# Patient Record
Sex: Male | Born: 1955 | Race: White | Hispanic: No | Smoking: Former smoker
Health system: Southern US, Community
[De-identification: ages and names within clinical notes are randomized; demographics above are authoritative.]

## PROBLEM LIST (undated history)

## (undated) DIAGNOSIS — I2510 Atherosclerotic heart disease of native coronary artery without angina pectoris: Secondary | ICD-10-CM

## (undated) DIAGNOSIS — I10 Essential (primary) hypertension: Secondary | ICD-10-CM

## (undated) DIAGNOSIS — J449 Chronic obstructive pulmonary disease, unspecified: Secondary | ICD-10-CM

## (undated) DIAGNOSIS — J45909 Unspecified asthma, uncomplicated: Secondary | ICD-10-CM

## (undated) DIAGNOSIS — E785 Hyperlipidemia, unspecified: Secondary | ICD-10-CM

## (undated) DIAGNOSIS — E669 Obesity, unspecified: Secondary | ICD-10-CM

## (undated) DIAGNOSIS — M1990 Unspecified osteoarthritis, unspecified site: Secondary | ICD-10-CM

## (undated) DIAGNOSIS — G8929 Other chronic pain: Secondary | ICD-10-CM

## (undated) DIAGNOSIS — M79606 Pain in leg, unspecified: Secondary | ICD-10-CM

## (undated) DIAGNOSIS — E039 Hypothyroidism, unspecified: Secondary | ICD-10-CM

## (undated) DIAGNOSIS — G2581 Restless legs syndrome: Secondary | ICD-10-CM

## (undated) DIAGNOSIS — F17201 Nicotine dependence, unspecified, in remission: Secondary | ICD-10-CM

## (undated) DIAGNOSIS — I679 Cerebrovascular disease, unspecified: Secondary | ICD-10-CM

## (undated) DIAGNOSIS — M549 Dorsalgia, unspecified: Secondary | ICD-10-CM

## (undated) HISTORY — DX: Hyperlipidemia, unspecified: E78.5

## (undated) HISTORY — DX: Nicotine dependence, unspecified, in remission: F17.201

## (undated) HISTORY — PX: CATARACT EXTRACTION: SUR2

## (undated) HISTORY — DX: Atherosclerotic heart disease of native coronary artery without angina pectoris: I25.10

## (undated) HISTORY — PX: LUMBAR SPINE SURGERY: SHX701

## (undated) HISTORY — DX: Restless legs syndrome: G25.81

## (undated) HISTORY — DX: Unspecified osteoarthritis, unspecified site: M19.90

## (undated) HISTORY — DX: Essential (primary) hypertension: I10

## (undated) HISTORY — DX: Hypothyroidism, unspecified: E03.9

## (undated) HISTORY — DX: Chronic obstructive pulmonary disease, unspecified: J44.9

## (undated) HISTORY — DX: Cerebrovascular disease, unspecified: I67.9

## (undated) HISTORY — DX: Obesity, unspecified: E66.9

---

## 1997-12-15 ENCOUNTER — Inpatient Hospital Stay (HOSPITAL_COMMUNITY): Admission: RE | Admit: 1997-12-15 | Discharge: 1997-12-21 | Payer: Self-pay | Admitting: Neurosurgery

## 2000-08-13 HISTORY — PX: CORONARY ARTERY BYPASS GRAFT: SHX141

## 2000-09-23 ENCOUNTER — Encounter: Payer: Self-pay | Admitting: Neurosurgery

## 2000-09-23 ENCOUNTER — Ambulatory Visit (HOSPITAL_COMMUNITY): Admission: RE | Admit: 2000-09-23 | Discharge: 2000-09-23 | Payer: Self-pay | Admitting: Neurosurgery

## 2001-02-01 ENCOUNTER — Encounter: Payer: Self-pay | Admitting: Emergency Medicine

## 2001-02-01 ENCOUNTER — Inpatient Hospital Stay (HOSPITAL_COMMUNITY): Admission: EM | Admit: 2001-02-01 | Discharge: 2001-02-11 | Payer: Self-pay | Admitting: Cardiovascular Disease

## 2001-02-02 ENCOUNTER — Encounter: Payer: Self-pay | Admitting: Thoracic Surgery (Cardiothoracic Vascular Surgery)

## 2001-02-03 ENCOUNTER — Encounter: Payer: Self-pay | Admitting: Thoracic Surgery (Cardiothoracic Vascular Surgery)

## 2001-02-04 ENCOUNTER — Encounter: Payer: Self-pay | Admitting: Thoracic Surgery (Cardiothoracic Vascular Surgery)

## 2001-02-05 ENCOUNTER — Encounter: Payer: Self-pay | Admitting: Thoracic Surgery (Cardiothoracic Vascular Surgery)

## 2001-02-06 ENCOUNTER — Encounter: Payer: Self-pay | Admitting: Thoracic Surgery (Cardiothoracic Vascular Surgery)

## 2001-02-19 ENCOUNTER — Ambulatory Visit (HOSPITAL_COMMUNITY): Admission: RE | Admit: 2001-02-19 | Discharge: 2001-02-19 | Payer: Self-pay | Admitting: Cardiology

## 2001-05-20 ENCOUNTER — Ambulatory Visit (HOSPITAL_COMMUNITY): Admission: RE | Admit: 2001-05-20 | Discharge: 2001-05-20 | Payer: Self-pay | Admitting: Cardiology

## 2002-01-23 ENCOUNTER — Encounter: Payer: Self-pay | Admitting: Emergency Medicine

## 2002-01-23 ENCOUNTER — Emergency Department (HOSPITAL_COMMUNITY): Admission: EM | Admit: 2002-01-23 | Discharge: 2002-01-23 | Payer: Self-pay | Admitting: Emergency Medicine

## 2003-06-21 ENCOUNTER — Emergency Department (HOSPITAL_COMMUNITY): Admission: EM | Admit: 2003-06-21 | Discharge: 2003-06-21 | Payer: Self-pay | Admitting: Emergency Medicine

## 2003-06-22 ENCOUNTER — Encounter: Payer: Self-pay | Admitting: Cardiology

## 2003-08-14 HISTORY — PX: CAROTID ENDARTERECTOMY: SUR193

## 2004-02-22 ENCOUNTER — Ambulatory Visit (HOSPITAL_COMMUNITY): Admission: RE | Admit: 2004-02-22 | Discharge: 2004-02-22 | Payer: Self-pay | Admitting: Ophthalmology

## 2004-04-25 ENCOUNTER — Ambulatory Visit (HOSPITAL_COMMUNITY): Admission: RE | Admit: 2004-04-25 | Discharge: 2004-04-25 | Payer: Self-pay | Admitting: Cardiology

## 2004-05-12 ENCOUNTER — Ambulatory Visit (HOSPITAL_COMMUNITY): Admission: RE | Admit: 2004-05-12 | Discharge: 2004-05-12 | Payer: Self-pay | Admitting: *Deleted

## 2004-06-14 ENCOUNTER — Encounter: Admission: RE | Admit: 2004-06-14 | Discharge: 2004-06-14 | Payer: Self-pay | Admitting: *Deleted

## 2004-06-16 ENCOUNTER — Ambulatory Visit (HOSPITAL_COMMUNITY): Admission: RE | Admit: 2004-06-16 | Discharge: 2004-06-16 | Payer: Self-pay | Admitting: *Deleted

## 2004-06-22 ENCOUNTER — Encounter (INDEPENDENT_AMBULATORY_CARE_PROVIDER_SITE_OTHER): Payer: Self-pay | Admitting: *Deleted

## 2004-06-22 ENCOUNTER — Inpatient Hospital Stay (HOSPITAL_COMMUNITY): Admission: RE | Admit: 2004-06-22 | Discharge: 2004-06-23 | Payer: Self-pay | Admitting: *Deleted

## 2005-10-23 ENCOUNTER — Ambulatory Visit: Payer: Self-pay | Admitting: Internal Medicine

## 2005-10-30 ENCOUNTER — Ambulatory Visit: Payer: Self-pay | Admitting: Internal Medicine

## 2005-10-30 ENCOUNTER — Ambulatory Visit (HOSPITAL_COMMUNITY): Admission: RE | Admit: 2005-10-30 | Discharge: 2005-10-30 | Payer: Self-pay | Admitting: Internal Medicine

## 2007-08-12 ENCOUNTER — Emergency Department (HOSPITAL_COMMUNITY): Admission: EM | Admit: 2007-08-12 | Discharge: 2007-08-12 | Payer: Self-pay | Admitting: Emergency Medicine

## 2007-09-02 ENCOUNTER — Ambulatory Visit: Payer: Self-pay | Admitting: Cardiology

## 2007-09-02 LAB — CONVERTED CEMR LAB
ALT: 29 units/L
AST: 15 units/L
Alkaline Phosphatase: 37 units/L
BUN: 22 mg/dL
CO2: 22 meq/L
Calcium: 9.4 mg/dL
Chloride: 100 meq/L
Creatinine, Ser: 1.14 mg/dL
Glucose, Bld: 127 mg/dL
HCT: 42.5 %
Hemoglobin: 14.1 g/dL
MCV: 92 fL
Platelets: 248 10*3/uL
Potassium: 5.2 meq/L
Sodium: 138 meq/L
Total Bilirubin: 0.6 mg/dL
WBC: 10.5 10*3/uL

## 2007-10-02 ENCOUNTER — Encounter: Payer: Self-pay | Admitting: Cardiology

## 2007-10-02 ENCOUNTER — Ambulatory Visit: Payer: Self-pay | Admitting: Cardiovascular Disease

## 2007-10-02 LAB — CONVERTED CEMR LAB
Cholesterol: 138 mg/dL
HDL: 38 mg/dL
LDL (calc): 66 mg/dL
Triglyceride fasting, serum: 329 mg/dL

## 2008-11-05 ENCOUNTER — Ambulatory Visit: Payer: Self-pay | Admitting: Cardiology

## 2008-11-08 ENCOUNTER — Ambulatory Visit (HOSPITAL_COMMUNITY): Admission: RE | Admit: 2008-11-08 | Discharge: 2008-11-08 | Payer: Self-pay | Admitting: Cardiology

## 2008-11-30 ENCOUNTER — Ambulatory Visit (HOSPITAL_COMMUNITY): Admission: RE | Admit: 2008-11-30 | Discharge: 2008-11-30 | Payer: Self-pay | Admitting: Family Medicine

## 2009-02-11 DIAGNOSIS — N183 Chronic kidney disease, stage 3 (moderate): Secondary | ICD-10-CM

## 2009-02-11 DIAGNOSIS — E1122 Type 2 diabetes mellitus with diabetic chronic kidney disease: Secondary | ICD-10-CM

## 2009-12-18 ENCOUNTER — Encounter: Payer: Self-pay | Admitting: Cardiology

## 2009-12-18 DIAGNOSIS — J449 Chronic obstructive pulmonary disease, unspecified: Secondary | ICD-10-CM | POA: Insufficient documentation

## 2009-12-19 ENCOUNTER — Encounter (INDEPENDENT_AMBULATORY_CARE_PROVIDER_SITE_OTHER): Payer: Self-pay | Admitting: *Deleted

## 2009-12-19 ENCOUNTER — Ambulatory Visit: Payer: Self-pay | Admitting: Cardiology

## 2009-12-22 ENCOUNTER — Encounter (INDEPENDENT_AMBULATORY_CARE_PROVIDER_SITE_OTHER): Payer: Self-pay | Admitting: *Deleted

## 2009-12-22 LAB — CONVERTED CEMR LAB
OCCULT 1: NEGATIVE
OCCULT 2: NEGATIVE
OCCULT 3: POSITIVE

## 2009-12-26 ENCOUNTER — Encounter: Payer: Self-pay | Admitting: Cardiology

## 2009-12-26 LAB — CONVERTED CEMR LAB
ALT: 20 units/L (ref 0–53)
AST: 16 units/L (ref 0–37)
Albumin: 4.6 g/dL (ref 3.5–5.2)
Alkaline Phosphatase: 25 units/L — ABNORMAL LOW (ref 39–117)
BUN: 24 mg/dL — ABNORMAL HIGH (ref 6–23)
Basophils Absolute: 0 10*3/uL (ref 0.0–0.1)
Basophils Relative: 0 % (ref 0–1)
CO2: 28 meq/L (ref 19–32)
Calcium: 9.7 mg/dL (ref 8.4–10.5)
Chloride: 103 meq/L (ref 96–112)
Cholesterol: 128 mg/dL (ref 0–200)
Creatinine, Ser: 1.44 mg/dL (ref 0.40–1.50)
Eosinophils Absolute: 0.2 10*3/uL (ref 0.0–0.7)
Eosinophils Relative: 3 % (ref 0–5)
Glucose, Bld: 89 mg/dL (ref 70–99)
HCT: 41 % (ref 39.0–52.0)
HDL: 38 mg/dL — ABNORMAL LOW (ref >39)
Hemoglobin: 12.9 g/dL — ABNORMAL LOW (ref 13.0–17.0)
LDL Cholesterol: 51 mg/dL (ref 0–99)
Lymphocytes Relative: 50 % — ABNORMAL HIGH (ref 12–46)
Lymphs Abs: 4 10*3/uL (ref 0.7–4.0)
MCHC: 31.5 g/dL (ref 30.0–36.0)
MCV: 93.8 fL (ref 78.0–100.0)
Monocytes Absolute: 0.7 10*3/uL (ref 0.1–1.0)
Monocytes Relative: 8 % (ref 3–12)
Neutro Abs: 3.1 10*3/uL (ref 1.7–7.7)
Neutrophils Relative %: 39 % — ABNORMAL LOW (ref 43–77)
Platelets: 222 10*3/uL (ref 150–400)
Potassium: 4.4 meq/L (ref 3.5–5.3)
RBC: 4.37 M/uL (ref 4.22–5.81)
RDW: 13.3 % (ref 11.5–15.5)
Sodium: 141 meq/L (ref 135–145)
Total Bilirubin: 0.3 mg/dL (ref 0.3–1.2)
Total CHOL/HDL Ratio: 3.4
Total Protein: 7.2 g/dL (ref 6.0–8.3)
Triglycerides: 193 mg/dL — ABNORMAL HIGH (ref <150)
VLDL: 39 mg/dL (ref 0–40)
WBC: 8 10*3/uL (ref 4.0–10.5)

## 2009-12-27 ENCOUNTER — Encounter (INDEPENDENT_AMBULATORY_CARE_PROVIDER_SITE_OTHER): Payer: Self-pay | Admitting: *Deleted

## 2009-12-29 ENCOUNTER — Encounter (INDEPENDENT_AMBULATORY_CARE_PROVIDER_SITE_OTHER): Payer: Self-pay | Admitting: *Deleted

## 2009-12-29 ENCOUNTER — Ambulatory Visit: Payer: Self-pay | Admitting: Cardiology

## 2010-01-06 ENCOUNTER — Ambulatory Visit: Payer: Self-pay | Admitting: Cardiology

## 2010-01-10 ENCOUNTER — Encounter (INDEPENDENT_AMBULATORY_CARE_PROVIDER_SITE_OTHER): Payer: Self-pay | Admitting: *Deleted

## 2010-01-10 LAB — CONVERTED CEMR LAB
OCCULT 1: NEGATIVE
OCCULT 2: NEGATIVE
OCCULT 3: POSITIVE

## 2010-09-14 NOTE — Letter (Signed)
Summary: [place] Results Engineer, agricultural at [facility]  [address], [place] [address]   Phone: [phone]  Fax: [phone]      Dec 29, 2009 MRN: [id number]   [name] [address] [place], [place]  [address]   Dear [name],  Your test ordered by Selena Batten has been reviewed by your physician (or physician assistant) and was found to be normal or stable. Your physician (or physician assistant) felt no changes were needed at this time.  ____ Echocardiogram  ____ Cardiac Stress Test  ____ Lab Work  ____ Peripheral vascular study of arms, legs or neck  ____ CT scan or X-ray  ____ Lung or Breathing test  __x__ Other: (stoll cards)  Your stool cards had one that was positive for blood.  This could be a false positive, due to foods that you may have eaten.  Please follow the instructions on the attached sheet and return the completed stool cards. Thank you, [name] Allyne Gee RN    [place] Bing, MD, Lenise Arena.C.Gaylord Shih, MD, F.A.C.C Lewayne Bunting, MD, F.A.C.C Nona Dell, MD, F.A.C.C Charlton Haws, MD, Lenise Arena.C.C

## 2010-09-14 NOTE — Miscellaneous (Signed)
Summary: stool cards  Clinical Lists Changes  Observations: Added new observation of HEMOCCULT 3: pos (12/22/2009 12:00) Added new observation of HEMOCCULT 2: neg (12/22/2009 12:00) Added new observation of HEMOCCULT 1: neg (12/22/2009 12:00)  spoke with pt sent new cards with special diet instruction in the mail to  patient  Appended Document: stool cards Repeat with no meat diet. Send results of this and next set of test to PMD.  [place] Bing, M.D.  Appended Document: stool cards faxed both sets to Dr. Nobie Putnam 01/12/10

## 2010-09-14 NOTE — Assessment & Plan Note (Signed)
Summary: 1 YR F/U PER CHECKOUT ON 11/05/08/TG   Visit Type:  Follow-up Primary Provider:  Dr. Patrica Duel   History of Present Illness: [name] returns to the office for continued assessment and treatment of coronary disease and cardiovascular risk factors.  Since he was last seen a year ago, he has done well from a cardiovascular standpoint.  He has not been seen in the emergency department nor has he required hospitalization.  He reports no chest discomfort nor dyspnea.  He is relatively sedentary, but does do some fishing without cardiopulmonary symptoms.  He has not had any recent laboratory studies performed.  His major complaint is of daytime somnolence and fatigue.  He sleeps poorly at night.  He was once referred for a sleep study, but was unable to fall asleep.  He reports movement of his legs at night, but does not carry a diagnosis of restless leg syndrome.  His sleep-wake cycle is essentially reversed.  He is awake most of the night and sleeps in the morning and early afternoon.  Current Medications (verified): 1)  Lisinopril 40 Mg Tabs (Lisinopril) .... Take 1 Tablet Once A Day 2)  Simvastatin 80 Mg Tabs (Simvastatin) .... Take 1 Tab Daily 3)  Aspir-Low 81 Mg Tbec (Aspirin) .... Take 1 Tab Daily 4)  Hydrocodone-Acetaminophen 10-325 Mg Tabs (Hydrocodone-Acetaminophen) .... Take As Directed 5)  Glyburide 5 Mg Tabs (Glyburide) .... Take As Needed 6)  Diazepam 10 Mg Tabs (Diazepam) .... Take 1 Tab As Needed 7)  Lexapro 20 Mg Tabs (Escitalopram Oxalate) .... Take 1 Tab Daily 8)  Levothroid 175 Mcg Tabs (Levothyroxine Sodium) .... Take 1 Tab Daily 9)  Bystolic 5 Mg Tabs (Nebivolol Hcl) .... Take 1 Tab Daily 10)  Fenofibrate 160 Mg Tabs (Fenofibrate) .... Take 1 Tab Daily 11)  Stool Softener 100 Mg Caps (Docusate Sodium) .... Take As Needed 12)  Sonata 10 Mg Caps (Zaleplon) .... Take 1 Tablet By Mouth At Bedtime  As Needed Sleep  Allergies (verified): 1)  ! Motrin 2)  !  Xanax  Past History:  PMH, FH, and Social History reviewed and updated.  Review of Systems       See history of present illness.  Vital Signs:  Patient profile:   55 year old male Height:      69 inches Weight:      285 pounds BMI:     42.24 Pulse rate:   63 / minute BP sitting:   114 / 64  (right arm)  Vitals Entered By: Dreama Saa, CNA (Dec 19, 2009 1:49 PM)  Physical Exam  General:    B.; well developed; no acute distress:   Neck-No JVD; no carotid bruits; left carotid endarterectomy scar Lungs-No tachypnea, no rales; no rhonchi; no wheezes: Cardiovascular-normal PMI; normal S1 and S2; fourth heart sound present Abdomen-BS normal; soft and non-tender without masses or organomegaly:  Musculoskeletal-No deformities, no cyanosis or clubbing: Neurologic-Normal cranial nerves; symmetric strength and tone:  Skin-Warm, no significant lesions; tattoo over the right upper arm Extremities-Nl distal pulses; no edema:     Impression & Recommendations:  Problem # 1:  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  Management will focus on optimal control of cardiovascular risk factors.  Problem # 2:  CEREBROVASCULAR DISEASE (ICD-437.9) Carotid ultrasound in 2010 showed no significant focal obstructive disease with only mild to moderate atherosclerotic changes in the vessel walls.  Continuing surveillance every 2 years or so is  appropriate.  Problem # 3:  HYPERTENSION (ICD-401.9) Blood pressure control is excellent; current medications will be continued.  I will plan to reassess this nice gentleman in one year.  Other Orders: Hemoccult Cards (Take Home) (Hemoccult Cards) Future Orders: T-Comprehensive Metabolic Panel ([id number]) ... 12/26/2009 T-Lipid Profile [phone]) ... 12/26/2009 T-CBC w/Diff ([id number]) ... 12/26/2009  Patient Instructions: 1)  Your physician recommends that you  schedule a follow-up appointment in: 1 year 2)  Your physician recommends that you return for lab work in: next week 3)  Your physician has recommended you make the following change in your medication:  sonata 10mg  at bedtime as needed for sleep 4)  Your physician has asked that you test your stool for blood. It is necessary to test 3 different stool specimens for accuracy. You will be given 3 hemoccult cards for specimen collection. For each stool specimen, place a small portion of stool sample (from 2 different areas of the stool) into the 2 squares on the card. Close card. Repeat with 2 more stool specimens. Bring the cards back to the office for testing. Prescriptions: SONATA 10 MG CAPS (ZALEPLON) Take 1 tablet by mouth at bedtime  as needed sleep  #30 x 0   Entered by:   Teressa Lower RN   Authorized by:   Kathlen Brunswick, MD, [facility]   Signed by:   Teressa Lower RN on 12/19/2009   Method used:   Print then Give to Patient   RxID:   [id number]

## 2010-09-14 NOTE — Miscellaneous (Signed)
Summary: hemmocult cards 01/06/2010  Clinical Lists Changes  Observations: Added new observation of HEMOCCULT 3: pos (01/10/2010 15:30) Added new observation of HEMOCCULT 2: neg (01/10/2010 15:30) Added new observation of HEMOCCULT 1: neg (01/10/2010 15:30) I faxed results to Dr. Tressie Stalker RN  Jan 10, 2010 4:14 PM

## 2010-09-14 NOTE — Letter (Signed)
Summary: [place] Results Engineer, agricultural at [facility]  [address] [place] [address]   Phone: [phone]  Fax: [phone]      Dec 27, 2009 MRN: [id number]   [name] [address] [place], [place]  [address]   Dear Mr. Sustaita,  Your test ordered by Selena Batten has been reviewed by your physician (or physician assistant) and was found to be normal or stable. Your physician (or physician assistant) felt no changes were needed at this time.  ____ Echocardiogram  ____ Cardiac Stress Test  __x__ Lab Work  ____ Peripheral vascular study of arms, legs or neck  ____ CT scan or X-ray  ____ Lung or Breathing test  ____ Other: No change in medical treatment at this time, per Dr. Dietrich Pates.  Enclosed is a copy of your labwork for your records.   Thank you, Roselee Tayloe Allyne Gee RN    [place] Bing, MD, Lenise Arena.C.Gaylord Shih, MD, F.A.C.C Lewayne Bunting, MD, F.A.C.C Nona Dell, MD, F.A.C.C Charlton Haws, MD, Lenise Arena.C.C

## 2010-09-14 NOTE — Letter (Signed)
Summary: [place] [facility] Work Engineer, agricultural at [facility]  [address] [place] [address]   Phone: [phone]  Fax: [phone]     Dec 19, 2009 MRN: [id number]   [name] [address] [place], [place]  [address]      YOUR LAB WORK IS DUE  MONDAY  Dec 26, 2009 _________________________________________  Please go to [facility], located across the street from [facility] on the second floor.  Hours are Monday - Friday 7am until 7:30pm         Saturday 8am until 12noon    _X_  DO NOT EAT OR DRINK AFTER MIDNIGHT EVENING PRIOR TO LABWORK  __ YOUR LABWORK IS NOT FASTING --YOU MAY EAT PRIOR TO LABWORK

## 2010-09-27 ENCOUNTER — Encounter (INDEPENDENT_AMBULATORY_CARE_PROVIDER_SITE_OTHER): Payer: Self-pay

## 2010-09-27 LAB — CONVERTED CEMR LAB
ALT: 23 units/L
AST: 15 units/L
Albumin: 4.7 g/dL
Alkaline Phosphatase: 37 units/L
BUN: 36 mg/dL
CO2: 30 meq/L
Calcium: 9.1 mg/dL
Chloride: 103 meq/L
Cholesterol: 140 mg/dL
Creatinine, Ser: 1.4 mg/dL
GFR calc non Af Amer: 53 mL/min
Glomerular Filtration Rate, Af Am: >60 mL/min/{1.73_m2}
Glucose, Bld: 101 mg/dL
HCT: 42.7 %
HDL: 34 mg/dL
Hemoglobin: 13.2 g/dL
LDL Cholesterol: 62 mg/dL
MCV: 95.1 fL
Platelets: 174 10*3/uL
Potassium: 4.8 meq/L
Sodium: 141 meq/L
Total Protein: 7.2 g/dL
Triglycerides: 221 mg/dL
WBC: 9.1 10*3/uL

## 2010-09-28 ENCOUNTER — Encounter (INDEPENDENT_AMBULATORY_CARE_PROVIDER_SITE_OTHER): Payer: Self-pay

## 2010-10-04 NOTE — Miscellaneous (Signed)
Summary: CMP w/est. GFR, CBC w/diff, and Lipids  Clinical Lists Changes  Observations: Added new observation of CALCIUM: 9.1 mg/dL (74/25/9563 87:56) Added new observation of ALBUMIN: 4.7 g/dL (43/32/9518 84:16) Added new observation of PROTEIN, TOT: 7.2 g/dL (60/63/0160 10:93) Added new observation of SGPT (ALT): 23 units/L (09/27/2010 11:11) Added new observation of SGOT (AST): 15 units/L (09/27/2010 11:11) Added new observation of ALK PHOS: 37 units/L (09/27/2010 11:11) Added new observation of LDL: 62 mg/dL (23/55/7322 02:54) Added new observation of HDL: 34 mg/dL (27/01/2375 28:31) Added new observation of TRIGLYC TOT: 221 mg/dL (51/76/1607 37:10) Added new observation of CHOLESTEROL: 140 mg/dL (62/69/4854 62:70) Added new observation of PLATELETK/UL: 174 K/uL (09/27/2010 11:11) Added new observation of MCV: 95.1 fL (09/27/2010 11:11) Added new observation of HCT: 42.7 % (09/27/2010 11:11) Added new observation of HGB: 13.2 g/dL (35/00/9381 82:99) Added new observation of WBC COUNT: 9.1 10*3/microliter (09/27/2010 11:11) Added new observation of BILI DIRECT: Bili Total: 0.4 mg/dL (37/16/9678 93:81) Added new observation of GFR AA: >60 mL/min/1.68m2 (09/27/2010 11:11) Added new observation of GFR: 53 mL/min (09/27/2010 11:11) Added new observation of CREATININE: 1.40 mg/dL (01/75/1025 85:27) Added new observation of BUN: 36 mg/dL (78/24/2353 61:44) Added new observation of BG RANDOM: 101 mg/dL (31/54/0086 76:19) Added new observation of CO2 PLSM/SER: 30 meq/L (09/27/2010 11:11) Added new observation of CL SERUM: 103 meq/L (09/27/2010 11:11) Added new observation of K SERUM: 4.8 meq/L (09/27/2010 11:11) Added new observation of NA: 141 meq/L (09/27/2010 11:11)

## 2010-12-26 NOTE — Assessment & Plan Note (Signed)
[facility]                       [place] CARDIOLOGY OFFICE NOTE   [name], [name]                     MRN:          [id number]  DATE:11/05/2008                            DOB:          July 14, 1956    CARDIOLOGIST:  Alan Friends. Dietrich Pates, MD, [facility]   PRIMARY CARE PHYSICIAN:  Alan Duel, MD   REASON FOR VISIT:  One-year followup.   HISTORY OF PRESENT ILLNESS:  [name] is a 55 year old male [place]  War veteran, who has a history of coronary artery disease status post  CABG in 2002.  He previously had cardiomyopathy with an EF of 30% that  has improved with evidence of normal ejection fraction by last  echocardiogram in September 2005.  He is also status post left carotid  endarterectomy.  He returns for annual followup today.  He denies any  significant chest pain or shortness of breath.  He describes NYHA class  II symptoms.  He denies orthopnea or PND.  He denies any significant  pedal edema.  He denies any syncope.  His cholesterol is followed by Dr.  Nobie Williamson.  He does have some problems with lower extremity pain and a  desire to move his lower extremities.  This sometimes keeps him awake at  night.   CURRENT MEDICATIONS:  Glyburide 5 mg half tablet 3 times a day,  Metformin 500 mg 3 times a day, Lisinopril 40 mg daily, Levothyroxine  0.175 mg daily, Lexapro 20 mg daily, Multivitamin daily,  Aspirin 81 mg daily, Stool softener 2 tablets daily, Simvastatin 80 mg  daily, Bystolic 5 mg daily, Avodart 0.5 mg daily, Fenofibrate 160 mg  daily, Combivent 2 puffs p.r.n.,  Vicodin p.r.n., Valium p.r.n.   ALLERGIES:  He is intolerant to LIPITOR and TRICOR.   PHYSICAL EXAMINATION:  GENERAL:  He is a well-nourished, well-developed  male in no acute distress.  VITAL SIGNS:  Blood pressure is 111/67, pulse 77, and weight 283 pounds.  HEENT:  Normal.  NECK:  Without JVD.  CARDIAC:  Normal S1 and S2.  Regular rate and rhythm.  LUNGS:  Clear to  auscultation bilaterally.  ABDOMEN:  Soft, nontender.  EXTREMITIES:  Without edema.  NEUROLOGIC:  He is alert and oriented x3.  Cranial nerves II-XII are  grossly intact.  VASCULAR:  I cannot appreciate carotid bruits bilaterally.   ASSESSMENT AND PLAN:  1. Coronary artery disease status post non-ST-elevation myocardial      infarction in June 2002 followed by subsequent coronary artery      bypass graft.  He is having no symptoms of angina at this time.  He      will continue on aspirin and his other above-stated medications.  2. Cerebrovascular disease status post left carotid endarterectomy in      2005.  He has not been assessed with carotid Dopplers since      September 2005.  We will arrange followup carotid Dopplers.  3. Dyslipidemia.  This is followed by [name].  His goal LDL was      less than or equal to 70.  4. Hypertension.  This is well controlled  on his current medications.   DISPOSITION:  The patient will be brought back in followup with Dr.  Molly Williamson in 1 year or sooner p.r.n.      Alan Newcomer, PA-C  Electronically Signed      Alan Friends. Dietrich Pates, MD, [facility]  Electronically Signed   SW/MedQ  DD: 11/05/2008  DT: 11/06/2008  Job #: [id number]   cc:   [name], M.D.

## 2010-12-26 NOTE — Letter (Signed)
September 02, 2007    Patrica Duel, M.D.  [address], [address]  [place],  [place] [address]   RE:  MANNIE, OHLIN  MRN:  [id number]  /  DOB:  Aug 29, 1955   Dear Loraine Leriche:   Mr. Centola returns to the office at your request.  I have not seen him  for approximately the past 3 years when he was lost to follow-up.  In  that interval, he has done generally well.  He was seen in the emergency  department  2 weeks ago for malaise and a sense of dread.  He attributes  this to medications he was taking that now have been changed.  He  reports an occasional twinge of chest discomfort that is unrelated to  exertion, that is momentary and that is mild.  He does not carry  nitroglycerin.  His activity is limited by chronic back problems.   He has monitored blood pressure at home but cannot exactly report the  values obtained.  He has not had any substantially high blood pressures.  He also monitor his serum glucose at home with values typically below  140.  He is unaware of any recent lipid profile.  He continues to  refrain from cigarette smoking.   Current medications include glyburide 2.5 mg t.i.d., metformin 500 mg  t.i.d., metoprolol 50 mg b.i.d., lisinopril 40 mg daily, simvastatin 40  mg daily,  Levothyroxine 0.15 mg daily, Lexapro 10 mg daily,  multivitamin, aspirin 81 mg daily, Requip 0.1 mg q.h.s..   On exam, pleasant overweight gentleman in no acute distress.  The weight  is 290, 15 pounds more than in September 2005, but 9 pounds less than in  May 2005.  Blood pressure 145/95, heart rate 72 and regular,  respirations 18.  NECK:  No jugular venous distention; left carotid endarterectomy scar;  no carotid bruits.  HEENT:  Anicteric sclerae; normal lids and conjunctiva.  ENDOCRINE:  No thyromegaly.  HEMATOPOIETIC:  No adenopathy.  LUNGS:  Clear.  CARDIAC:  Normal first and second heart sounds; fourth heart sound  present.  ABDOMEN:  Soft and nontender; normal bowel sounds; no  organomegaly.  EXTREMITIES:  Distal pulses intact; no edema.   EKG:  Technically limited tracing; normal sinus rhythm; left atrial  abnormality; delayed R-wave progression; low voltage in the limb leads.  Comparison to prior tracing of September 2004:  No significant interval  change.   IMPRESSION:  Mr. Woldt is doing well overall.  He will keep a list of  blood pressures and return to see the cardiology nurses in 1 month for  reassessment.  We will obtain basic laboratories including a lipid  profile.  Control of risk factors appears to be fairly good.  He may  need some additional antihypertensive medication.  We will make that  adjustment and plan a return office visit in 1 year.  Should his chest  discomfort worsened, become prolonged or more troublesome to him, we  could proceed with additional testing at that time.    Sincerely,      Gerrit Friends. Dietrich Pates, MD, [facility]  Electronically Signed    RMR/MedQ  DD: 09/02/2007  DT: 09/02/2007  Job #: [id number]   CC:    Payton Doughty, M.D.

## 2010-12-29 NOTE — Procedures (Signed)
NAME:  [name], [name] NO.:  [id number]   MEDICAL RECORD NO.:  [id number]          PATIENT TYPE:  OUT   LOCATION:  RAD                           FACILITY:  [facility]   PHYSICIAN:  Vida Roller, M.D.   DATE OF BIRTH:  January 27, 1956   DATE OF PROCEDURE:  05/12/2004  DATE OF DISCHARGE:                                  ECHOCARDIOGRAM   PRIMARY CARE PHYSICIAN:  Patrica Duel, M.D.   TAPE NUMBER:  [id number].   TAPE COUNT:  2502 through 3000.   INDICATIONS FOR PROCEDURE:  A 55 year old man with a cerebrovascular  accident and retinal embolism.  Previous echocardiogram in October 2002.  I  do not have the results.  The technical quality of this study is poor.   MO TRACINGS:  Aorta is 32 mm.   Left atrium is 40 mm.   Septum is 13 mm.   Posterior wall is 12 mm.   Left ventricular diastolic dimension is 43 mm.   Left ventricular systolic dimension is 31 mm.   2-D AND DOPPLER IMAGING:  The left ventricle is normal size with normal  systolic function.  There are no obvious wall motion abnormalities.  There  is mild left ventricular hypertrophy which is concentric.  Diastolic  function was not assessed.   The right ventricle is normal size with normal systolic function.   Both atria appear to be top-normal in size.  There is no evidence of an  atrial septal defect by color flow Doppler.   The aortic valve is mildly sclerotic.  There is no evidence of aortic  stenosis or regurgitation.   The mitral valve is morphologically unremarkable with no stenosis or  regurgitation.   The tricuspid valve has no regurgitation or stenosis.   Pulmonic valve not well seen.   There is no pericardial effusion.   The aorta and inferior vena cava were not well seen.   ASSESSMENT:  No obvious cause for embolic phenomenon, but a poor study to  assess this if clinically indicated, and I suspect it probably is.  A  transesophageal echocardiogram might be a reasonable study.      Trey Paula   JH/MEDQ  D:  05/12/2004  T:  05/13/2004  Job:  [id number]

## 2010-12-29 NOTE — Consult Note (Signed)
NAME:  [name], [name] NO.:  [id number]   MEDICAL RECORD NO.:  [id number]            PATIENT TYPE:   LOCATION:                                 FACILITY:   PHYSICIAN:  R. Roetta Sessions, M.D. DATE OF BIRTH:  Jan 05, 1956   DATE OF CONSULTATION:  DATE OF DISCHARGE:                                   CONSULTATION   REQUESTING PHYSICIAN:  Dr. Nobie Putnam.   PRIMARY CARE PHYSICIAN:  Dr. Quentin Angst.   CHIEF COMPLAINT:  Hemoccult-positive stools.   HISTORY OF PRESENT ILLNESS:  [name] is a 55 year old morbidly obese  Caucasian male who is followed by Dr. Rushie Goltz at the [facility] in [place]. He  was found to have Hemoccult-positive stool during screening. [name]  states he has noticed scant small volume intermittent rectal bleeding on the  toilet paper which was bright red. He suspected it was due to his  hemorrhoids. He has used suppositories for these in the past with good  relief. He denies any proctalgia or abdominal pain. He denies any history of  melena. He denies any known history of anemia. He occasionally has to take  stool softeners for hard stools but generally has a soft, brown daily bowel  movement. Denies any nausea or vomiting. He does have very rare intermittent  heartburn only with certain foods. Denies any history of indigestion,  dysphagia or odynophagia. He has been on aspirin since 2001 when he had  CABG. He has never had a screening colonoscopy.   PAST MEDICAL HISTORY:  1.  Coronary artery disease status post CABG in 2001.  2.  He has had back surgery.  3.  Left carotid endarterectomy.  4.  Bilateral cataract surgery.  5.  Morbid obesity.  6.  Asthma.  7.  Hypertension.  8.  Type 2 diabetes mellitus.  9.  Hypothyroidism.  10. CVA.  11. Depression.  12. Restless leg syndrome.   CURRENT MEDICATIONS:  1.  Vicodin 750 mg b.i.d. p.r.n.  2.  Valium 10 mg b.i.d. p.r.n.  3.  Metoprolol 50 mg b.i.d.  4.  Over-the-counter stool softeners  daily.  5.  Multivitamin daily.  6.  Aspirin 325 mg daily.  7.  Lisinopril 40 mg daily.  8.  Levothyroxine 150 mcg daily.  9.  Metformin 500 mg t.i.d.  10. Fish oil 1000 mg daily.  11. Glyburide half a tablet daily.  12. Lexapro 10 mg daily.  13. Combivent inhaler t.i.d.   ALLERGIES:  MOTRIN which causes nausea and vomiting and XANAX.   FAMILY HISTORY:  There is no known family history of colorectal carcinoma,  liver or chronic GI problems. Mother is alive and healthy. Father is  deceased in his late 93s due to brain tumor. He has one brother who is  relatively healthy.   SOCIAL HISTORY:  [name] is divorced. He has one grown healthy daughter.  He is disabled. He is a [place] veteran. He reports a 35-pack-year history  of tobacco use, quitting about five years ago. He has an occasional beer.  Denies any drug use.   REVIEW OF  SYSTEMS:  CONSTITUTIONAL:  Weight is stable. Denies any fevers or  chills. Denies any anorexia or early satiety. He does complain of some  fatigue. CARDIOVASCULAR:  Denies any chest pain or palpitations. PULMONARY:  Denies any shortness of breath, dyspnea, cough or hemoptysis.  GASTROINTESTINAL:  See HPI. ENDOCRINE:  He recently had his thyroid checked,  and his levothyroxine was increased.   PHYSICAL EXAMINATION:  VITAL SIGNS:  Weight 308 pounds, height 69 inches.  Temperature 98.2, blood pressure 112/60, and pulse of 76.  GENERAL:  [name] is a 55 year old morbidly obese Caucasian male who is  alert, oriented, pleasant and cooperative in no acute distress.  HEENT:  Sclerae are clear and nonicteric. Conjunctivae are pink. Oropharynx  pink and moist without any lesion.  NECK:  Supple without mass or thyromegaly.  CHEST:  Heart regular rate and rhythm with normal S1 and S2 without murmurs,  clicks, rubs or gallops.  LUNGS:  Clear to auscultation bilaterally.  ABDOMEN:  Protuberant with positive bowel sounds x4. No bruits auscultated.  Abdomen is  soft, nontender, nondistended without palpable mass or  hepatosplenomegaly although exam is limited given patient's body habitus.  There is no rebound tenderness or guarding.  EXTREMITIES:  Without clubbing or edema bilaterally.  RECTAL:  Deferred.  SKIN:  Pink, warm and dry. He does have white scaling plaques to his face  and around his facial hair.   ASSESSMENT:  [name] is a 55 year old Caucasian male found to have  Hemoccult-positive stool on screening. He has never had a colonoscopy, and  therefore, we will proceed with colonoscopy to rule out colorectal  carcinoma. I suspect he may have bleeding from benign anorectal source such  as hemorrhoids. He is also complaining of significant amount of fatigue  today. I suspect it may be related to his thyroid disease as his  levothyroxine dose has just been changed. However, we will check a CBC to be  sure that he is not anemic.   PLAN:  1.  Will schedule colonoscopy with Dr. Jena Gauss plus or minus EGD. I have      discussed this procedure including risks and benefits which include but      are not limited to bleeding, infection, perforation and drug reaction.      He agrees with the plan and consent will be obtained. No aspirin for      three days prior to the procedure.  2.  Will check CBC today.  3.  Further recommendations pending procedure.   We would like to thank Dr. Nobie Putnam for allowing [place] to participate in the  care of [name].      Nicholas Lose, N.P.      Jonathon Bellows, M.D.  Electronically Signed    KC/MEDQ  D:  10/23/2005  T:  10/24/2005  Job:  [id number]   cc:   Quentin Angst, M.D.  [facility]  [address], [address]  [place], [place] [address]   Patrica Duel, M.D.  Fax: [phone]

## 2010-12-29 NOTE — Consult Note (Signed)
NAME:  [name], [name] NO.:  [id number]   MEDICAL RECORD NO.:  [id number]          PATIENT TYPE:  OIB   LOCATION:  2899                         FACILITY:  [facility]   PHYSICIAN:  Janeece Riggers. Karin Golden, M.D.   DATE OF BIRTH:  1956-02-22   DATE OF CONSULTATION:  06/16/2004  DATE OF DISCHARGE:  06/16/2004                                   CONSULTATION   INDICATIONS:  Left retinal embolus.   Right internal carotid arteriogram:  This vessel was opacified via a common  carotid injection.  The carotid siphon is widely patent.  Flow from this  injection supplies the right anterior, middle, and posterior cerebral artery  territories.  There is no evidence of stenosis, aneurysm, or vascular  malformation.  Parenchymal and venous phases are normal.   Left internal carotid arteriogram:  This vessel was opacified via a common  carotid injection.  The carotid siphon is widely patent.  The anterior and  middle cerebral vessels appear normal without stenosis, aneurysm, or  vascular malformation.  Parenchymal and venous phases are normal.   IMPRESSION:  Normal intracranial anterior circulation.       MES/MEDQ  D:  06/29/2004  T:  06/29/2004  Job:  [id number]

## 2010-12-29 NOTE — Op Note (Signed)
[place]  Patient:    [name], [name]                 MRN: [id number] Proc. Date: 02/02/01 Adm. Date:  [id number] Attending:  Colon Branch CC:         Rollene Rotunda, M.D. [facility]  Patrica Duel, MD   Operative Report  PREOPERATIVE DIAGNOSIS:  Unstable postinfarct angina with severe three-vessel disease.  POSTOPERATIVE DIAGNOSIS:  Unstable postinfarct angina with severe three-vessel disease.  OPERATION PERFORMED:  Emergency median sternotomy, extracorporeal circulation. Coronary artery bypass grafting x 4 (left internal mammary artery to left anterior descending, saphenous vein graft to diagonal, sequential saphenous vein graft to acute marginal and posterior descending).  SURGEON:  Salvatore Decent. Dorris Fetch, M.D.  ASSISTANT:  Loura Pardon, P.A.  ANESTHESIA:  General.  OPERATIVE FINDINGS:  Good quality targets, good quality conduits.  Severe lung hyperinflation.  Left ventricular hypertrophy.  No graftable obtuse marginal vessels.  Obtuse marginal 2 inspected, less than 1 mm with a nearly obliterated lumen.  Lateral scar.  INDICATIONS FOR PROCEDURE:  The patient is a 55 year old gentleman with a history of obesity, tobacco abuse and a family history of coronary disease. He presented on Friday June 21 after having an out of hospital myocardial infarction.  He was pain-free at the time of admission and was admitted for medical management.  He subsequently developed unstable postinfarct angina on the morning of February 02, 2001 and was taken urgently to the cardiac catheterization lab.  There, he was found to have severe three-vessel coronary disease with tight complex proximal LAD lesion compromising a large diagonal branch, a total occlusion in his mid-LAD, and severe right coronary disease. He also had what appeared to be a chronically totalled second obtuse marginal branch of the left circumflex.  Left ventricular ejection fraction  was estimated at 30% with anterior apical akinesis.  The patient was hemodynamically stable but had ongoing chest pain.  An intra-aortic balloon pump was placed.  Surgical consultation was obtained.  I discussed with him the indications, risks, benefits and alternative treatments.  He understood and accepted the risks of surgery and agreed to proceed.  He understood there was high risk secondary to his pulmonary status, recent MI and obesity as well as his heparin and Integrilin.  He accepted the risks and agreed to proceed.  DESCRIPTION OF PROCEDURE:  The patient was brought directly from the cardiac catheterization lab to the operating room.  An intra-aortic balloon pump was in place, one to one.  A Swan-Ganz catheter was placed as well as radial arterial blood pressure monitoring line.  The patient was anesthetized and intubated.  A Foley catheter was placed.  Intravenous antibiotics were administered.  The chest, abdomen and legs were prepped and draped in the usual fashion.  A median sternotomy was performed and the left internal mammary artery was harvested in the standard fashion.  This was a difficult harvest, secondary to bleeding as well as the patients body habitus but the mammary was a good-sized vessel.  Simultaneously an incision was made in the medial aspect of the left leg and the greater saphenous vein was harvested from the ankle to the lower thigh.  Both the saphenous vein and mammary artery were good quality conduits.  The patient was fully heparinized prior to dividing the distal end of the mammary artery.  There was good flow through the cut end of the graft. The mammary was placed in a papaverine soaked sponge  after ensuring that all side branches had been clipped and placed into the left pleural space.  The pericardium was opened.  The ascending aorta was inspected and palpated. It was of normal size.  There was no palpable atherosclerotic disease.  The aorta was  cannulated via concentric 2-0 Ethibond pledgeted pursestring sutures.  A dual stage venous cannula was placed via a pursestring suture in the right atrial appendage.  Cardiopulmonary bypass was instituted and the patient was cooled to 32 degrees Celsius.  The coronary arteries were inspected and anastomotic sites were chosen.  The conduits were inspected and cut to length.  A foam pad was placed in the pericardium to protect the left phrenic nerve.  A temperature probe was placed in the myocardial septum and the cardioplegia cannula was placed in the ascending aorta.  Of note, on the coronary artery inspection, OM1 and 3 were clearly too small to graft.  OM2 was small but it did appear that it might be graftable.  A retrograde cardioplegia cannula was placed via pursestring suture in the right atrium. Positioning was confirmed with palpation of the tip as well as coronary sinus wedge pressure increasing with balloon inflation.  The aorta was crossclamped.  The left ventricle was emptied via the aortic root vent.  Cardiac arrest then was achieved with a combination of cold antegrade and retrograde blood cardioplegia and topical iced saline.  One liter of cardioplegia was administered.  Myocardial septal temperature was 9 degrees Celsius.  The following distal anastomoses were performed.  First a reversed saphenous vein graft was placed sequentially to the acute marginal and posterior descending.  The graft to the acute marginal was a side-to-side anastomosis using a running 7-0 Prolene suture.  It was a 1.5 mm good quality target.  There was good flow through this portion of the graft. The distal end was cut to length and anastomosed end-to-side to the posterior descending coronary artery which was a 1.8 mm good quality target.  This also was performed with a running 7-0 Prolene suture.  At the completion of the second anastomosis, cardioplegia was administered.  There was good  hemostasis  at both anastomoses.  There was excellent flow through the graft.  Next a vein was prepared for sequential grafting to the large anterolateral diagonal branch as well as OM2.  A side branch of the vein graft was prepared and was anastomosed side-to-side to the diagonal using a running 7-0 Prolene suture.  The diagonal was a 2 mm good quality target.  Next, the OM2 was inspected.  An arteriotomy was performed but the coronary was essentially obliterated.  A 1 mm probe passed a short distance proximally.  The 1 mm probe would go into the artery distally but would not pass.  An incision was made more distally over the course of the vessel through the myocardium and essentially, the artery was near completely obliterated.  Therefore the decision was made to abandon attempts at grafting this.  The incision in the myocardium was closed with a running 6-0 Prolene suture.  The anastomosis to the diagonal was taken down.  The vein graft was cut to length.  At this point, additional cardioplegia was administered.  The vein was reprepared and was anastomosed end-to-side to the diagonal using a running 7-0 Prolene suture.  There was excellent flow through this graft.  Cardioplegia was administered via both vein grafts.  Next the left internal mammary artery was brought through a window in the pericardium and the distal  end was spatulated.  The mammary was a 2.5 mm good quality conduit with excellent flow.  It was anastomosed end-to-side to the distal LAD, which was a 1.8 mm vessel at the sight of the anastomosis and a 1.5 mm probe passed distally to the apex.  This anastomosis was performed with a running 8-0 Prolene suture.  Note, the LAD distal to the site of the anastomosis was intramyocardial.  The anastomosis was performed with a running 8-0 Prolene suture.  At the completion of the mammary to LAD anastomosis, the bulldog clamp was removed from the mammary artery.  Immediate and  rapid septal rewarming was noted.  Lidocaine was administered.  There was a small leak from the site of the anastomosis repaired with a single 8-0 Prolene suture.  The mammary pedicle was tacked to the epicardial surface of the heart with a 6-0 Prolene suture and the aortic crossclamp was removed.  The total crossclamp time was 88 minutes.  The patient spontaneously resumed rhythm and did not require defibrillation. A partial occlusion clamp was placed on the ascending aorta.  The cardioplegia cannulas were removed.  The vein grafts were cut to length and the proximal vein graft anastomoses were performed to 4.0 mm punch aortotomies with running 6-0 Prolene sutures.  At the completion of the final proximal anastomosis, the patient was placed in Trendelenburg position and air was allowed to vent as the partial clamp was removed prior to tying the suture.  Air was then aspirated from both the vein grafts.  The bulldog clamps were removed and flow was restored.  All proximal and distal anastomoses were inspected for hemostasis.  Epicardial pacing wires were placed on the right ventricle and right atrium.  The retrograde cardioplegia cannula was removed.  When the core temperature reached 37 degrees Celsius, the patient was weaned from cardiopulmonary bypass.  The intra-aortic balloon pump was placed on at one to one.  A dopamine drip was initiated at 3 mcg/kg.  Initial attempt to wean from bypass, the patient had low blood pressure and bypass was reinstituted.  The dopamine drip was increased to approximately 7 mcg/kg.  The patient was allowed to rest for several minutes and then the balloon pump was placed back on at one to one and the patient weaned from bypass without difficulty.  Total bypass time was 155 minutes.  Initial cardiac index was approximately 2L per minute per meter squared with volume resuscitation.  The index then increased to almost 3L per minute per meter squared.  A test  dose of protamine was administered and was well tolerated.  The atrial and aortic cannulae were removed.  There was good hemostasis at both cannulation sites.  The remainder of the protamine was administered without incident.  The chest was irrigated with 1L of warm normal saline containing 1 gm of vancomycin.  Hemostasis was achieved and was fair.  A left pleural and two mediastinal chest tubes were placed through separate subcostal incisions.  The pericardium could not be reapproximated.  The mediastinal fat was brought over the heart and grafts to protect them from adhesions to the sternum with interrupted 3-0 silk sutures. The sternum was closed with heavy gauge double stainless steel wires.  The pectoralis fascia was closed with running #1 Vicryl suture.  The subcutaneous tissues were closed with running 2-0 Vicryl suture and the skin was closed with a 3-0 Vicryl subcuticular suture.  A Blake drain was left in the leg incision and it was closed in a  similar fashion.  All sponge, needle and instrument counts were correct at the end of the procedure.  The patient remained hemodynamically stable and was taken from the operating room to the surgical intensive care unit intubated in critical condition. DD:  02/02/01 TD:  02/03/01 Job: [id number]

## 2010-12-29 NOTE — Op Note (Signed)
NAME:  [name], [name] NO.:  [id number]   MEDICAL RECORD NO.:  [id number]          PATIENT TYPE:  INP   LOCATION:  2899                         FACILITY:  [facility]   PHYSICIAN:  Balinda Quails, M.D.    DATE OF BIRTH:  [birth year]   DATE OF PROCEDURE:  06/22/2004  DATE OF DISCHARGE:                                 OPERATIVE REPORT   SURGEON:  Balinda Quails, M.D.   ASSISTANT:  Eber Jones A. Eustaquio Boyden.   ANESTHESIA:  General endotracheal anesthesia.   ANESTHESIOLOGIST:  Zenon Mayo, M.D.   PREOPERATIVE DIAGNOSES:  1.  Severe left internal carotid artery stenosis.  2.  Left retinal emboli.   POSTOPERATIVE DIAGNOSES:  1.  Severe left internal carotid artery stenosis.  2.  Left retinal emboli.   PROCEDURE:  Left carotid endarterectomy with Dacron patch angioplasty.   CLINICAL NOTE:  The patient is a 55 year old male with a history of coronary  artery disease status post coronary artery bypass grafting.  He was recently  noted to have decreased vision in his left eye. Ophthalmology evaluation  revealed evidence of left retinal emboli.  Workup for this included carotid  Doppler which revealed bilateral carotid plaque with moderate left internal  carotid artery stenosis.  Arteriography was carried out and this revealed a  deeply ulcerated plaque in the left internal carotid artery.  The patient is  brought to the operating room at this time for left carotid endarterectomy.  The risks of the operative procedure were explained to the patient and  family.  Major morbidity and mortality associated with this procedure is 1  to 2% to include, but limited to, MI, cerebrovascular accident, cranial  nerve injury and death.   DESCRIPTION OF PROCEDURE:  The patient was brought to the operating room in  stable condition. He was placed in a supine position.  General endotracheal  anesthesia was induced.  A Foley catheter and arterial line were in place.  The left neck  was prepped and draped in a sterile fashion.   A curvilinear skin incision was made along the anterior border of the left  sternomastoid muscle.  Dissection was carried down through the subcutaneous  tissue. The platysma was incised.  Deep dissection was carried along the  sternomastoid to the carotid sheath.  The common carotid artery was  mobilized proximally down to the omohyoid muscle and encircled with a vessel  loop.  The vagus nerve was reflected posteriorly and preserved.  Distal  dissection was carried up to the bifurcation where the superior thyroid and  external carotid were encircled with a vessel loop. The distal internal  carotid artery was then mobilized up to the digastric muscle and encircled  with a vessel loop.  The hypoglossal nerve and vagus nerve were both freed  and preserved.   The carotid bifurcation did reveal plaque disease extending well into the  origin of the left internal carotid artery.  The patient was administered  7,000 units of heparin intravenously.   The carotid vessel was controlled with clamps.  A longitudinal arteriotomy  was made in the  distal common carotid artery.  The arteriotomy extended  across the carotid bulb and up into the internal carotid artery.  There was  extensive degenerative plaque with ulceration and friable atheroma.  The  arteriotomy extended into the internal carotid artery beyond the plaque  disease. A shunt was then inserted.   An endarterectomy elevator was used to remove the plaque.  The  endarterectomy carried down into the common carotid artery where the plaque  was divided transversely with Potts scissors.  The plaque was then raised up  into the bulb where the superior thyroid and external carotid were  endarterectomized using an eversion technique.   The distal internal carotid artery plaque was feathered out well.  Fragments  of plaque were removed with plaque forceps.  The site was irrigated with  heparin saline  solution.   A Dacron patch was then placed over the endarterectomy site using running 6-  0 Prolene suture.  On completion of the patch angioplasty the shunt was  removed.  All vessels were well flushed.  The clamps were removed directly  and there was still antegrade up the external carotid artery and following  this the internal carotid artery was released.   Excellent pulse and Doppler signal in the distal internal carotid artery.   The patient was administered 50 mg of Protamine intravenously. Adequate  hemostasis was obtained.  Sponge and instrument counts were correct.   The sternomastoid fascia was closed using running 2-0 Vicryl suture.  The  platysma was closed with running 3-0 Vicryl suture.  The skin was closed  with 4-0 Monocryl and Steri-Strips were applied.   The patient tolerated the procedure well.  He was transferred to the  recovery room in stable condition.       PGH/MEDQ  D:  06/22/2004  T:  06/22/2004  Job:  [id number]

## 2010-12-29 NOTE — Op Note (Signed)
NAME:  [name], [name] NO.:  [id number]   MEDICAL RECORD NO.:  [id number]          PATIENT TYPE:  INP   LOCATION:  3315                         FACILITY:  [facility]   PHYSICIAN:  Balinda Quails, M.D.    DATE OF BIRTH:  1956-03-31   DATE OF PROCEDURE:  06/22/2005  DATE OF DISCHARGE:  06/23/2004                                 OPERATIVE REPORT   DIAGNOSES:  1.  Extracranial cerebrovascular occlusive disease.  2.  A left retinal emboli.   PROCEDURE:  1.  Arch aortogram.  2.  Bilateral selective carotid arteriograms.   ACCESS:  Right common femoral artery 5-French sheath.   CONTRAST:  Visipaque 105 ml.   COMPLICATIONS:  None apparent.   CLINICAL NOTE:  [name] is a 55 year old disabled male referred for  evaluation of abnormal carotid Dopplers and evidence of left retinal emboli.  Doppler evaluation reveals bilateral carotid bifurcation plaque. Velocities  in his left internal carotid artery are consistent with a 50-69% stenosis.  The patient is brought to the cath lab at this time for diagnostic  arteriography.   PROCEDURE NOTE:  The patient brought to the cath lab in stable condition.  Placed in supine position. Both groins prepped and draped in sterile  fashion. Right groin instilled with 1% Xylocaine. The right common femoral  artery accessed without difficulty. A 0.035 Wholey guidewire passed through  the needle. The needle removed, a 5-French sheath advanced over the  guidewire, and the dilator removed. The sheath flushed with heparin saline  solution.   The guidewire was then advanced into the aortic arch. A pigtail catheter was  advanced over the guidewire. Then 40 degrees LAO projection arch aortogram  obtained. The innominate artery was widely patent. The left common carotid  and subclavian carotid origins were also widely patent. The right subclavian  artery was normal in caliber. The right vertebral artery was noted to be  large with antegrade  flow. The left vertebral artery was small and atretic.   The proximal right subclavian arteries were patent bilaterally.   Exchange was then carried out for an H1 catheter. The H1 catheter brought up  into the aortic arch. This was engaged into the innominate artery. The  guidewire advanced into the right common carotid artery. Catheter passed  over the guidewire, and right carotid arteriography obtained with cervical  and cerebral views. Cervical right carotid arteriography revealed moderate  posterior plaque at the origin of the right internal carotid artery with  moderate-to-deep ulceration. There was no significant stenosis. The external  carotid artery was widely patent.   The H1 catheter was then disengaged from the right common carotid and  innominate and passed into the left common carotid artery. Left carotid  arteriography obtained with cervical and intracranial views. The left  cervical carotid arteriogram revealed approximately a 50% left internal  carotid artery stenosis with a deep ulcer.  The external carotid artery was  widely patent.   This completed the arteriogram procedure. Intracranial views dictated under  separate heading by neuroradiology. There were no apparent complications.  The right femoral sheath removed.  The patient transferred to the holding  area in stable condition.   FINAL IMPRESSION:  1.  Normal arch aortogram.  2.  Moderate right carotid bifurcation plaque with ulceration and no      significant stenosis.  3.  Moderate left internal carotid artery plaque with deep ulceration and a      50% left internal carotid artery origin stenosis.   DISPOSITION:  These results been reviewed with the patient's family. The  patient has symptomatic left carotid stenosis and will be scheduled for left  carotid endarterectomy.      PGH/MEDQ  D:  09/15/2004  T:  09/15/2004  Job:  [id number]   cc:   Casco Bing, M.D.   Patrica Duel, M.D.  [address]  [place]  [place] [address]  Fax: [phone]   [facility]

## 2010-12-29 NOTE — Cardiovascular Report (Signed)
[place]. [facility]  Patient:    [name], [name] Visit Number: [id number] MRN: [id number]          Service Type: MED Location: 2300 [id number] Attending Physician:  Colon Branch Proc. Date: 02/02/01 Admit Date:  02/01/2001   CC:         Salvatore Decent. Dorris Fetch, M.D.             Rollene Rotunda, M.D. [facility]             Patrica Duel, M.D.                        Cardiac Catheterization  PROCEDURES PERFORMED: 1. Left heart catheterization. 2. Left ventriculogram. 3. Selective coronary angiography. 4. Selective angiography of left subclavian internal mammary artery. 5. Abdominal aortogram. 6. Placement of intra-aortic balloon pump.  DIAGNOSES: 1. Three-vessel coronary artery disease. 2. Acute anterolateral wall myocardial infarction. 3. Moderate left ventricular systolic dysfunction. 4. Postinfarction angina.  HISTORY:  [name] is a 55 year old white male with multiple cardiac risk factors, who presented to the hospital with substernal chest discomfort.  The patient was subsequently ruled in for non-Q wave myocardial infarction and was found to have loss of R-waves in the anterolateral leads.  He was stabilized medically, however, he developed recurrent chest discomfort that was refractory to medical management.  He was brought to the catheterization laboratory urgently for further assessment.  TECHNIQUE:  Informed consent was obtained.  The patient was brought to the catheterization laboratory.  A 7-French sheath was placed in the right femoral artery and left heart catheterization and selective angiography were then performed in the usual fashion using preformed 6-French Judkins catheters.  An abdominal aortogram was performed using power injections through the pigtail catheter and a left subclavian angiogram was performed using JR-4 catheter to visualize the internal mammary artery.  At the termination of the case, an intra-aortic  balloon pump was positioned in the descending aorta and seen to function well under fluoroscopy.  This was set at 1:1.  The patient tolerated the procedure well and remained hemodynamically stable and had relief of discomfort with placement of intra-aortic balloon pump.  He was transferred to the operating room in stable condition.  FINDINGS: 1. Left main trunk:  This begins as a large caliber vessel and has a distal    taper of approximately 60%. 2. Left anterior descending:  This begins as a large caliber vessel and    provides a large bifurcating first diagonal branch in the proximal segment.    The mid LAD is then seen via antegrade flow.  The ostial LAD has a    high-grade narrowing of 99% with probable thrombus and vessel disruption.    There is then diffuse disease of 60% in the mid section.  The apical LAD is    seen to fill via collaterals from the right coronary artery and appears    to have moderate diffuse disease.  The first diagonal branch has mild    irregularities. 3. Left circumflex artery:  This is a medium caliber vessel that provides a    trivial first marginal branch in the proximal segment, a second marginal    branch in the mid section, and a small third marginal branch distally.  The    AV circumflex has an ostial narrowing of 50%.  There is then focal    narrowing of 50% to 60% in the mid AV circumflex, after  the second marginal    branch.  The first marginal branch, as noted, is a small caliber vessel.    The second marginal branch is occluded proximally.  Very faint filling is    noted distally and this appears to be a small caliber vessel.  The third    marginal branch has mild irregularities. 4. Right coronary artery:  Dominant.  This is a medium caliber vessel that    provides a posterior descending artery and a posteroventricular branch in    the terminal segment.  There is mild diffuse disease in the proximal mid    section of the right coronary artery.   There is then a focal narrowing of    70% in the mid section at the takeoff of an RV marginal branch.  The distal    RCA has mild diffuse disease of 30.  The RV marginal branch has a    high-grade narrowing of 70% at its origin. 5. Left subclavian artery:  Patent. 6. Internal mammary artery:  Patent and extends to the diaphragm. 7. Left ventricle:  Mildly dilated end-systolic and end-diastolic dimensions.    Overall left ventricular function is moderately impaired. Ejection fraction    is approximately 30%.  There is akinesis of the anterior and apical walls.    No mitral regurgitation is noted.  LV pressure 150/20, aortic 150/100.    LV-EDP 38. 8. Abdominal aorta:  Normal caliber.  There is mild atheromatous buildup.  The    renal arteries are single and patent bilaterally, with only mild disease of    30% in the left renal artery.  The iliac arteries are patent with moderate    disease of 30% in the proximal segments.  ASSESSMENT AND PLAN:  [name] is a 55 year old gentleman with severe three-vessel coronary artery disease and moderate left ventricular systolic function, who presents with ongoing anterolateral myocardial infarction with chest discomfort refractory to medical therapy.  The patient has now been stabilized with an intra-aortic balloon pump.  A surgical consultation was obtained and it was felt that despite the high-risk nature of the patients situation, coronary artery bypass graft surgery would be necessary for immediate stabilization of the patients chest discomfort and for long-term survival benefit. Attending Physician:  Colon Branch DD:  02/02/01 TD:  02/02/01 Job: [id number]

## 2010-12-29 NOTE — Op Note (Signed)
NAME:  TERIQUE, KAWABATA NO.:  [id number]   MEDICAL RECORD NO.:  [id number]          PATIENT TYPE:  AMB   LOCATION:  DAY                           FACILITY:  [facility]   PHYSICIAN:  R. Roetta Sessions, M.D. DATE OF BIRTH:  July 10, 1956   DATE OF PROCEDURE:  10/30/2005  DATE OF DISCHARGE:                                 OPERATIVE REPORT   PROCEDURE:  Diagnostic colonoscopy.   INDICATIONS FOR PROCEDURE:  The patient is a 55 year old Caucasian male who  was found to be Hemoccult positive recently. He has had a slight amount of  blood on the toilet tissue when he wipes. He has not had any gross  hematochezia, melena. Aside from these symptoms, really has no GI symptoms.  Certainly does not have any upper GI symptoms. There is no family history of  colorectal neoplasia. He has never had his lower GI tract imaged.  Colonoscopy is now being done. This approach has been discussed with the  patient at length. Potential risks, benefits, and alternatives have been  reviewed. From October 24, 2005, he had a CBC:  White count 8.6, H and H 12.4  and 38.7, MCV 98.5. Iron studies were pending. Colonoscopy is now being  done. This approach has been discussed with the patient at length. Potential  risks, benefits, and alternatives have been reviewed and questions answered.  He is agreeable. Please see documentation in the medical record.   PROCEDURE NOTE:  O2 saturation, blood pressure, pulse, and respirations were  monitored throughout the entire procedure. Conscious sedation with Versed 6  mg IV and Demerol 150 mg IV in divided doses.   INSTRUMENT:  Olympus video chip system.   FINDINGS:  Digital rectal exam revealed no abnormalities.   ENDOSCOPIC FINDINGS:  Prep was good.   Rectum:  Examination of the rectal mucosa including retroflexed view of the  anal verge revealed anal papilla and anal canal hemorrhoids. Rectal mucosa  otherwise appeared normal.   Colon:  Colonic mucosa was  surveyed from the rectosigmoid junction through  the left, transverse, and right colon to the area of the appendiceal  orifice, ileocecal valve, and cecum. These structures were well seen and  photographed for the record. From this level, the scope was slowly  withdrawn, and all previously mentioned mucosal surfaces were again seen.  The colonic mucosa appeared normal. The patient tolerated the procedure well  and was reactive to endoscopy.   IMPRESSION:  Anal papilla and hemorrhoids. Otherwise normal rectum, normal  colon.   RECOMMENDATIONS:  1.  Hemorrhoid literature provided to Mr. Pieczynski.  2.  Ten-day course of Anusol HC suppositories 1 per rectum at bedtime.  3.  Follow up on pending iron studies.  4.  Further recommendations to follow.      Jonathon Bellows, M.D.  Electronically Signed     RMR/MEDQ  D:  10/30/2005  T:  10/31/2005  Job:  [id number]   cc:   Patrica Duel, M.D.  Fax: [phone]   [facility]  [address]  [address]  [place], [place]

## 2010-12-29 NOTE — Consult Note (Signed)
[place]. [facility]  Patient:    [name], [name]                 MRN: [id number] Proc. Date: 02/02/01 Adm. Date:  [id number] Attending:  Colon Branch CC:         Veneda Melter, M.D.  Jonell Cluck, M.D.   Consultation Report  0REASON FOR CONSULTATION:  Ongoing chest pain with severe three-vessel coronary disease.  HISTORY OF PRESENT ILLNESS:  [name] is a 55 year old white male who is disabled secondary to back pain and has a history of heavy tobacco abuse.  He presented with progressive shortness of breath and substernal chest pain.  His pain had started three days prior to admission.  On arrival to the emergency room, he had an elevated troponin and poor R wave progression on his EKG with no acute changes.  He was admitted for treatment of an out of hospital myocardial infarction.  He remained stable, until this morning, when he developed severe, 10/10, chest pain with radiation to the left arm accompanied by nausea and anxiety.  EKG showed possible worsening of poor R-wave progression across the precordium.  He was take emergently to the cardiac catheterization laboratory, where he was found to have severe three-vessel disease.  PAST MEDICAL HISTORY:  Disabling leg and back pain, status post fusion of L3-4.  Lower extremity weakness secondary above.  Anxiety disorder.  MEDICATIONS ON ADMISSION: 1. Valium 10 mg p.o. b.i.d. 2. Vicodin ES p.r.n.  ALLERGIES:  MOTRIN.  FAMILY HISTORY:  Positive for father with early MI.  SOCIAL HISTORY:  He is disabled.  He smokes two packs a day.  REVIEW OF SYSTEMS:  Not done secondary to patient and chest pain on the catheterization table.  PHYSICAL EXAMINATION:  GENERAL:  [name] is a 55 year old obese white male.  He is anxious and complaining of chest pain.  HEENT:  Unremarkable.  NECK:  Without bruits, thyromegaly, or adenopathy.  CHEST:  Expiratory wheezes  bilaterally.  EXTREMITIES:  The patient has a balloon pump in the right groin.  The remainder of the exam was truncated because the patient is prepped and draped.  LABORATORY DATA:  His CK was 468, MB 56, troponin 5.6.  EKG shows T-wave abnormality laterally and poor R-wave progression across the precordium.  White count 14.2, hematocrit 43, platelet count 259.  Chemistry results are not available.  Chest x-ray is not available.  Cardiac catheterization is described in the HPI.  IMPRESSION:  [name] is a 55 year old white male.  He presented with an out of hospital myocardial infarction and now has unstable post-infarct angina with ongoing chest pain.  Cardiac catheterization revealed severe three-vessel disease not amenable to percutaneous intervention.  The best chance of survival and myocardial preservation is with emergent bypass surgery.  Surgery is high risk, given his current status being post infarct with acute ischemia and with active wheezing; however, percutaneous intervention is unlikely to be successful in reestablishing adequate blood flow and he continues to have pain despite maximal medical therapy.  Therefore, the risk of surgery is justified. I discussed with the patient the indications, risks, benefits, and alternatives.  He understands the risk including death, stroke, myocardial infarction, bleeding, likely need for blood transfusions, infections, and other organ system dysfunction, particularly respiratory.  He accepts the risks and agrees to proceed.  I discussed with him the operative approach. Intra-aortic balloon pump was placed by Dr. Chales Abrahams for stabilization prior to transport to  the operating room.  The OR has been notified and we will take the patient to the operating room as soon as possible.  His family is en route.  We will meet with them if they arrive before surgery. DD:  02/02/01 TD:  02/03/01 Job: [id number]

## 2010-12-29 NOTE — Discharge Summary (Signed)
[place]. [facility]  Patient:    [name], [name]                 MRN: [id number] Adm. Date:  [id number] Disc. Date: 02/11/01 Attending:  Colon Branch Dictator:   Dominica Severin, P.A. CC:         Veneda Melter, M.D.  Jonell Cluck, M.D.  Gerrit Friends. Dietrich Pates, M.D. [facility]   Discharge Summary  ADMISSION DIAGNOSES: 1. Chest pain with out of hospital myocardial infarction. 2. Debilitating leg and back pain, status post fusion of L3-L4. 3. Lower extremity weakness secondary to above. 4. Anxiety. 5. Allergy to Motrin causing nausea and vomiting. 6. Heavy tobacco abuse. 7. Family history of coronary artery disease, premature.  DISCHARGE DIAGNOSES: 1. Three vessel coronary artery disease with unstable postinfarct angina    requiring intra-aortic balloon pump and emergent coronary artery bypass    graft. 2. Postoperative volume overload, resolved. 3. Postoperative anemia requiring two transfusions. 4. Postoperative hypotension requiring prolonged use of dopamine which is now    resolved. 5. Brief burst of atrial fibrillation to return to normal sinus rhythm not    requiring medical treatment. 6. Postoperative oxygen dependence and wheezing.  PROCEDURES: 1. On February 02, 2001, cardiac catheterization. 2. On February 02, 2001, emergent coronary artery bypass graft x 4 with the    following grafts placed:  Left internal mammary artery to the left anterior    descending; sequential saphenous vein graft to the acute marginal and    posterior descending artery; saphenous vein graft to the diagonal branch. 3. Blood transfusions on June 26 and June 28.  HISTORY OF PRESENT ILLNESS:  This patient is a 55 year old, Caucasian male who has a past medical history of being disabled secondary to back pain.  He has a history of heavy tobacco abuse.  He had presented with progressive shortness of breath and substernal chest pain as pain started three days prior  to admission.  He does have a family history of coronary artery disease.  It was determined that the patient had an out of hospital myocardial infarction.  He was pain free at the time of admission and was admitted for medical management.  Subsequently, he developed unstable postinfarct angina the morning of February 02, 2001, and was taken urgently to the cardiac catheterization lab.  There he was found to have a severe three-vessel coronary artery disease and LV ejection fraction estimated at 30% with anterior apical akinesis.  The patient was hemodynamically stable, but had ongoing chest pain and anterior intra-aortic balloon pump was placed. Surgical consultation was obtained by Dr. Dorris Fetch.  It was determined that the patient would benefit from coronary vascularization.  Risks of the procedure, benefits, risks and alternative treatments were reviewed with the patient and the patient agreed to proceed.  HOSPITAL COURSE:  The patient underwent surgery as stated above on February 02, 2001.  He tolerated the procedure well.  His postoperative course was notable for some postoperative volume overload which has now resolved.  Postoperative anemia requiring transfusions x 2.  Postoperative hypotension requiring prolonged use of dopamine which is now resolved.  He did have a brief burst of atrial fibrillation on June 27, which returned back to sinus rhythm within one minute.  The patient was not requiring any medical treatment for this arrhythmia.  He also had a postoperative oxygen dependence and wheezing as well as desaturation with activity which was prolonged postoperatively, but has since resolved.  Nevertheless, he  did not have any other cardiac or respiratory complications.  He was ambulated daily by cardiac rehabilitation phase I and tolerated that well.  He is anticipated for discharge on the morning of February 11, 2001.  CONDITION ON DISCHARGE:  Stable and improved.  DISCHARGE  MEDICATIONS: 1. Coated aspirin 325 mg daily. 2. Percocet one to two tablets as needed for pain every four hours. 3. Toprol XL 25 mg daily. 4. Lasix 40 mg daily x 5. 5. Potassium chloride 20 mEq daily. 6. Valium 10 mg b.i.d. p.r.n. 7. Combivent inhaler two puffs b.i.d.  ACTIVITY:  The patient is instructed not to do any driving, to avoid any strenuous activity or heavy lifting.  Continue to walk daily and as tolerated. Continue breathing exercises.  DIET:  Low fat, low sodium diet.  SPECIAL INSTRUCTIONS:  He was told he could shower and notify the office if any wound problems arise as noted on the fax sheet.  The patient was given cardiac surgery fax sheet.  FOLLOWUP:  Follow-up appointment with Dr. Dietrich Pates in [place] on February 19, 2001, at 11 a.m.  He is to have chest x-ray at that time.  He has a follow-up appointment with Dr. Dorris Fetch on March 05, 2001, at 1:15 p.m.  He is also to follow up with Dr. Nobie Putnam in approximately four to six weeks.  He is to bring his chest x-ray from Dr. Langston Masker office to his appointment with Dr. Dorris Fetch. DD:  02/10/01 TD:  02/10/01 Job: [id number]

## 2011-01-12 ENCOUNTER — Ambulatory Visit: Payer: Self-pay | Admitting: Cardiology

## 2011-01-16 DIAGNOSIS — E039 Hypothyroidism, unspecified: Secondary | ICD-10-CM | POA: Insufficient documentation

## 2011-01-16 DIAGNOSIS — E785 Hyperlipidemia, unspecified: Secondary | ICD-10-CM

## 2011-01-16 DIAGNOSIS — E782 Mixed hyperlipidemia: Secondary | ICD-10-CM | POA: Insufficient documentation

## 2011-01-16 DIAGNOSIS — I10 Essential (primary) hypertension: Secondary | ICD-10-CM

## 2011-01-16 DIAGNOSIS — IMO0001 Reserved for inherently not codable concepts without codable children: Secondary | ICD-10-CM | POA: Insufficient documentation

## 2011-01-16 DIAGNOSIS — F17201 Nicotine dependence, unspecified, in remission: Secondary | ICD-10-CM | POA: Insufficient documentation

## 2011-01-16 DIAGNOSIS — Z720 Tobacco use: Secondary | ICD-10-CM

## 2011-01-16 DIAGNOSIS — I679 Cerebrovascular disease, unspecified: Secondary | ICD-10-CM | POA: Insufficient documentation

## 2011-01-16 DIAGNOSIS — M1990 Unspecified osteoarthritis, unspecified site: Secondary | ICD-10-CM

## 2011-01-16 DIAGNOSIS — E669 Obesity, unspecified: Secondary | ICD-10-CM

## 2011-01-16 DIAGNOSIS — G2581 Restless legs syndrome: Secondary | ICD-10-CM

## 2011-01-16 DIAGNOSIS — I2510 Atherosclerotic heart disease of native coronary artery without angina pectoris: Secondary | ICD-10-CM

## 2011-01-17 ENCOUNTER — Ambulatory Visit: Payer: Self-pay | Admitting: Cardiology

## 2011-01-29 ENCOUNTER — Encounter: Payer: Self-pay | Admitting: Adult Health

## 2011-01-29 NOTE — Progress Notes (Signed)
Cancelled.  

## 2011-02-19 ENCOUNTER — Other Ambulatory Visit: Payer: Self-pay | Admitting: Cardiology

## 2011-02-26 ENCOUNTER — Ambulatory Visit (INDEPENDENT_AMBULATORY_CARE_PROVIDER_SITE_OTHER): Payer: Self-pay | Admitting: Cardiology

## 2011-02-26 ENCOUNTER — Encounter: Payer: Self-pay | Admitting: Cardiology

## 2011-02-26 DIAGNOSIS — I10 Essential (primary) hypertension: Secondary | ICD-10-CM

## 2011-02-26 DIAGNOSIS — I2510 Atherosclerotic heart disease of native coronary artery without angina pectoris: Secondary | ICD-10-CM

## 2011-02-26 DIAGNOSIS — I679 Cerebrovascular disease, unspecified: Secondary | ICD-10-CM

## 2011-02-26 DIAGNOSIS — E119 Type 2 diabetes mellitus without complications: Secondary | ICD-10-CM

## 2011-02-26 DIAGNOSIS — J449 Chronic obstructive pulmonary disease, unspecified: Secondary | ICD-10-CM

## 2011-02-26 DIAGNOSIS — E785 Hyperlipidemia, unspecified: Secondary | ICD-10-CM

## 2011-02-26 NOTE — Assessment & Plan Note (Addendum)
.  Patient is doing well with no symptoms to suggest recurrent myocardial ischemia.  LV systolic function normalized following revascularization surgery.  He has no manifestations of congestive heart failure at the present time.

## 2011-02-26 NOTE — Assessment & Plan Note (Signed)
Symptoms are relatively well controlled at present.

## 2011-02-26 NOTE — Patient Instructions (Signed)
Your physician recommends that you schedule a follow-up appointment in: 1 year Stool cards- follow instructions in packet strictly

## 2011-02-26 NOTE — Assessment & Plan Note (Addendum)
No evidence for restenosis when duplex study last performed approximately one year ago.  Repeat imaging every 3 years or so, as long he remains asymptomatic, should be adequate.

## 2011-02-26 NOTE — Progress Notes (Signed)
HPI : Mr. Schonberg returns to the office for continued evaluation and treatment of coronary disease and cardiovascular risk factors.  He continues to do generally well, but fatigue persists.  He does not identify a specific sleep disorder and has not been told that he snores.  Recent stool Hemoccults yielded positive results on one of 3 specimens.  Patient has noted hemorrhoids with blood on toilet tissue at times and did not restrict meat in his diet immediately before collecting stool specimens.  Colonoscopy was performed 3 years ago and was negative.  Current Outpatient Prescriptions on File Prior to Visit  Medication Sig Dispense Refill  . aspirin 81 MG tablet Take 81 mg by mouth daily.        . diazepam (VALIUM) 10 MG tablet Take 10 mg by mouth every 6 (six) hours as needed.        . docusate sodium (COLACE) 100 MG capsule Take 100 mg by mouth 2 (two) times daily.        Marland Kitchen escitalopram (LEXAPRO) 20 MG tablet Take 20 mg by mouth daily.        . fenofibrate 160 MG tablet Take 160 mg by mouth daily.        Marland Kitchen HYDROcodone-acetaminophen (NORCO) 10-325 MG per tablet Take 1 tablet by mouth every 6 (six) hours as needed.        Marland Kitchen levothyroxine (SYNTHROID, LEVOTHROID) 175 MCG tablet Take 175 mcg by mouth daily.        . nebivolol (BYSTOLIC) 5 MG tablet Take 5 mg by mouth daily.        Marland Kitchen PRINIVIL 40 MG tablet TAKE ONE TABLET BY MOUTH ONCE DAILY.  90 each  0  . zaleplon (SONATA) 10 MG capsule Take 10 mg by mouth at bedtime.           Allergies  Allergen Reactions  . Alprazolam   . Atorvastatin   . Fenofibrate   . Ibuprofen       Past medical history, social history, and family history reviewed and updated.  ROS: Denies chest pain, orthopnea, PND, lightheadedness or syncope.  He has no significant dyspnea on exertion.  PHYSICAL EXAM: BP 106/60  Pulse 69  Ht 5\' 9"  (1.753 m)  Wt 132.904 kg (293 lb)  BMI 43.27 kg/m2  SpO2 94%  General-Well developed; no acute distress Body  habitus-Obese Neck-No JVD; no carotid bruits; left carotid endarterectomy scar Lungs-bibasilar rales,clear lung fields following cough; resonant to percussion Cardiovascular-normal PMI; normal S1 and S2 Abdomen-normal bowel sounds; soft and non-tender without masses or organomegaly Musculoskeletal-No deformities, no cyanosis or clubbing Neurologic-Normal cranial nerves; symmetric strength and tone Skin-Warm, no significant lesions Extremities-distal pulses intact; no edema; surgical scars over the left lower leg as the result of vein harvesting prior to CABG  ASSESSMENT AND PLAN:

## 2011-02-26 NOTE — Assessment & Plan Note (Signed)
Blood pressure control is excellent; current medications will be continued. 

## 2011-02-26 NOTE — Assessment & Plan Note (Addendum)
Lipids are under excellent control with moderate dose simvastatin plus fenofibrate, which will be continued.

## 2011-03-04 ENCOUNTER — Encounter: Payer: Self-pay | Admitting: Cardiology

## 2011-05-18 LAB — DIFFERENTIAL
Basophils Absolute: 0.1
Basophils Relative: 1
Eosinophils Absolute: 0.1
Eosinophils Relative: 1
Lymphocytes Relative: 26
Lymphs Abs: 3.1
Monocytes Absolute: 1.2 — ABNORMAL HIGH
Monocytes Relative: 10
Neutro Abs: 7.4
Neutrophils Relative %: 62

## 2011-05-18 LAB — CBC
HCT: 44.3
Hemoglobin: 15.1
MCHC: 34.1
MCV: 90.7
Platelets: 251
RBC: 4.89
RDW: 13.1
WBC: 11.9 — ABNORMAL HIGH

## 2011-05-18 LAB — BASIC METABOLIC PANEL
BUN: 22
CO2: 23
Calcium: 9.5
Chloride: 102
Creatinine, Ser: 1.32
GFR calc Af Amer: >60
GFR calc non Af Amer: 57 — ABNORMAL LOW
Glucose, Bld: 136 — ABNORMAL HIGH
Potassium: 4.5
Sodium: 134 — ABNORMAL LOW

## 2011-05-18 LAB — POCT CARDIAC MARKERS
CKMB, poc: <1 — ABNORMAL LOW
Myoglobin, poc: 142
Operator id: 218581
Troponin i, poc: <0.05

## 2011-08-10 ENCOUNTER — Encounter: Payer: Self-pay | Admitting: Cardiology

## 2011-09-06 ENCOUNTER — Other Ambulatory Visit: Payer: Self-pay | Admitting: Cardiology

## 2012-02-27 ENCOUNTER — Ambulatory Visit (INDEPENDENT_AMBULATORY_CARE_PROVIDER_SITE_OTHER): Payer: Medicare Other | Admitting: Physician Assistant

## 2012-02-27 ENCOUNTER — Encounter: Payer: Self-pay | Admitting: Physician Assistant

## 2012-02-27 ENCOUNTER — Ambulatory Visit: Payer: Medicare Other | Admitting: Cardiology

## 2012-02-27 VITALS — BP 118/74 | HR 67 | Ht 69.0 in | Wt 289.1 lb

## 2012-02-27 DIAGNOSIS — E785 Hyperlipidemia, unspecified: Secondary | ICD-10-CM

## 2012-02-27 DIAGNOSIS — E669 Obesity, unspecified: Secondary | ICD-10-CM

## 2012-02-27 DIAGNOSIS — I7090 Unspecified atherosclerosis: Secondary | ICD-10-CM

## 2012-02-27 DIAGNOSIS — I2510 Atherosclerotic heart disease of native coronary artery without angina pectoris: Secondary | ICD-10-CM

## 2012-02-27 DIAGNOSIS — I10 Essential (primary) hypertension: Secondary | ICD-10-CM

## 2012-02-27 NOTE — Assessment & Plan Note (Signed)
>>  ASSESSMENT AND PLAN FOR CLASS 3 OBESITY DUE TO EXCESS CALORIES WITH SERIOUS COMORBIDITY AND BODY MASS INDEX (BMI) OF 40.0 TO 44.9 IN ADULT WRITTEN ON 02/27/2012  1:31 PM BY Chanel Mckesson M, PA  Patient continues to try to lose weight. He does have chronic back pain but tries to walk on a treadmill 30 minutes daily.

## 2012-02-27 NOTE — Patient Instructions (Signed)
Your physician recommends that you continue on your current medications as directed. Please refer to the Current Medication list given to you today.  Your physician recommends that you schedule a follow-up appointment in: 1 year  

## 2012-02-27 NOTE — Assessment & Plan Note (Signed)
Patient continues to try to lose weight. He does have chronic back pain but tries to walk on a treadmill 30 minutes daily.

## 2012-02-27 NOTE — Assessment & Plan Note (Signed)
Patient states that Dr. Phillips Odor keeps tabs on his lipids.

## 2012-02-27 NOTE — Progress Notes (Signed)
HPI: This is a 56 year old white male patient who is here for his yearly followup. He has history of coronary artery disease status post CABG in 2002. He also underwent carotid endarterectomy in 2005. He is followed by Dr. Phillips Odor regularly for his diabetes, hyperlipidemia, and thyroid disease. He also has chronic back problems. His main complaint today is sometimes he has extreme fatigue where he just wants to rest all day long and not do anything. Other days he feels fine. His Synthroid was recently increased to 200 mcg daily. He denies any chest pain, palpitations, dyspnea, dizziness, or presyncope. He says he was working in the yard in the extreme heat digging up the pain is recently and he had a stopped because he got out of breath. This never happens unless he becomes overheated and over exerts himself. He continues to try to lose weight and is down about 21 pounds.    Allergies:   -- Alprazolam   -- Atorvastatin   -- Fenofibrate   -- Ibuprofen   Current Outpatient Prescriptions on File Prior to Visit: albuterol-ipratropium (COMBIVENT) 18-103 MCG/ACT inhaler, Inhale 2 puffs into the lungs every 6 (six) hours as needed.  , Disp: , Rfl:  aspirin 81 MG tablet, Take 81 mg by mouth daily.  , Disp: , Rfl:  diazepam (VALIUM) 10 MG tablet, Take 10 mg by mouth every 6 (six) hours as needed.  , Disp: , Rfl:  docusate sodium (COLACE) 100 MG capsule, Take 100 mg by mouth 2 (two) times daily.  , Disp: , Rfl:  escitalopram (LEXAPRO) 20 MG tablet, Take 20 mg by mouth daily.  , Disp: , Rfl:  fenofibrate 160 MG tablet, Take 160 mg by mouth daily.  , Disp: , Rfl:  glipiZIDE (GLUCOTROL) 5 MG tablet, Take 5 mg by mouth 2 (two) times daily.  , Disp: , Rfl:  HYDROcodone-acetaminophen (NORCO) 10-325 MG per tablet, Take 1 tablet by mouth every 6 (six) hours as needed.  , Disp: , Rfl:  Multiple Vitamins-Minerals (MULTIVITAMIN WITH MINERALS) tablet, Take 1 tablet by mouth daily.  , Disp: , Rfl:  nebivolol  (BYSTOLIC) 5 MG tablet, Take 5 mg by mouth daily.  , Disp: , Rfl:   PRINIVIL 40 MG tablet, TAKE ONE TABLET BY MOUTH ONCE DAILY., Disp: 90 each, Rfl: 1 simvastatin (ZOCOR) 40 MG tablet, Take 40 mg by mouth at bedtime.  , Disp: , Rfl:     Past Medical History:   ASCVD (arteriosclerotic cardiovascular disease)                Comment:-MI in 01/2001 prompted CABG; EF-30% at cath;               50% on echo in 7/02 and normal in 2005   Cerebrovascular disease                                        Comment: L CEA 06/2004 following left renal embolism;               10/2008 plaque w/o focal stenosis   Hyperlipidemia                                               Tobacco abuse, in remission  Comment:50 pack years; discontinued in 2002   Diabetes mellitus                                              Comment:-no insulin   Chronic obstructive pulmonary disease                        Obesity                                                      Hypothyroidism                                               Degenerative joint disease                                     Comment: chronic LBP-s/p L3-4 fusion   Hypertension                                                 Restless leg syndrome                                          Comment:Possible  Past Surgical History:   LUMBAR SPINE SURGERY                                           Comment:L3-4 fusion   CORONARY ARTERY BYPASS GRAFT                    2002         CAROTID ENDARTERECTOMY                          2005           Comment:Left   CATARACT EXTRACTION                                            Comment:bilateral  No family history on file.   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 Neck: No JVD, HJR, Bruit, or thyroid enlargement  Lungs: No tachypnea, clear without wheezing, rales, or rhonchi  Cardiovascular: RRR, PMI not displaced, heart sounds normal, no murmurs, gallops, bruit, thrill, or heave.  Abdomen: BS normal. Soft without organomegaly, masses, lesions or tenderness.  Extremities: without cyanosis, clubbing or edema. Good distal pulses bilateral  SKin: Warm, no lesions or rashes   Musculoskeletal: No deformities  Neuro: no focal signs  BP 118/74  Pulse 67  Ht 5\' 9"  (1.753 m)  Wt 289 lb 1.9 oz (131.144 kg)  BMI 42.70 kg/m2   WUJ:WJXBJY sinus rhythm poor R-wave progression, low voltage no acute change

## 2012-02-27 NOTE — Assessment & Plan Note (Signed)
stable °

## 2012-02-27 NOTE — Assessment & Plan Note (Signed)
Patient had CABG in 2002 and has done well since without recurrent chest pain. Followup in one year

## 2012-03-26 ENCOUNTER — Other Ambulatory Visit: Payer: Self-pay | Admitting: Cardiology

## 2012-10-31 ENCOUNTER — Other Ambulatory Visit: Payer: Self-pay | Admitting: Cardiology

## 2013-03-19 ENCOUNTER — Ambulatory Visit: Payer: Medicare Other | Admitting: Cardiovascular Disease

## 2013-04-07 ENCOUNTER — Ambulatory Visit: Payer: Medicare Other | Admitting: Cardiovascular Disease

## 2013-04-29 ENCOUNTER — Ambulatory Visit (INDEPENDENT_AMBULATORY_CARE_PROVIDER_SITE_OTHER): Payer: Medicare Other | Admitting: Cardiovascular Disease

## 2013-04-29 ENCOUNTER — Encounter: Payer: Self-pay | Admitting: Cardiovascular Disease

## 2013-04-29 VITALS — BP 127/87 | HR 75 | Ht 69.0 in | Wt 292.8 lb

## 2013-04-29 DIAGNOSIS — E785 Hyperlipidemia, unspecified: Secondary | ICD-10-CM

## 2013-04-29 DIAGNOSIS — I679 Cerebrovascular disease, unspecified: Secondary | ICD-10-CM

## 2013-04-29 DIAGNOSIS — I10 Essential (primary) hypertension: Secondary | ICD-10-CM

## 2013-04-29 DIAGNOSIS — G4733 Obstructive sleep apnea (adult) (pediatric): Secondary | ICD-10-CM

## 2013-04-29 DIAGNOSIS — I7090 Unspecified atherosclerosis: Secondary | ICD-10-CM

## 2013-04-29 DIAGNOSIS — E669 Obesity, unspecified: Secondary | ICD-10-CM

## 2013-04-29 DIAGNOSIS — I2510 Atherosclerotic heart disease of native coronary artery without angina pectoris: Secondary | ICD-10-CM

## 2013-04-29 NOTE — Progress Notes (Signed)
Patient ID: [name], male   DOB: [birth year], 57 y.o.   MRN: [id number]   SUBJECTIVE: [name] is a 57 year-old man who is here for his yearly follow-up. He has a history of coronary artery disease status post CABG in 2002. He also underwent carotid endarterectomy in 2005. He is followed by Dr. Phillips Williamson regularly for his diabetes, hyperlipidemia, and thyroid disease. He also has chronic back problems.   Prior to his CABG, he had symptoms of "indigestion" and he thought it was from a cheeseburger he had eaten that day. He's had no recurrence of such symptoms. His weight fluctuates 10-15 lbs, and he wants to exercise more on his treadmill, but has been limited to some degree by back problems.  He does have some daytime somnolence. When I suggested a sleep study, he said he's been told he needed one in the past, but says due to his restless leg syndrome, he wouldn't tolerate CPAP.  He also denies syncope. He may get hand and ankle swelling if he eats too much salt, which he tries to avoid.  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     Allergies  Allergen Reactions  . Alprazolam   . Atorvastatin   . Fenofibrate   . Ibuprofen     Current Outpatient Prescriptions  Medication Sig Dispense Refill  . albuterol-ipratropium (COMBIVENT) 18-103 MCG/ACT inhaler Inhale 2 puffs into the lungs every 6 (six) hours as needed.        Marland Kitchen aspirin 81 MG tablet Take 81 mg by mouth daily.        . diazepam (VALIUM) 10 MG tablet Take 10 mg by mouth every 6 (six) hours as needed.        . docusate sodium (COLACE) 100 MG capsule Take 100 mg by mouth 2 (two) times daily.        Marland Kitchen escitalopram (LEXAPRO) 20 MG tablet Take 20 mg by mouth daily.        . fenofibrate 160 MG tablet Take 160 mg by mouth daily.        . fish oil-omega-3 fatty acids 1000 MG capsule Take 2 g by mouth daily.      Marland Kitchen glipiZIDE (GLUCOTROL) 5 MG tablet  Take 5 mg by mouth 2 (two) times daily.        Marland Kitchen HYDROcodone-acetaminophen (NORCO) 10-325 MG per tablet Take 1 tablet by mouth every 6 (six) hours as needed.        Marland Kitchen levothyroxine (SYNTHROID, LEVOTHROID) 200 MCG tablet Take 200 mcg by mouth daily.      Marland Kitchen lisinopril (PRINIVIL,ZESTRIL) 40 MG tablet TAKE ONE TABLET BY MOUTH ONCE DAILY.  90 tablet  3  . Multiple Vitamins-Minerals (MULTIVITAMIN WITH MINERALS) tablet Take 1 tablet by mouth daily.        . nebivolol (BYSTOLIC) 5 MG tablet Take 5 mg by mouth daily.        . simvastatin (ZOCOR) 40 MG tablet Take 40 mg by mouth at bedtime.         No current facility-administered medications for this visit.    Past Medical History  Diagnosis Date  . ASCVD (arteriosclerotic cardiovascular disease)     -MI in 01/2001 prompted CABG; EF-30% at cath; 50% on echo in 7/02 and normal in 2005  . Cerebrovascular disease      L CEA 06/2004 following left renal embolism; 10/2008 plaque w/o  focal stenosis  . Hyperlipidemia   . Tobacco abuse, in remission     50 pack years; discontinued in 2002  . Diabetes mellitus     -no insulin  . Chronic obstructive pulmonary disease   . Obesity   . Hypothyroidism   . Degenerative joint disease      chronic LBP-s/p L3-4 fusion  . Hypertension   . Restless leg syndrome     Possible    Past Surgical History  Procedure Laterality Date  . Lumbar spine surgery      L3-4 fusion  . Coronary artery bypass graft  2002  . Carotid endarterectomy  2005    Left  . Cataract extraction      bilateral    History   Social History  . Marital Status: Divorced    Spouse Name: N/A    Number of Children: 1  . Years of Education: N/A   Occupational History  . veteran     disabledf   Social History Main Topics  . Smoking status: Former Smoker -- 1.00 packs/day for 50 years    Types: Cigarettes    Quit date: 01/15/2001  . Smokeless tobacco: Never Used  . Alcohol Use: No  . Drug Use: No  . Sexual Activity: Not on file    Other Topics Concern  . Not on file   Social History Narrative  . No narrative on file     BP: 127/87  Pulse: 75  PHYSICAL EXAM General: NAD Neck: No JVD, no thyromegaly or thyroid nodule.  Lungs: Clear to auscultation bilaterally with normal respiratory effort. CV: Nondisplaced PMI.  Heart regular S1/S2, no S3/S4, no murmur.  No peripheral edema.  No carotid bruit.  Normal pedal pulses.  Abdomen: Soft, nontender, no hepatosplenomegaly, no distention.  Neurologic: Alert and oriented x 3.  Psych: Normal affect. Extremities: No clubbing or cyanosis.   ECG: reviewed and available in electronic records.      ASSESSMENT AND PLAN: 1. CAD s/p CABG: no changes in therapy today. Continue ASA and simvastatin. 2. HTN: controlled on current therapy, which includes Bystolic and lisinopril. 3. Hyperlipidemia: managed by PCP, and is on simvastatin. 4. Daytime somnolence: most likely has sleep apnea, and I reinforced getting a sleep study, informing him of the potential cardiovascular effects of untreated obstructive sleep apnea.  Prentice Docker, M.D., F.A.C.C.

## 2013-04-29 NOTE — Patient Instructions (Addendum)
Your physician recommends that you schedule a follow-up appointment in: ONE YEAR  Your physician recommends that you continue on your current medications as directed. Please refer to the Current Medication list given to you today.  

## 2014-07-12 ENCOUNTER — Ambulatory Visit (HOSPITAL_COMMUNITY)
Admission: RE | Admit: 2014-07-12 | Discharge: 2014-07-12 | Disposition: A | Discharge status: Home / Self Care | Payer: Medicare Other | Source: Ambulatory Visit | Attending: Family Medicine | Admitting: Family Medicine

## 2014-07-12 ENCOUNTER — Other Ambulatory Visit (HOSPITAL_COMMUNITY): Payer: Self-pay | Admitting: Family Medicine

## 2014-07-12 DIAGNOSIS — R05 Cough: Secondary | ICD-10-CM | POA: Diagnosis present

## 2014-07-12 DIAGNOSIS — J209 Acute bronchitis, unspecified: Secondary | ICD-10-CM

## 2014-07-12 DIAGNOSIS — R0602 Shortness of breath: Secondary | ICD-10-CM | POA: Insufficient documentation

## 2014-07-12 DIAGNOSIS — R509 Fever, unspecified: Secondary | ICD-10-CM | POA: Insufficient documentation

## 2014-07-12 DIAGNOSIS — Z87891 Personal history of nicotine dependence: Secondary | ICD-10-CM | POA: Insufficient documentation

## 2014-07-12 DIAGNOSIS — Z951 Presence of aortocoronary bypass graft: Secondary | ICD-10-CM | POA: Diagnosis not present

## 2014-08-18 DIAGNOSIS — M47816 Spondylosis without myelopathy or radiculopathy, lumbar region: Secondary | ICD-10-CM | POA: Diagnosis not present

## 2014-08-31 DIAGNOSIS — Z6841 Body Mass Index (BMI) 40.0 and over, adult: Secondary | ICD-10-CM | POA: Diagnosis not present

## 2014-08-31 DIAGNOSIS — E782 Mixed hyperlipidemia: Secondary | ICD-10-CM | POA: Diagnosis not present

## 2014-08-31 DIAGNOSIS — I10 Essential (primary) hypertension: Secondary | ICD-10-CM | POA: Diagnosis not present

## 2014-08-31 DIAGNOSIS — E063 Autoimmune thyroiditis: Secondary | ICD-10-CM | POA: Diagnosis not present

## 2014-08-31 DIAGNOSIS — E1165 Type 2 diabetes mellitus with hyperglycemia: Secondary | ICD-10-CM | POA: Diagnosis not present

## 2014-10-04 DIAGNOSIS — E1165 Type 2 diabetes mellitus with hyperglycemia: Secondary | ICD-10-CM | POA: Diagnosis not present

## 2014-10-04 DIAGNOSIS — Z6841 Body Mass Index (BMI) 40.0 and over, adult: Secondary | ICD-10-CM | POA: Diagnosis not present

## 2014-12-01 DIAGNOSIS — M47816 Spondylosis without myelopathy or radiculopathy, lumbar region: Secondary | ICD-10-CM | POA: Diagnosis not present

## 2015-01-19 DIAGNOSIS — N183 Chronic kidney disease, stage 3 (moderate): Secondary | ICD-10-CM | POA: Diagnosis not present

## 2015-01-31 ENCOUNTER — Other Ambulatory Visit (HOSPITAL_COMMUNITY): Payer: Self-pay | Admitting: Nephrology

## 2015-01-31 DIAGNOSIS — N183 Chronic kidney disease, stage 3 unspecified: Secondary | ICD-10-CM

## 2015-02-07 DIAGNOSIS — I10 Essential (primary) hypertension: Secondary | ICD-10-CM | POA: Diagnosis not present

## 2015-02-07 DIAGNOSIS — E1165 Type 2 diabetes mellitus with hyperglycemia: Secondary | ICD-10-CM | POA: Diagnosis not present

## 2015-02-07 DIAGNOSIS — Z1389 Encounter for screening for other disorder: Secondary | ICD-10-CM | POA: Diagnosis not present

## 2015-02-07 DIAGNOSIS — Z6841 Body Mass Index (BMI) 40.0 and over, adult: Secondary | ICD-10-CM | POA: Diagnosis not present

## 2015-02-07 DIAGNOSIS — E782 Mixed hyperlipidemia: Secondary | ICD-10-CM | POA: Diagnosis not present

## 2015-02-08 DIAGNOSIS — R809 Proteinuria, unspecified: Secondary | ICD-10-CM | POA: Diagnosis not present

## 2015-02-08 DIAGNOSIS — N183 Chronic kidney disease, stage 3 (moderate): Secondary | ICD-10-CM | POA: Diagnosis not present

## 2015-02-08 DIAGNOSIS — E559 Vitamin D deficiency, unspecified: Secondary | ICD-10-CM | POA: Diagnosis not present

## 2015-02-08 DIAGNOSIS — E1165 Type 2 diabetes mellitus with hyperglycemia: Secondary | ICD-10-CM | POA: Diagnosis not present

## 2015-02-08 DIAGNOSIS — Z6841 Body Mass Index (BMI) 40.0 and over, adult: Secondary | ICD-10-CM | POA: Diagnosis not present

## 2015-02-08 DIAGNOSIS — Z79899 Other long term (current) drug therapy: Secondary | ICD-10-CM | POA: Diagnosis not present

## 2015-02-08 DIAGNOSIS — I10 Essential (primary) hypertension: Secondary | ICD-10-CM | POA: Diagnosis not present

## 2015-02-08 DIAGNOSIS — Z0000 Encounter for general adult medical examination without abnormal findings: Secondary | ICD-10-CM | POA: Diagnosis not present

## 2015-02-08 DIAGNOSIS — D509 Iron deficiency anemia, unspecified: Secondary | ICD-10-CM | POA: Diagnosis not present

## 2015-02-10 ENCOUNTER — Ambulatory Visit (HOSPITAL_COMMUNITY)
Admission: RE | Admit: 2015-02-10 | Discharge: 2015-02-10 | Disposition: A | Discharge status: Home / Self Care | Payer: Medicare Other | Source: Ambulatory Visit | Attending: Nephrology | Admitting: Nephrology

## 2015-02-10 DIAGNOSIS — R932 Abnormal findings on diagnostic imaging of liver and biliary tract: Secondary | ICD-10-CM | POA: Diagnosis not present

## 2015-02-10 DIAGNOSIS — N183 Chronic kidney disease, stage 3 unspecified: Secondary | ICD-10-CM

## 2015-03-08 DIAGNOSIS — M47816 Spondylosis without myelopathy or radiculopathy, lumbar region: Secondary | ICD-10-CM | POA: Diagnosis not present

## 2015-04-01 DIAGNOSIS — E039 Hypothyroidism, unspecified: Secondary | ICD-10-CM | POA: Diagnosis not present

## 2015-04-01 DIAGNOSIS — E785 Hyperlipidemia, unspecified: Secondary | ICD-10-CM | POA: Diagnosis not present

## 2015-04-01 DIAGNOSIS — E1159 Type 2 diabetes mellitus with other circulatory complications: Secondary | ICD-10-CM | POA: Diagnosis not present

## 2015-04-01 DIAGNOSIS — I10 Essential (primary) hypertension: Secondary | ICD-10-CM | POA: Diagnosis not present

## 2015-04-08 DIAGNOSIS — I10 Essential (primary) hypertension: Secondary | ICD-10-CM | POA: Diagnosis not present

## 2015-04-08 DIAGNOSIS — E039 Hypothyroidism, unspecified: Secondary | ICD-10-CM | POA: Diagnosis not present

## 2015-04-08 DIAGNOSIS — E785 Hyperlipidemia, unspecified: Secondary | ICD-10-CM | POA: Diagnosis not present

## 2015-04-08 DIAGNOSIS — E1159 Type 2 diabetes mellitus with other circulatory complications: Secondary | ICD-10-CM | POA: Diagnosis not present

## 2015-04-20 DIAGNOSIS — N183 Chronic kidney disease, stage 3 (moderate): Secondary | ICD-10-CM | POA: Diagnosis not present

## 2015-04-20 DIAGNOSIS — E559 Vitamin D deficiency, unspecified: Secondary | ICD-10-CM | POA: Diagnosis not present

## 2015-04-20 DIAGNOSIS — I10 Essential (primary) hypertension: Secondary | ICD-10-CM | POA: Diagnosis not present

## 2015-05-25 ENCOUNTER — Other Ambulatory Visit: Payer: Self-pay | Admitting: "Endocrinology

## 2015-05-25 DIAGNOSIS — Z23 Encounter for immunization: Secondary | ICD-10-CM | POA: Diagnosis not present

## 2015-05-25 DIAGNOSIS — I10 Essential (primary) hypertension: Secondary | ICD-10-CM | POA: Diagnosis not present

## 2015-05-25 DIAGNOSIS — E782 Mixed hyperlipidemia: Secondary | ICD-10-CM | POA: Diagnosis not present

## 2015-05-25 DIAGNOSIS — E1159 Type 2 diabetes mellitus with other circulatory complications: Secondary | ICD-10-CM | POA: Diagnosis not present

## 2015-05-26 LAB — COMPREHENSIVE METABOLIC PANEL
ALT: 51 IU/L — ABNORMAL HIGH (ref 0–44)
AST: 38 IU/L (ref 0–40)
Albumin/Globulin Ratio: 1.7 (ref 1.1–2.5)
Albumin: 5 g/dL (ref 3.5–5.5)
Alkaline Phosphatase: 28 IU/L — ABNORMAL LOW (ref 39–117)
BUN/Creatinine Ratio: 19 (ref 9–20)
BUN: 37 mg/dL — ABNORMAL HIGH (ref 6–24)
Bilirubin Total: 0.4 mg/dL (ref 0.0–1.2)
CO2: 18 mmol/L (ref 18–29)
CREATININE: 1.9 mg/dL — AB (ref 0.76–1.27)
Calcium: 10.7 mg/dL — ABNORMAL HIGH (ref 8.7–10.2)
Chloride: 97 mmol/L (ref 97–108)
GFR calc Af Amer: 44 mL/min/{1.73_m2} — ABNORMAL LOW (ref >59)
GFR calc non Af Amer: 38 mL/min/{1.73_m2} — ABNORMAL LOW (ref >59)
GLOBULIN, TOTAL: 3 g/dL (ref 1.5–4.5)
Glucose: 161 mg/dL — ABNORMAL HIGH (ref 65–99)
POTASSIUM: 5.3 mmol/L — AB (ref 3.5–5.2)
Sodium: 136 mmol/L (ref 134–144)
Total Protein: 8 g/dL (ref 6.0–8.5)

## 2015-05-26 LAB — LIPID PANEL W/O CHOL/HDL RATIO
Cholesterol, Total: 167 mg/dL (ref 100–199)
HDL: 41 mg/dL (ref >39)
LDL CALC: 80 mg/dL (ref 0–99)
Triglycerides: 230 mg/dL — ABNORMAL HIGH (ref 0–149)
VLDL Cholesterol Cal: 46 mg/dL — ABNORMAL HIGH (ref 5–40)

## 2015-05-26 LAB — VITAMIN D 25 HYDROXY (VIT D DEFICIENCY, FRACTURES): Vit D, 25-Hydroxy: 26 ng/mL — ABNORMAL LOW (ref 30.0–100.0)

## 2015-05-26 LAB — TSH+FREE T4
Free T4: 0.47 ng/dL — ABNORMAL LOW (ref 0.82–1.77)
TSH: 49.77 u[IU]/mL — ABNORMAL HIGH (ref 0.450–4.500)

## 2015-05-26 LAB — HGB A1C W/O EAG: Hgb A1c MFr Bld: 7.1 % — ABNORMAL HIGH (ref 4.8–5.6)

## 2015-05-26 LAB — MICROALBUMIN, URINE: Microalbumin, Urine: 22.4 ug/mL

## 2015-05-27 ENCOUNTER — Encounter: Payer: Self-pay | Admitting: Nutrition

## 2015-05-27 ENCOUNTER — Encounter: Payer: Medicare Other | Attending: "Endocrinology | Admitting: Nutrition

## 2015-05-27 VITALS — Ht 69.0 in | Wt 274.0 lb

## 2015-05-27 DIAGNOSIS — E1165 Type 2 diabetes mellitus with hyperglycemia: Secondary | ICD-10-CM | POA: Insufficient documentation

## 2015-05-27 DIAGNOSIS — Z713 Dietary counseling and surveillance: Secondary | ICD-10-CM | POA: Insufficient documentation

## 2015-05-27 DIAGNOSIS — Z6841 Body Mass Index (BMI) 40.0 and over, adult: Secondary | ICD-10-CM | POA: Insufficient documentation

## 2015-05-27 DIAGNOSIS — Z794 Long term (current) use of insulin: Secondary | ICD-10-CM | POA: Diagnosis not present

## 2015-05-27 DIAGNOSIS — E118 Type 2 diabetes mellitus with unspecified complications: Secondary | ICD-10-CM

## 2015-05-27 NOTE — Patient Instructions (Signed)
Goals; 1 Follow the Plate Method. 2. Eat three meals per day at times discussed. 3. Make sure you're eating 30-45 grams of carbs per meal. 4. Continue walking daiily. 5. Lose 1 lb per week. 6. Keep A1C down below 7% in three months.

## 2015-05-27 NOTE — Progress Notes (Signed)
  Medical Nutrition Therapy:  Appt start time: 1100 end time:  1200.   Assessment:  Primary concerns today: Diabets. Type 2. Limited mobility. Helps take care of his mom. Takes Lantus 20 units anm in stomach area., Januvia 50 mg .. A1C was 9.1% and now down to 7.1%.Marland Kitchen.   He does the shopping and step dad does the cooking.  Lost 20 lbs in the last month he notes. Walks 20 minutes twice a day. Eats 3 meals per day. Most foods are baked and some fried. Eats out some. Only drinking water. Has been eating more fresh fruit and vegetable and watching portion sizes.  Has made excellent progress with exercising and watching portion sizes to improve blood sugars. Diet needs a little more complex carbs and more lower carb vegetables at meals.  Lab Results  Component Value Date   HGBA1C 7.1* 05/25/2015    Lab Results  Component Value Date   CHOL 167 05/25/2015   HDL 41 05/25/2015   LDLCALC 80 05/25/2015   TRIG 230* 05/25/2015   CHOLHDL 3.4 Ratio 12/26/2009    Wt Readings from Last 3 Encounters:  04/29/13 292 lb 12 oz (132.791 kg)  02/27/12 289 lb 1.9 oz (131.144 kg)  02/26/11 293 lb (132.904 kg)   Ht Readings from Last 3 Encounters:  04/29/13 5\' 9"  (1.753 m)  02/27/12 5\' 9"  (1.753 m)  02/26/11 5\' 9"  (1.753 m)   There is no weight on file to calculate BMI.  Preferred Learning Style:  Auditory  Visual  Hands on  Learning Readiness:   Ready  Change in progress   MEDICATIONS: See List   DIETARY INTAKE:  24-hr recall:  B ( AM): Egg omelet with tenderloin and with 6 french fries. OR Granola bar or fruit Snk ( AM):   L ( PM): Fish and baked potato, toss salad and water Snk ( PM):  D ( PM): Chicken salad sandwich, water Snk ( PM): none Beverages: water  Usual physical activity:walking 20 minutes three times.  Estimated energy needs: 1800 calories 200 g carbohydrates 135 g protein 50 g fat  Progress Towards Goal(s):  In progress.   Nutritional Diagnosis:  NB-1.1 Food  and nutrition-related knowledge deficit As related to Diabetes.  As evidenced by A1C 7.1, down from 9.3%..   Intervention: Nutrition and Diabetes education provided on My Plate, CHO counting, meal planning, portion sizes, timing of meals, avoiding snacks between meals unless having a low blood sugar, target ranges for A1C and blood sugars, signs/symptoms and treatment of hyper/hypoglycemia, monitoring blood sugars, taking medications as prescribed, benefits of exercising 30 minutes per day and prevention of complications of DM.  Goals; 1 Follow the Plate Method. 2. Eat three meals per day at times discussed. 3. Make sure you're eating 30-45 grams of carbs per meal. 4. Continue walking daiily. 5. Lose 1 lb per week. 6. Keep A1C down below 7% in three months.   Teaching Method Utilized:  Visual Auditory Hands on  Handouts given during visit include:  The Plate Method  Diabetes Instructions.  Barriers to learning/adherence to lifestyle change:  LImited mobility  Demonstrated degree of understanding via:  Teach Back   Monitoring/Evaluation:  Dietary intake, exercise, meal planning, SBG, and body weight in 3 month(s).

## 2015-06-03 ENCOUNTER — Ambulatory Visit (INDEPENDENT_AMBULATORY_CARE_PROVIDER_SITE_OTHER): Payer: Medicare Other | Admitting: "Endocrinology

## 2015-06-03 VITALS — BP 90/60 | HR 67 | Ht 69.0 in | Wt 272.0 lb

## 2015-06-03 DIAGNOSIS — E1122 Type 2 diabetes mellitus with diabetic chronic kidney disease: Secondary | ICD-10-CM | POA: Diagnosis not present

## 2015-06-03 DIAGNOSIS — I10 Essential (primary) hypertension: Secondary | ICD-10-CM | POA: Diagnosis not present

## 2015-06-03 DIAGNOSIS — E785 Hyperlipidemia, unspecified: Secondary | ICD-10-CM | POA: Diagnosis not present

## 2015-06-03 DIAGNOSIS — N183 Chronic kidney disease, stage 3 unspecified: Secondary | ICD-10-CM

## 2015-06-03 DIAGNOSIS — E669 Obesity, unspecified: Secondary | ICD-10-CM

## 2015-06-03 DIAGNOSIS — E1159 Type 2 diabetes mellitus with other circulatory complications: Secondary | ICD-10-CM | POA: Diagnosis not present

## 2015-06-03 DIAGNOSIS — Z794 Long term (current) use of insulin: Secondary | ICD-10-CM

## 2015-06-03 DIAGNOSIS — E038 Other specified hypothyroidism: Secondary | ICD-10-CM | POA: Diagnosis not present

## 2015-06-03 NOTE — Patient Instructions (Signed)

## 2015-06-04 NOTE — Progress Notes (Signed)
Subjective:    Patient ID: [name], male    DOB: Sep 09, 1955,    Past Medical History  Diagnosis Date  . ASCVD (arteriosclerotic cardiovascular disease)     -MI in 01/2001 prompted CABG; EF-30% at cath; 50% on echo in 7/02 and normal in 2005  . Cerebrovascular disease      L CEA 06/2004 following left renal embolism; 10/2008 plaque w/o focal stenosis  . Hyperlipidemia   . Tobacco abuse, in remission     50 pack years; discontinued in 2002  . Diabetes mellitus     -no insulin  . Chronic obstructive pulmonary disease (HCC)   . Obesity   . Hypothyroidism   . Degenerative joint disease      chronic LBP-s/p L3-4 fusion  . Hypertension   . Restless leg syndrome     Possible   Past Surgical History  Procedure Laterality Date  . Lumbar spine surgery      L3-4 fusion  . Coronary artery bypass graft  2002  . Carotid endarterectomy  2005    Left  . Cataract extraction      bilateral   Social History   Social History  . Marital Status: Divorced    Spouse Name: N/A  . Number of Children: 1  . Years of Education: N/A   Occupational History  . veteran     disabledf   Social History Main Topics  . Smoking status: Former Smoker -- 1.00 packs/day for 50 years    Types: Cigarettes    Quit date: 01/15/2001  . Smokeless tobacco: Never Used  . Alcohol Use: No  . Drug Use: No  . Sexual Activity: Not on file   Other Topics Concern  . Not on file   Social History Narrative   Outpatient Encounter Prescriptions as of 06/03/2015  Medication Sig  . albuterol-ipratropium (COMBIVENT) 18-103 MCG/ACT inhaler Inhale 2 puffs into the lungs every 6 (six) hours as needed.    Marland Kitchen. escitalopram (LEXAPRO) 20 MG tablet Take 20 mg by mouth daily.    . fish oil-omega-3 fatty acids 1000 MG capsule Take 2 g by mouth daily.  . insulin glargine (LANTUS) 100 UNIT/ML injection Inject 20 Units into the skin at bedtime.  Marland Kitchen. levothyroxine (SYNTHROID, LEVOTHROID) 200 MCG tablet Take 200 mcg by  mouth daily.  Marland Kitchen. lisinopril (PRINIVIL,ZESTRIL) 40 MG tablet TAKE ONE TABLET BY MOUTH ONCE DAILY. (Patient taking differently: 20 mg)  . nebivolol (BYSTOLIC) 5 MG tablet Take 5 mg by mouth daily.    . simvastatin (ZOCOR) 40 MG tablet Take 40 mg by mouth at bedtime.    . sitaGLIPtin (JANUVIA) 50 MG tablet Take 50 mg by mouth daily.  Marland Kitchen. aspirin 81 MG tablet Take 81 mg by mouth daily.    . diazepam (VALIUM) 10 MG tablet Take 10 mg by mouth every 6 (six) hours as needed.    . docusate sodium (COLACE) 100 MG capsule Take 100 mg by mouth 2 (two) times daily.    . fenofibrate 160 MG tablet Take 160 mg by mouth daily.    Marland Kitchen. HYDROcodone-acetaminophen (NORCO) 10-325 MG per tablet Take 1 tablet by mouth every 6 (six) hours as needed.    . Multiple Vitamins-Minerals (MULTIVITAMIN WITH MINERALS) tablet Take 1 tablet by mouth daily.    . [DISCONTINUED] glipiZIDE (GLUCOTROL) 5 MG tablet Take 5 mg by mouth 2 (two) times daily.     No facility-administered encounter medications on file as of 06/03/2015.   ALLERGIES:  Allergies  Allergen Reactions  . Alprazolam   . Atorvastatin   . Fenofibrate   . Gemfibrozil   . Ibuprofen   . Invokana [Canagliflozin]    VACCINATION STATUS: Immunization History  Administered Date(s) Administered  . Influenza-Unspecified 05/13/2014    HPI   [name] is a 59- yr- old patient with medical history as above. Patient is here to f/u of his uncontrolled type 2 DM . Patient was diagnosed with type 2 DM approximately 5 yrs ago. Patient's recent A1c was found to be 9.6% consistent with uncontrolled status.  he was started on basal insulin, came with controlled EAG of 134 in a week. Patient reports improvement in his polydipsia, polyuria, and nocturia.   Also has HTN, HPL on treatment and has been overweight to obese most of his adult life. Patient has CAD s/p tripple bypass, CVA, CKD, Neuropathy, and Retinopathy.  Pt gives a family hx of type 2 DM in his mother.  Patient  is a former smoker, does not participate in a regular exercise program. Patient is willing to engage in intensive monitoring and therapy along with change in life style. Review of Systems  Objective:    BP 90/60 mmHg  Pulse 67  Ht 5\' 9"  (1.753 m)  Wt 272 lb (123.378 kg)  BMI 40.15 kg/m2  SpO2 95%  Wt Readings from Last 3 Encounters:  06/03/15 272 lb (123.378 kg)  05/27/15 274 lb (124.286 kg)  04/29/13 292 lb 12 oz (132.791 kg)    Physical Exam  Results for orders placed or performed in visit on 05/25/15  TSH + free T4  Result Value Ref Range   TSH 49.770 (H) 0.450 - 4.500 uIU/mL   Free T4 0.47 (L) 0.82 - 1.77 ng/dL  Comprehensive metabolic panel  Result Value Ref Range   Glucose 161 (H) 65 - 99 mg/dL   BUN 37 (H) 6 - 24 mg/dL   Creatinine, Ser 1.61 (H) 0.76 - 1.27 mg/dL   GFR calc non Af Amer 38 (L) >59 mL/min/1.73   GFR calc Af Amer 44 (L) >59 mL/min/1.73   BUN/Creatinine Ratio 19 9 - 20   Sodium 136 134 - 144 mmol/L   Potassium 5.3 (H) 3.5 - 5.2 mmol/L   Chloride 97 97 - 108 mmol/L   CO2 18 18 - 29 mmol/L   Calcium 10.7 (H) 8.7 - 10.2 mg/dL   Total Protein 8.0 6.0 - 8.5 g/dL   Albumin 5.0 3.5 - 5.5 g/dL   Globulin, Total 3.0 1.5 - 4.5 g/dL   Albumin/Globulin Ratio 1.7 1.1 - 2.5   Bilirubin Total 0.4 0.0 - 1.2 mg/dL   Alkaline Phosphatase 28 (L) 39 - 117 IU/L   AST 38 0 - 40 IU/L   ALT 51 (H) 0 - 44 IU/L  Lipid Panel w/o Chol/HDL Ratio  Result Value Ref Range   Cholesterol, Total 167 100 - 199 mg/dL   Triglycerides 096 (H) 0 - 149 mg/dL   HDL 41 >04 mg/dL   VLDL Cholesterol Cal 46 (H) 5 - 40 mg/dL   LDL Calculated 80 0 - 99 mg/dL  Hgb V4U w/o eAG  Result Value Ref Range   Hgb A1c MFr Bld 7.1 (H) 4.8 - 5.6 %  Vit D  25 hydroxy (rtn osteoporosis monitoring)  Result Value Ref Range   Vit D, 25-Hydroxy 26.0 (L) 30.0 - 100.0 ng/mL  Microalbumin, urine  Result Value Ref Range   Microalbum.,U,Random 22.4 Not Estab. ug/mL   Complete Blood  Count (Most  recent): Lab Results  Component Value Date   WBC 9.1 09/27/2010   HGB 13.2 09/27/2010   HCT 42.7 09/27/2010   MCV 95.1 09/27/2010   PLT 174 09/27/2010    Diabetic Labs (most recent): Lab Results  Component Value Date   HGBA1C 7.1* 05/25/2015   Lipid profile (most recent): Lab Results  Component Value Date   TRIG 230* 05/25/2015   CHOL 167 05/25/2015     Results for MIKAEEL, PETROW (MRN [id number]) as of 06/04/2015 04:00  Ref. Range 05/25/2015 08:07  Hemoglobin A1C Latest Ref Range: 4.8-5.6 % 7.1 (H)  Glucose Latest Ref Range: 65-99 mg/dL 409 (H)  TSH Latest Ref Range: 0.450-4.500 uIU/mL 49.770 (H)  Free T4 Latest Ref Range: 0.82-1.77 ng/dL 8.11 (L)      Assessment & Plan:   1. Type 2 diabetes mellitus with stage 3 chronic kidney disease, with long-term current use of insulin (HCC)  His diabetes is  complicated by  CAD, CKD.CVA, Retinopathy. Patient came with controlled  glucose profile, and  recent A1c of 7.1 % improved from 9.6%.  Glucose logs and insulin administration records pertaining to this visit,  to be scanned into patient's records.  Recent labs reviewed.  - Patient remains at a high risk for more acute and chronic complications of diabetes which include CAD, CVA, CKD, retinopathy, and neuropathy. These are all discussed in detail with the patient.  - I have re-counseled the patient on diet management and weight loss  by adopting a carbohydrate restricted / protein rich  Diet. - Patient is advised to stick to a routine mealtimes to eat 3 meals  a day and avoid unnecessary snacks ( to snack only to correct hypoglycemia).  - Suggestion is made for patient to avoid simple carbohydrates   from their diet including Cakes , Desserts, Ice Cream,  Soda (  diet and regular) , Sweet Tea , Candies,  Chips, Cookies, Artificial Sweeteners,   and "Sugar-free" Products .  This will help patient to have stable blood glucose profile and potentially avoid unintended  Weight  gain.  - The patient  Has been   scheduled with Norm Salt, RDN, CDE for individualized DM education. - I have approached patient with the following individualized plan to manage diabetes and patient agrees.  - I will continue basal insulin Lantus 20 units qhs, associated with monitoring of gluco BID. - He will not need prandial insulin based on his readings.   -Patient is encouraged to call clinic for blood glucose levels less than 70 or above 300 mg /dl.  -Due to CKD patient is not a candidate for MTF, SGLT2i. -I advised him to continue Januvia  po qday. -Patient will be considered for incretin therapy as appropriate next visit. -Target numbers for A1c, LDL, HDL, Triglycerides, Waist Circumference were discussed in detail.   2) BP/HTN: tightly controlled at 90/60, he would like to address medications with his cardiologist.  Continue current medications including ACEI. 3) Lipids/HPL: Recent LDL at target levels 80, continue statins. 4)  Weight/Diet: CDE consult in progress, exercise, and carbohydrates information provided. 5) Hypothyroidism: he has not been consistent taking his LT4. TFTs suggestive of profound hypothyroidism.   I advised him to resume and take his LT4 200 mcg po qam.  - We discussed about correct intake of levothyroxine, at fasting, with water, separated by at least 30 minutes from breakfast, and separated by more than 4 hours from calcium, iron, multivitamins, acid reflux medications (PPIs). -Patient  is made aware of the fact that thyroid hormone replacement is needed for life, dose to be adjusted by periodic monitoring of thyroid function tests.  6) Chronic Care/Health Maintenance:  -Patient  on ACEI and Statin medications and encouraged to continue to follow up with Ophthalmology, Podiatrist at least yearly or according to recommendations, and advised to  stay away from smoking. I have recommended yearly flu vaccine and pneumonia vaccination at least every 5  years; moderate intensity exercise for up to 150 minutes weekly; and  sleep for at least 7 hours a day.  I advised patient to maintain close follow up with their PCP for primary care needs.  Patient is asked to bring meter and  blood glucose logs during their next visit.   Follow up plan: Return in about 3 months (around 09/03/2015) for diabetes, high blood pressure, high cholesterol, underactive thyroid.  Marquis Lunch, MD Phone: [phone]  Fax: [phone]   06/04/2015, 3:52 AM

## 2015-06-09 DIAGNOSIS — H524 Presbyopia: Secondary | ICD-10-CM | POA: Diagnosis not present

## 2015-06-09 DIAGNOSIS — H5203 Hypermetropia, bilateral: Secondary | ICD-10-CM | POA: Diagnosis not present

## 2015-06-09 DIAGNOSIS — H52221 Regular astigmatism, right eye: Secondary | ICD-10-CM | POA: Diagnosis not present

## 2015-06-14 DIAGNOSIS — M47816 Spondylosis without myelopathy or radiculopathy, lumbar region: Secondary | ICD-10-CM | POA: Diagnosis not present

## 2015-08-16 ENCOUNTER — Telehealth: Payer: Self-pay

## 2015-08-16 DIAGNOSIS — Z79899 Other long term (current) drug therapy: Secondary | ICD-10-CM | POA: Diagnosis not present

## 2015-08-16 DIAGNOSIS — I10 Essential (primary) hypertension: Secondary | ICD-10-CM | POA: Diagnosis not present

## 2015-08-16 DIAGNOSIS — R809 Proteinuria, unspecified: Secondary | ICD-10-CM | POA: Diagnosis not present

## 2015-08-16 DIAGNOSIS — N183 Chronic kidney disease, stage 3 (moderate): Secondary | ICD-10-CM | POA: Diagnosis not present

## 2015-08-16 DIAGNOSIS — D509 Iron deficiency anemia, unspecified: Secondary | ICD-10-CM | POA: Diagnosis not present

## 2015-08-16 DIAGNOSIS — E559 Vitamin D deficiency, unspecified: Secondary | ICD-10-CM | POA: Diagnosis not present

## 2015-08-16 NOTE — Telephone Encounter (Signed)
He can stay off of it until January 18, that's okay.

## 2015-08-16 NOTE — Telephone Encounter (Signed)
Pt states that he is out of [place] too early. He cannot refill unitl 08-31-15. He states he doesn't know why he is out early. He wants to know what to do until then?

## 2015-08-16 NOTE — Telephone Encounter (Signed)
Pt.notified

## 2015-08-18 DIAGNOSIS — N183 Chronic kidney disease, stage 3 (moderate): Secondary | ICD-10-CM | POA: Diagnosis not present

## 2015-08-18 DIAGNOSIS — I10 Essential (primary) hypertension: Secondary | ICD-10-CM | POA: Diagnosis not present

## 2015-08-24 DIAGNOSIS — N183 Chronic kidney disease, stage 3 (moderate): Secondary | ICD-10-CM | POA: Diagnosis not present

## 2015-08-24 DIAGNOSIS — E559 Vitamin D deficiency, unspecified: Secondary | ICD-10-CM | POA: Diagnosis not present

## 2015-08-29 ENCOUNTER — Ambulatory Visit: Payer: Self-pay | Admitting: Nutrition

## 2015-08-31 ENCOUNTER — Other Ambulatory Visit: Payer: Self-pay | Admitting: "Endocrinology

## 2015-08-31 DIAGNOSIS — N183 Chronic kidney disease, stage 3 (moderate): Secondary | ICD-10-CM | POA: Diagnosis not present

## 2015-08-31 DIAGNOSIS — Z794 Long term (current) use of insulin: Secondary | ICD-10-CM | POA: Diagnosis not present

## 2015-08-31 DIAGNOSIS — E1122 Type 2 diabetes mellitus with diabetic chronic kidney disease: Secondary | ICD-10-CM | POA: Diagnosis not present

## 2015-08-31 DIAGNOSIS — E038 Other specified hypothyroidism: Secondary | ICD-10-CM | POA: Diagnosis not present

## 2015-09-01 LAB — BASIC METABOLIC PANEL
BUN/Creatinine Ratio: 15 (ref 9–20)
BUN: 18 mg/dL (ref 6–24)
CO2: 25 mmol/L (ref 18–29)
Calcium: 9.2 mg/dL (ref 8.7–10.2)
Chloride: 104 mmol/L (ref 96–106)
Creatinine, Ser: 1.24 mg/dL (ref 0.76–1.27)
GFR calc Af Amer: 73 mL/min/{1.73_m2} (ref >59)
GFR calc non Af Amer: 63 mL/min/{1.73_m2} (ref >59)
GLUCOSE: 182 mg/dL — AB (ref 65–99)
Potassium: 4.4 mmol/L (ref 3.5–5.2)
SODIUM: 144 mmol/L (ref 134–144)

## 2015-09-01 LAB — HGB A1C W/O EAG: HEMOGLOBIN A1C: 7.5 % — AB (ref 4.8–5.6)

## 2015-09-01 LAB — T4, FREE: FREE T4: 1.03 ng/dL (ref 0.82–1.77)

## 2015-09-01 LAB — TSH: TSH: 0.546 u[IU]/mL (ref 0.450–4.500)

## 2015-09-07 ENCOUNTER — Ambulatory Visit (INDEPENDENT_AMBULATORY_CARE_PROVIDER_SITE_OTHER): Payer: Medicare Other | Admitting: "Endocrinology

## 2015-09-07 ENCOUNTER — Encounter: Payer: Self-pay | Admitting: "Endocrinology

## 2015-09-07 VITALS — BP 147/83 | HR 69 | Ht 69.0 in | Wt 267.0 lb

## 2015-09-07 DIAGNOSIS — E038 Other specified hypothyroidism: Secondary | ICD-10-CM

## 2015-09-07 DIAGNOSIS — E1159 Type 2 diabetes mellitus with other circulatory complications: Secondary | ICD-10-CM | POA: Diagnosis not present

## 2015-09-07 DIAGNOSIS — E785 Hyperlipidemia, unspecified: Secondary | ICD-10-CM | POA: Diagnosis not present

## 2015-09-07 DIAGNOSIS — I10 Essential (primary) hypertension: Secondary | ICD-10-CM | POA: Diagnosis not present

## 2015-09-07 DIAGNOSIS — E669 Obesity, unspecified: Secondary | ICD-10-CM

## 2015-09-07 MED ORDER — LEVOTHYROXINE SODIUM 200 MCG PO TABS: 200.0000 ug | ORAL_TABLET | Freq: Every day | ORAL | Status: DC

## 2015-09-07 MED ORDER — INSULIN GLARGINE 100 UNIT/ML SOLOSTAR PEN: 26.0000 [IU] | PEN_INJECTOR | Freq: Every day | SUBCUTANEOUS | Status: DC

## 2015-09-07 MED ORDER — SITAGLIPTIN PHOSPHATE 50 MG PO TABS: 50.0000 mg | ORAL_TABLET | Freq: Every day | ORAL | Status: DC

## 2015-09-07 NOTE — Progress Notes (Signed)
Subjective:    Patient ID: [name], male    DOB: [birth year],    Past Medical History  Diagnosis Date  . ASCVD (arteriosclerotic cardiovascular disease)     -MI in 01/2001 prompted CABG; EF-30% at cath; 50% on echo in 7/02 and normal in 2005  . Cerebrovascular disease      L CEA 06/2004 following left renal embolism; 10/2008 plaque w/o focal stenosis  . Hyperlipidemia   . Tobacco abuse, in remission     50 pack years; discontinued in 2002  . Diabetes mellitus     -no insulin  . Chronic obstructive pulmonary disease (HCC)   . Obesity   . Hypothyroidism   . Degenerative joint disease      chronic LBP-s/p L3-4 fusion  . Hypertension   . Restless leg syndrome     Possible   Past Surgical History  Procedure Laterality Date  . Lumbar spine surgery      L3-4 fusion  . Coronary artery bypass graft  2002  . Carotid endarterectomy  2005    Left  . Cataract extraction      bilateral   Social History   Social History  . Marital Status: Divorced    Spouse Name: N/A  . Number of Children: 1  . Years of Education: N/A   Occupational History  . veteran     disabledf   Social History Main Topics  . Smoking status: Former Smoker -- 1.00 packs/day for 50 years    Types: Cigarettes    Quit date: 01/15/2001  . Smokeless tobacco: Never Used  . Alcohol Use: No  . Drug Use: No  . Sexual Activity: Not Asked   Other Topics Concern  . None   Social History Narrative   Outpatient Encounter Prescriptions as of 09/07/2015  Medication Sig  . albuterol-ipratropium (COMBIVENT) 18-103 MCG/ACT inhaler Inhale 2 puffs into the lungs every 6 (six) hours as needed.    Marland Kitchen aspirin 81 MG tablet Take 81 mg by mouth daily.    . B Complex-Biotin-FA (VITAMIN B50 COMPLEX PO) Take by mouth daily.  . Cranberry 400 MG TABS Take by mouth daily.  . diazepam (VALIUM) 10 MG tablet Take 10 mg by mouth every 6 (six) hours as needed.    . docusate sodium (COLACE) 100 MG capsule Take 100 mg by  mouth 2 (two) times daily.    Marland Kitchen escitalopram (LEXAPRO) 20 MG tablet Take 20 mg by mouth daily.    . fenofibrate 160 MG tablet Take 160 mg by mouth daily.    . fish oil-omega-3 fatty acids 1000 MG capsule Take 2 g by mouth daily.  Marland Kitchen HYDROcodone-acetaminophen (NORCO) 10-325 MG per tablet Take 1 tablet by mouth every 6 (six) hours as needed.    . Insulin Glargine (LANTUS SOLOSTAR) 100 UNIT/ML Solostar Pen Inject 26 Units into the skin at bedtime.  Marland Kitchen levothyroxine (SYNTHROID, LEVOTHROID) 200 MCG tablet Take 1 tablet (200 mcg total) by mouth daily.  . Multiple Vitamins-Minerals (MULTIVITAMIN WITH MINERALS) tablet Take 1 tablet by mouth daily.    . simvastatin (ZOCOR) 40 MG tablet Take 40 mg by mouth at bedtime.    . sitaGLIPtin (JANUVIA) 50 MG tablet Take 1 tablet (50 mg total) by mouth daily.  . vitamin B-12 (CYANOCOBALAMIN) 100 MCG tablet Take 100 mcg by mouth daily.  . [DISCONTINUED] Insulin Glargine (LANTUS SOLOSTAR) 100 UNIT/ML Solostar Pen Inject 26 Units into the skin at bedtime.  . [DISCONTINUED] levothyroxine (SYNTHROID, LEVOTHROID) 200  MCG tablet Take 200 mcg by mouth daily.  . [DISCONTINUED] sitaGLIPtin (JANUVIA) 50 MG tablet Take 50 mg by mouth daily.  Marland Kitchen lisinopril (PRINIVIL,ZESTRIL) 40 MG tablet TAKE ONE TABLET BY MOUTH ONCE DAILY. (Patient not taking: Reported on 09/07/2015)  . nebivolol (BYSTOLIC) 5 MG tablet Take 5 mg by mouth daily. Reported on 09/07/2015  . [DISCONTINUED] insulin glargine (LANTUS) 100 UNIT/ML injection Inject 20 Units into the skin at bedtime. Reported on 09/07/2015   No facility-administered encounter medications on file as of 09/07/2015.   ALLERGIES: Allergies  Allergen Reactions  . Alprazolam   . Atorvastatin   . Fenofibrate   . Gemfibrozil   . Ibuprofen   . Invokana [Canagliflozin]    VACCINATION STATUS: Immunization History  Administered Date(s) Administered  . Influenza-Unspecified 05/13/2014    HPI   [name] is a 60- yr- old patient with  medical history as above. Patient is here to f/u of his uncontrolled type 2 DM, hypothyroidism, hypertension and hyperlipidemia . Patient was diagnosed with type 2 DM approximately 60 yrs ago. Patient's recent A1c has improved to 7.5% from 9.6%.   -He Remains on  basal insulin 20 units daily at bedtime along with Januvia 50 mg daily, came with controlled EAG of 157 in aa month . Patient reports improvement in his polydipsia, polyuria, and nocturia.   Also has HTN, HPL on treatment and has been overweight to obese most of his adult life. Patient has CAD s/p tripple bypass, CVA, CKD, Neuropathy, and Retinopathy.   his recent renal function test shows near-normal profile.  Pt gives a family hx of type 2 DM in his mother.  Patient is a former smoker, does not participate in a regular exercise program. Patient is willing to engage in intensive monitoring and therapy along with change in life style. Review of Systems  Constitutional: Negative for fever, chills, fatigue and unexpected weight change.  HENT: Negative for dental problem, mouth sores and trouble swallowing.   Eyes: Negative for visual disturbance.  Respiratory: Negative for cough, choking, chest tightness, shortness of breath and wheezing.   Cardiovascular: Negative for chest pain, palpitations and leg swelling.  Gastrointestinal: Negative for nausea, vomiting, abdominal pain, diarrhea, constipation and abdominal distention.  Endocrine: Negative for polydipsia, polyphagia and polyuria.  Genitourinary: Negative for dysuria, urgency, hematuria and flank pain.  Musculoskeletal: Negative for myalgias, back pain, gait problem and neck pain.  Skin: Negative for pallor, rash and wound.  Neurological: Negative for seizures, syncope, weakness, numbness and headaches.  Psychiatric/Behavioral: Negative.  Negative for confusion and dysphoric mood.    Objective:    BP 147/83 mmHg  Pulse 69  Ht  (1.753 m)  Wt 267 lb (121.11 kg)  BMI 39.41  kg/m2  SpO2 98%  Wt Readings from Last 3 Encounters:  09/07/15 267 lb (121.11 kg)  06/03/15 272 lb (123.378 kg)  05/27/15 274 lb (124.286 kg)    Physical Exam  Constitutional: He is oriented to person, place, and time. He appears well-developed and well-nourished. He is cooperative. No distress.  HENT:  Head: Normocephalic and atraumatic.  Eyes: EOM are normal.  Neck: Normal range of motion. Neck supple. No tracheal deviation present. No thyromegaly present.  Cardiovascular: Normal rate, S1 normal, S2 normal and normal heart sounds.  Exam reveals no gallop.   No murmur heard. Pulses:      Dorsalis pedis pulses are 1+ on the right side, and 1+ on the left side.       Posterior tibial  pulses are 1+ on the right side, and 1+ on the left side.  Pulmonary/Chest: Breath sounds normal. No respiratory distress. He has no wheezes.  Abdominal: Soft. Bowel sounds are normal. He exhibits no distension. There is no tenderness. There is no guarding and no CVA tenderness.  Musculoskeletal: He exhibits no edema.       Right shoulder: He exhibits no swelling and no deformity.  Neurological: He is alert and oriented to person, place, and time. He has normal strength and normal reflexes. No cranial nerve deficit or sensory deficit. Gait normal.  Skin: Skin is warm and dry. No rash noted. No cyanosis. Nails show no clubbing.  Psychiatric: He has a normal mood and affect. His speech is normal and behavior is normal. Judgment and thought content normal. Cognition and memory are normal.    Results for orders placed or performed in visit on 08/31/15  Basic metabolic panel  Result Value Ref Range   Glucose 182 (H) 65 - 99 mg/dL   BUN 18 6 - 24 mg/dL   Creatinine, Ser 1.61 0.76 - 1.27 mg/dL   GFR calc non Af Amer 63 >59 mL/min/1.73   GFR calc Af Amer 73 >59 mL/min/1.73   BUN/Creatinine Ratio 15 9 - 20   Sodium 144 134 - 144 mmol/L   Potassium 4.4 3.5 - 5.2 mmol/L   Chloride 104 96 - 106 mmol/L   CO2 25  18 - 29 mmol/L   Calcium 9.2 8.7 - 10.2 mg/dL  Hgb W9U w/o eAG  Result Value Ref Range   Hgb A1c MFr Bld 7.5 (H) 4.8 - 5.6 %  T4, free  Result Value Ref Range   Free T4 1.03 0.82 - 1.77 ng/dL  TSH  Result Value Ref Range   TSH 0.546 0.450 - 4.500 uIU/mL   Complete Blood Count (Most recent): Lab Results  Component Value Date   WBC 9.1 09/27/2010   HGB 13.2 09/27/2010   HCT 42.7 09/27/2010   MCV 95.1 09/27/2010   PLT 174 09/27/2010    Diabetic Labs (most recent): Lab Results  Component Value Date   HGBA1C 7.5* 08/31/2015   HGBA1C 7.1* 05/25/2015     Lipid Panel     Component Value Date/Time   CHOL 167 05/25/2015 0807   CHOL 140 09/27/2010   TRIG 230* 05/25/2015 0807   TRIG 329 10/02/2007   HDL 41 05/25/2015 0807   HDL 34 09/27/2010   CHOLHDL 3.4 Ratio 12/26/2009 1848   VLDL 39 12/26/2009 1848   LDLCALC 80 05/25/2015 0807   LDLCALC 62 09/27/2010   LDLCALC 66 10/02/2007       Assessment & Plan:   1. Type 2 diabetes mellitus with stage 3 chronic kidney disease, with long-term current use of insulin (HCC)  His diabetes is  complicated by  CAD, CKD.CVA, Retinopathy. his renal function is improving. Patient came with controlled  glucose profile, and  recent A1c of 7.5 % improved from 9.6%.  Glucose logs and insulin administration records pertaining to this visit,  to be scanned into patient's records.  Recent labs reviewed.  - Patient remains at a high risk for more acute and chronic complications of diabetes which include CAD, CVA, CKD, retinopathy, and neuropathy. These are all discussed in detail with the patient.  - I have re-counseled the patient on diet management and weight loss  by adopting a carbohydrate restricted / protein rich  Diet. - Patient is advised to stick to a routine mealtimes to eat 3  meals  a day and avoid unnecessary snacks ( to snack only to correct hypoglycemia).  - Suggestion is made for patient to avoid simple carbohydrates   from their  diet including Cakes , Desserts, Ice Cream,  Soda (  diet and regular) , Sweet Tea , Candies,  Chips, Cookies, Artificial Sweeteners,   and "Sugar-free" Products .  This will help patient to have stable blood glucose profile and potentially avoid unintended  Weight gain.  - The patient  Has been   scheduled with Norm Salt, RDN, CDE for individualized DM education. - I have approached patient with the following individualized plan to manage diabetes and patient agrees.  - I will  increase insulin Lantus 26 units qhs, associated with monitoring of gluco BID. - He will not need prandial insulin based on his readings.   -Patient is encouraged to call clinic for blood glucose levels less than 70 or above 300 mg /dl.  -Due to CKD patient is not a candidate for MTF, SGLT2i. He has history of metformin intolerance.  -I advised him to continue Januvia  po qday. -Patient will be considered for incretin therapy as appropriate next visit. -Target numbers for A1c, LDL, HDL, Triglycerides, Waist Circumference were discussed in detail.   2) BP/HTN: tightly controlled at 90/60, he would like to address medications with his cardiologist.  Continue current medications including ACEI. 3) Lipids/HPL: Recent LDL at target levels 80, continue statins. 4)  Weight/Diet: CDE consult in progress, exercise, and carbohydrates information provided. 5) Hypothyroidism: he has not been consistent taking his LT4. TFTs suggestive of profound hypothyroidism.   I advised him to resume and take his LT4 200 mcg po qam.   - We discussed about correct intake of levothyroxine, at fasting, with water, separated by at least 30 minutes from breakfast, and separated by more than 4 hours from calcium, iron, multivitamins, acid reflux medications (PPIs). -Patient is made aware of the fact that thyroid hormone replacement is needed for life, dose to be adjusted by periodic monitoring of thyroid function tests.  6) Chronic  Care/Health Maintenance:  -Patient  on ACEI and Statin medications and encouraged to continue to follow up with Ophthalmology, Podiatrist at least yearly or according to recommendations, and advised to  stay away from smoking. I have recommended yearly flu vaccine and pneumonia vaccination at least every 5 years; moderate intensity exercise for up to 150 minutes weekly; and  sleep for at least 7 hours a day.  I advised patient to maintain close follow up with their PCP for primary care needs.  Patient is asked to bring meter and  blood glucose logs during their next visit.   Follow up plan: Return in about 3 months (around 12/06/2015) for diabetes, high blood pressure, high cholesterol, underactive thyroid, follow up with pre-visit labs, meter, and logs.  Marquis Lunch, MD Phone: [phone]  Fax: [phone]   09/07/2015, 3:11 PM

## 2015-09-07 NOTE — Patient Instructions (Signed)

## 2015-09-28 DIAGNOSIS — M47816 Spondylosis without myelopathy or radiculopathy, lumbar region: Secondary | ICD-10-CM | POA: Diagnosis not present

## 2015-10-06 DIAGNOSIS — Z0000 Encounter for general adult medical examination without abnormal findings: Secondary | ICD-10-CM | POA: Diagnosis not present

## 2015-10-06 DIAGNOSIS — E1122 Type 2 diabetes mellitus with diabetic chronic kidney disease: Secondary | ICD-10-CM | POA: Diagnosis not present

## 2015-10-06 DIAGNOSIS — E1165 Type 2 diabetes mellitus with hyperglycemia: Secondary | ICD-10-CM | POA: Diagnosis not present

## 2015-10-20 DIAGNOSIS — Z1389 Encounter for screening for other disorder: Secondary | ICD-10-CM | POA: Diagnosis not present

## 2015-10-20 DIAGNOSIS — E1165 Type 2 diabetes mellitus with hyperglycemia: Secondary | ICD-10-CM | POA: Diagnosis not present

## 2015-10-20 DIAGNOSIS — E1122 Type 2 diabetes mellitus with diabetic chronic kidney disease: Secondary | ICD-10-CM | POA: Diagnosis not present

## 2015-10-20 DIAGNOSIS — E782 Mixed hyperlipidemia: Secondary | ICD-10-CM | POA: Diagnosis not present

## 2015-10-20 DIAGNOSIS — Z0000 Encounter for general adult medical examination without abnormal findings: Secondary | ICD-10-CM | POA: Diagnosis not present

## 2015-11-02 DIAGNOSIS — Z1389 Encounter for screening for other disorder: Secondary | ICD-10-CM | POA: Diagnosis not present

## 2015-11-02 DIAGNOSIS — I10 Essential (primary) hypertension: Secondary | ICD-10-CM | POA: Diagnosis not present

## 2015-11-02 DIAGNOSIS — E063 Autoimmune thyroiditis: Secondary | ICD-10-CM | POA: Diagnosis not present

## 2015-11-02 DIAGNOSIS — E1122 Type 2 diabetes mellitus with diabetic chronic kidney disease: Secondary | ICD-10-CM | POA: Diagnosis not present

## 2015-11-02 DIAGNOSIS — J0140 Acute pansinusitis, unspecified: Secondary | ICD-10-CM | POA: Diagnosis not present

## 2015-11-07 ENCOUNTER — Emergency Department (HOSPITAL_COMMUNITY)
Admission: EM | Admit: 2015-11-07 | Discharge: 2015-11-07 | Disposition: A | Discharge status: Home / Self Care | Payer: Medicare Other | Attending: Emergency Medicine | Admitting: Emergency Medicine

## 2015-11-07 ENCOUNTER — Emergency Department (HOSPITAL_COMMUNITY): Payer: Medicare Other

## 2015-11-07 ENCOUNTER — Encounter (HOSPITAL_COMMUNITY): Payer: Self-pay | Admitting: Emergency Medicine

## 2015-11-07 DIAGNOSIS — M545 Low back pain: Secondary | ICD-10-CM | POA: Diagnosis not present

## 2015-11-07 DIAGNOSIS — Y999 Unspecified external cause status: Secondary | ICD-10-CM | POA: Insufficient documentation

## 2015-11-07 DIAGNOSIS — Y929 Unspecified place or not applicable: Secondary | ICD-10-CM | POA: Diagnosis not present

## 2015-11-07 DIAGNOSIS — Y9301 Activity, walking, marching and hiking: Secondary | ICD-10-CM | POA: Insufficient documentation

## 2015-11-07 DIAGNOSIS — E785 Hyperlipidemia, unspecified: Secondary | ICD-10-CM | POA: Insufficient documentation

## 2015-11-07 DIAGNOSIS — S32029A Unspecified fracture of second lumbar vertebra, initial encounter for closed fracture: Secondary | ICD-10-CM | POA: Insufficient documentation

## 2015-11-07 DIAGNOSIS — J449 Chronic obstructive pulmonary disease, unspecified: Secondary | ICD-10-CM | POA: Insufficient documentation

## 2015-11-07 DIAGNOSIS — E119 Type 2 diabetes mellitus without complications: Secondary | ICD-10-CM | POA: Diagnosis not present

## 2015-11-07 DIAGNOSIS — E669 Obesity, unspecified: Secondary | ICD-10-CM | POA: Diagnosis not present

## 2015-11-07 DIAGNOSIS — W102XXA Fall (on)(from) incline, initial encounter: Secondary | ICD-10-CM | POA: Insufficient documentation

## 2015-11-07 DIAGNOSIS — E039 Hypothyroidism, unspecified: Secondary | ICD-10-CM | POA: Diagnosis not present

## 2015-11-07 DIAGNOSIS — Z79891 Long term (current) use of opiate analgesic: Secondary | ICD-10-CM | POA: Insufficient documentation

## 2015-11-07 DIAGNOSIS — I10 Essential (primary) hypertension: Secondary | ICD-10-CM | POA: Insufficient documentation

## 2015-11-07 DIAGNOSIS — W19XXXA Unspecified fall, initial encounter: Secondary | ICD-10-CM

## 2015-11-07 DIAGNOSIS — S3992XA Unspecified injury of lower back, initial encounter: Secondary | ICD-10-CM | POA: Diagnosis not present

## 2015-11-07 DIAGNOSIS — S32020A Wedge compression fracture of second lumbar vertebra, initial encounter for closed fracture: Secondary | ICD-10-CM | POA: Diagnosis not present

## 2015-11-07 MED ORDER — OXYCODONE-ACETAMINOPHEN 5-325 MG PO TABS: 1.0000 | ORAL_TABLET | ORAL | Status: DC | PRN

## 2015-11-07 MED ORDER — OXYCODONE-ACETAMINOPHEN 5-325 MG PO TABS
1.0000 | ORAL_TABLET | Freq: Once | ORAL | Status: AC
  Administered 2015-11-07: 1 via ORAL
  Filled 2015-11-07: qty 1

## 2015-11-07 NOTE — ED Notes (Signed)
PT c/o lower back pain since tripping and falling down 3 steps on 10/21/15. PT stated his neurosurgeon's office told him to come to ED for pain control and possible xrays. PT ambulatory in triage with NAD noted.

## 2015-11-07 NOTE — ED Notes (Signed)
Called to room no answer from lobby.

## 2015-11-07 NOTE — Discharge Instructions (Signed)
Call Dr. Channing Muttersoy tomorrow for follow up.

## 2015-11-07 NOTE — ED Provider Notes (Signed)
CSN: [id number]     Arrival date & time 11/07/15  1624 History  By signing my name below, I, [name], attest that this documentation has been prepared under the direction and in the presence of Alan Buffalo, NP.  Electronically Signed: Lyndel Williamson, ED Scribe. 11/07/2015. 6:14 PM.   Chief Complaint  Patient presents with  . Fall   Patient is a 60 y.o. male presenting with back pain. The history is provided by the patient. No language interpreter was used.  Back Pain Location:  Lumbar spine Radiates to:  Does not radiate Pain severity:  Severe Pain is:  Same all the time Duration:  17 days Timing:  Constant Progression:  Worsening Chronicity:  New Context: falling   Relieved by:  Nothing Worsened by:  Ambulation and movement Ineffective treatments:  Narcotics and muscle relaxants Associated symptoms: no bladder incontinence, no bowel incontinence, no numbness, no paresthesias, no perianal numbness, no tingling and no weakness    HPI Comments: [name] is a 60 y.o. male, with a PMhx of degenerative joint disease s/p L3-L4 fusion, who presents to the Emergency Department complaining of constant, moderate lower back pain s/p mechanical fall that occurred 17 days ago when he was walking down a ramp and fell onto his left hip. Since the fall he has been ambulatory with a cane, which is his baseline. He notes increased pain with ambulation, movement, and standing up.  Pt admits to frequent falls due to neuropathy. He also notes chronic back pain due to degenerative disc disease but states his current pain is different from chronic back pain. He has a PShx to lumbar spine over 15 years ago, performed by Dr. Channing Mutters, whom he follows up with every 3 months. He is prescribed Vicodin and a muscle relaxer which he has been taking without relief of pain.  Pt called neurosurgeon's office today who advised him to come to the ED for pain management.    Past Medical History  Diagnosis Date  .  ASCVD (arteriosclerotic cardiovascular disease)     -MI in 01/2001 prompted CABG; EF-30% at cath; 50% on echo in 7/02 and normal in 2005  . Cerebrovascular disease      L CEA 06/2004 following left renal embolism; 10/2008 plaque w/o focal stenosis  . Hyperlipidemia   . Tobacco abuse, in remission     50 pack years; discontinued in 2002  . Diabetes mellitus     -no insulin  . Chronic obstructive pulmonary disease (HCC)   . Obesity   . Hypothyroidism   . Degenerative joint disease      chronic LBP-s/p L3-4 fusion  . Hypertension   . Restless leg syndrome     Possible   Past Surgical History  Procedure Laterality Date  . Lumbar spine surgery      L3-4 fusion  . Coronary artery bypass graft  2002  . Carotid endarterectomy  2005    Left  . Cataract extraction      bilateral   History reviewed. No pertinent family history. Social History  Substance Use Topics  . Smoking status: Former Smoker -- 1.00 packs/day for 50 years    Types: Cigarettes    Quit date: 01/15/2001  . Smokeless tobacco: Never Used  . Alcohol Use: No    Review of Systems  Gastrointestinal: Negative for bowel incontinence.  Genitourinary: Negative for bladder incontinence.  Musculoskeletal: Positive for back pain.  Neurological: Negative for tingling, weakness, numbness and paresthesias.  All other systems reviewed  and are negative.  Allergies  Alprazolam; Atorvastatin; Fenofibrate; Gemfibrozil; Ibuprofen; and Invokana  Home Medications   Prior to Admission medications   Medication Sig Start Date End Date Taking? Authorizing Provider  albuterol-ipratropium (COMBIVENT) 18-103 MCG/ACT inhaler Inhale 2 puffs into the lungs every 6 (six) hours as needed.      Historical Provider, MD  aspirin 81 MG tablet Take 81 mg by mouth daily.      Historical Provider, MD  B Complex-Biotin-FA (VITAMIN B50 COMPLEX PO) Take by mouth daily.    Historical Provider, MD  Cranberry 400 MG TABS Take by mouth daily.     Historical Provider, MD  diazepam (VALIUM) 10 MG tablet Take 10 mg by mouth every 6 (six) hours as needed.      Historical Provider, MD  docusate sodium (COLACE) 100 MG capsule Take 100 mg by mouth 2 (two) times daily.      Historical Provider, MD  escitalopram (LEXAPRO) 20 MG tablet Take 20 mg by mouth daily.      Historical Provider, MD  fenofibrate 160 MG tablet Take 160 mg by mouth daily.      Historical Provider, MD  fish oil-omega-3 fatty acids 1000 MG capsule Take 2 g by mouth daily.    Historical Provider, MD  HYDROcodone-acetaminophen (NORCO) 10-325 MG per tablet Take 1 tablet by mouth every 6 (six) hours as needed.      Historical Provider, MD  Insulin Glargine (LANTUS SOLOSTAR) 100 UNIT/ML Solostar Pen Inject 26 Units into the skin at bedtime. 09/07/15   Roma KayserGebreselassie W Nida, MD  levothyroxine (SYNTHROID, LEVOTHROID) 200 MCG tablet Take 1 tablet (200 mcg total) by mouth daily. 09/07/15   Roma KayserGebreselassie W Nida, MD  lisinopril (PRINIVIL,ZESTRIL) 40 MG tablet TAKE ONE TABLET BY MOUTH ONCE DAILY. Patient not taking: Reported on 09/07/2015 10/31/12   Kathlen Brunswickobert M Rothbart, MD  Multiple Vitamins-Minerals (MULTIVITAMIN WITH MINERALS) tablet Take 1 tablet by mouth daily.      Historical Provider, MD  nebivolol (BYSTOLIC) 5 MG tablet Take 5 mg by mouth daily. Reported on 09/07/2015    Historical Provider, MD  oxyCODONE-acetaminophen (ROXICET) 5-325 MG tablet Take 1 tablet by mouth every 4 (four) hours as needed for severe pain. 11/07/15   Hope Orlene OchM Neese, NP  simvastatin (ZOCOR) 40 MG tablet Take 40 mg by mouth at bedtime.      Historical Provider, MD  sitaGLIPtin (JANUVIA) 50 MG tablet Take 1 tablet (50 mg total) by mouth daily. 09/07/15   Roma KayserGebreselassie W Nida, MD  vitamin B-12 (CYANOCOBALAMIN) 100 MCG tablet Take 100 mcg by mouth daily.    Historical Provider, MD   BP 136/75 mmHg  Pulse 83  Temp(Src) 98 F (36.7 C) (Oral)  Resp 18  Ht 5\' 9"  (1.753 m)  Wt 261 lb (118.389 kg)  BMI 38.53 kg/m2  SpO2  96% Physical Exam  Constitutional: He is oriented to person, place, and time. He appears well-developed and well-nourished. No distress.  HENT:  Head: Normocephalic and atraumatic.  Eyes: EOM are normal.  Neck: Normal range of motion. Neck supple.  Cardiovascular: Normal rate and regular rhythm.   DP pulses 2+ bilaterally.   Pulmonary/Chest: Effort normal. No respiratory distress.  Abdominal: Soft. Bowel sounds are normal. There is no tenderness.  Musculoskeletal: Normal range of motion. He exhibits tenderness. He exhibits no edema.       Lumbar back: He exhibits tenderness and bony tenderness. He exhibits normal range of motion, no deformity and normal pulse.  Back:  Left lumbar area TTP.   Neurological: He is alert and oriented to person, place, and time. No sensory deficit.  Reflex Scores:      Bicep reflexes are 2+ on the right side and 2+ on the left side.      Brachioradialis reflexes are 2+ on the right side and 2+ on the left side.      Patellar reflexes are 2+ on the right side and 2+ on the left side. Patient walks with a cane and has chronic problems with ambulation.   Skin: Skin is warm and dry.  Psychiatric: He has a normal mood and affect. His behavior is normal.  Nursing note and vitals reviewed.   ED Course  Procedures  DIAGNOSTIC STUDIES: Oxygen Saturation is 96% on RA, adequate by my interpretation.    COORDINATION OF CARE: 6:07 PM Discussed treatment plan with pt at bedside and pt agreed to plan. He has follow up with Dr. Channing Mutters who performed previous lumbar fusion surgery.    Imaging Review Dg Lumbar Spine Complete  11/07/2015  CLINICAL DATA:  Low back pain status post fall EXAM: LUMBAR SPINE - COMPLETE 4+ VIEW COMPARISON:  None. FINDINGS: There are 5 nonrib bearing lumbar-type vertebral bodies. Minimal depression of the central superior endplate of L2 which may reflect a mild acute compression fracture. The alignment is anatomic. There is no spondylolysis.  There is no static listhesis. There is no acute fracture. Interbody fusion at L3-4. Bilateral facet arthropathy at L3-4, L4-5 and L5-S1. The SI joints are unremarkable. There is abdominal aortic atherosclerosis. IMPRESSION: 1. Minimal depression of the central superior endplate of L2 which may reflect a mild acute compression fracture. Electronically Signed   By: Elige Ko   On: 11/07/2015 17:52   I have personally reviewed and evaluated these images results as part of my medical decision-making.   MDM  60 y.o. male with hx of chronic low back pain with new injury 3/10 that has not improved with treatment at home. Stable for d/c to f/u with Dr. Channing Mutters. Will treat with Percocet until he can see Dr. Channing Mutters. Discussed with the patient and all questioned fully answered.    Final diagnoses:  Compression fracture of L2 lumbar vertebra, closed, initial encounter [facility])  Fall, initial encounter   I personally performed the services described in this documentation, which was scribed in my presence. The recorded information has been reviewed and is accurate.    [facility] Orlene Och, NP 11/07/15 1918  Samuel Jester, DO 11/09/15 2352

## 2015-11-15 DIAGNOSIS — M47816 Spondylosis without myelopathy or radiculopathy, lumbar region: Secondary | ICD-10-CM | POA: Diagnosis not present

## 2015-11-15 DIAGNOSIS — R531 Weakness: Secondary | ICD-10-CM | POA: Diagnosis not present

## 2015-11-15 DIAGNOSIS — Z981 Arthrodesis status: Secondary | ICD-10-CM | POA: Diagnosis not present

## 2015-11-15 DIAGNOSIS — R200 Anesthesia of skin: Secondary | ICD-10-CM | POA: Diagnosis not present

## 2015-11-30 ENCOUNTER — Other Ambulatory Visit: Payer: Self-pay | Admitting: "Endocrinology

## 2015-11-30 DIAGNOSIS — E785 Hyperlipidemia, unspecified: Secondary | ICD-10-CM | POA: Diagnosis not present

## 2015-11-30 DIAGNOSIS — E1159 Type 2 diabetes mellitus with other circulatory complications: Secondary | ICD-10-CM | POA: Diagnosis not present

## 2015-12-01 LAB — MICROALBUMIN / CREATININE URINE RATIO
Creatinine, Urine: 114.8 mg/dL
MICROALB/CREAT RATIO: 45.6 mg/g creat — ABNORMAL HIGH (ref 0.0–30.0)
Microalbumin, Urine: 52.4 ug/mL

## 2015-12-01 LAB — T4, FREE: FREE T4: 1.05 ng/dL (ref 0.82–1.77)

## 2015-12-01 LAB — BASIC METABOLIC PANEL
BUN/Creatinine Ratio: 21 — ABNORMAL HIGH (ref 9–20)
BUN: 27 mg/dL — ABNORMAL HIGH (ref 6–24)
CO2: 26 mmol/L (ref 18–29)
Calcium: 9.6 mg/dL (ref 8.7–10.2)
Chloride: 96 mmol/L (ref 96–106)
Creatinine, Ser: 1.27 mg/dL (ref 0.76–1.27)
GFR, EST AFRICAN AMERICAN: 71 mL/min/{1.73_m2} (ref >59)
GFR, EST NON AFRICAN AMERICAN: 61 mL/min/{1.73_m2} (ref >59)
Glucose: 162 mg/dL — ABNORMAL HIGH (ref 65–99)
POTASSIUM: 4.8 mmol/L (ref 3.5–5.2)
Sodium: 142 mmol/L (ref 134–144)

## 2015-12-01 LAB — TSH: TSH: 0.159 u[IU]/mL — ABNORMAL LOW (ref 0.450–4.500)

## 2015-12-01 LAB — HGB A1C W/O EAG: Hgb A1c MFr Bld: 8.8 % — ABNORMAL HIGH (ref 4.8–5.6)

## 2015-12-08 ENCOUNTER — Ambulatory Visit (INDEPENDENT_AMBULATORY_CARE_PROVIDER_SITE_OTHER): Payer: Medicare Other | Admitting: "Endocrinology

## 2015-12-08 ENCOUNTER — Encounter: Payer: Self-pay | Admitting: "Endocrinology

## 2015-12-08 VITALS — BP 113/75 | HR 77 | Ht 69.0 in | Wt 265.0 lb

## 2015-12-08 DIAGNOSIS — E785 Hyperlipidemia, unspecified: Secondary | ICD-10-CM

## 2015-12-08 DIAGNOSIS — E1159 Type 2 diabetes mellitus with other circulatory complications: Secondary | ICD-10-CM

## 2015-12-08 DIAGNOSIS — E669 Obesity, unspecified: Secondary | ICD-10-CM

## 2015-12-08 DIAGNOSIS — E038 Other specified hypothyroidism: Secondary | ICD-10-CM | POA: Diagnosis not present

## 2015-12-08 DIAGNOSIS — I10 Essential (primary) hypertension: Secondary | ICD-10-CM

## 2015-12-08 MED ORDER — SITAGLIPTIN PHOSPHATE 50 MG PO TABS: 50.0000 mg | ORAL_TABLET | Freq: Every day | ORAL | Status: DC

## 2015-12-08 NOTE — Patient Instructions (Signed)

## 2015-12-08 NOTE — Progress Notes (Signed)
Subjective:    Patient ID: [name], male    DOB: 07/09/1956,    Past Medical History  Diagnosis Date  . ASCVD (arteriosclerotic cardiovascular disease)     -MI in 01/2001 prompted CABG; EF-30% at cath; 50% on echo in 7/02 and normal in 2005  . Cerebrovascular disease      L CEA 06/2004 following left renal embolism; 10/2008 plaque w/o focal stenosis  . Hyperlipidemia   . Tobacco abuse, in remission     50 pack years; discontinued in 2002  . Diabetes mellitus     -no insulin  . Chronic obstructive pulmonary disease (HCC)   . Obesity   . Hypothyroidism   . Degenerative joint disease      chronic LBP-s/p L3-4 fusion  . Hypertension   . Restless leg syndrome     Possible   Past Surgical History  Procedure Laterality Date  . Lumbar spine surgery      L3-4 fusion  . Coronary artery bypass graft  2002  . Carotid endarterectomy  2005    Left  . Cataract extraction      bilateral   Social History   Social History  . Marital Status: Divorced    Spouse Name: N/A  . Number of Children: 1  . Years of Education: N/A   Occupational History  . veteran     disabledf   Social History Main Topics  . Smoking status: Former Smoker -- 1.00 packs/day for 50 years    Types: Cigarettes    Quit date: 01/15/2001  . Smokeless tobacco: Never Used  . Alcohol Use: No  . Drug Use: No  . Sexual Activity: Not Asked   Other Topics Concern  . None   Social History Narrative   Outpatient Encounter Prescriptions as of 12/08/2015  Medication Sig  . Insulin Glargine (LANTUS SOLOSTAR [place]) Inject 30 Units into the skin.  Marland Kitchen albuterol-ipratropium (COMBIVENT) 18-103 MCG/ACT inhaler Inhale 2 puffs into the lungs every 6 (six) hours as needed.    Marland Kitchen aspirin 81 MG tablet Take 81 mg by mouth daily.    . B Complex-Biotin-FA (VITAMIN B50 COMPLEX PO) Take by mouth daily.  . Cranberry 400 MG TABS Take by mouth daily.  . diazepam (VALIUM) 10 MG tablet Take 10 mg by mouth every 6 (six) hours as  needed.    . docusate sodium (COLACE) 100 MG capsule Take 100 mg by mouth 2 (two) times daily.    Marland Kitchen escitalopram (LEXAPRO) 20 MG tablet Take 20 mg by mouth daily.    . fenofibrate 160 MG tablet Take 160 mg by mouth daily.    . fish oil-omega-3 fatty acids 1000 MG capsule Take 2 g by mouth daily.  Marland Kitchen HYDROcodone-acetaminophen (NORCO) 10-325 MG per tablet Take 1 tablet by mouth every 6 (six) hours as needed.    Marland Kitchen levothyroxine (SYNTHROID, LEVOTHROID) 200 MCG tablet Take 1 tablet (200 mcg total) by mouth daily.  . Multiple Vitamins-Minerals (MULTIVITAMIN WITH MINERALS) tablet Take 1 tablet by mouth daily.    . nebivolol (BYSTOLIC) 5 MG tablet Take 5 mg by mouth daily. Reported on 09/07/2015  . oxyCODONE-acetaminophen (ROXICET) 5-325 MG tablet Take 1 tablet by mouth every 4 (four) hours as needed for severe pain.  . simvastatin (ZOCOR) 40 MG tablet Take 40 mg by mouth at bedtime.    . sitaGLIPtin (JANUVIA) 50 MG tablet Take 1 tablet (50 mg total) by mouth daily.  . vitamin B-12 (CYANOCOBALAMIN) 100 MCG tablet  Take 100 mcg by mouth daily.  . [DISCONTINUED] Insulin Glargine (LANTUS SOLOSTAR) 100 UNIT/ML Solostar Pen Inject 26 Units into the skin at bedtime.  . [DISCONTINUED] lisinopril (PRINIVIL,ZESTRIL) 40 MG tablet TAKE ONE TABLET BY MOUTH ONCE DAILY. (Patient not taking: Reported on 09/07/2015)  . [DISCONTINUED] sitaGLIPtin (JANUVIA) 50 MG tablet Take 1 tablet (50 mg total) by mouth daily.   No facility-administered encounter medications on file as of 12/08/2015.   ALLERGIES: Allergies  Allergen Reactions  . Alprazolam   . Atorvastatin   . Fenofibrate   . Gemfibrozil   . Ibuprofen   . Invokana [Canagliflozin]    VACCINATION STATUS: Immunization History  Administered Date(s) Administered  . Influenza-Unspecified 05/13/2014    HPI   [name] is a 60- yr- old patient with medical history as above. Patient is here to f/u of his uncontrolled type 2 DM, hypothyroidism, hypertension and  hyperlipidemia . Patient was diagnosed with type 2 DM approximately 8 yrs ago. Patient's recent A1c increased to 8.8% after improving from 9.6%.   -He Remains on  basal insulin   Lantus 26 units daily at bedtime along with Januvia 50 mg daily, came with controlled   fasting blood glucose   Patient reports improvement in his polydipsia, polyuria, and nocturia.   Also has HTN, HPL on treatment and has been overweight to obese most of his adult life. Patient has CAD s/p tripple bypass, CVA, CKD, Neuropathy, and Retinopathy.   his recent renal function test shows near-normal profile.  Pt gives a family hx of type 2 DM in his mother.  Patient is a former smoker, does not participate in a regular exercise program. Patient is willing to engage in intensive monitoring and therapy along with change in life style. Review of Systems  Constitutional: Negative for fever, chills, fatigue and unexpected weight change.  HENT: Negative for dental problem, mouth sores and trouble swallowing.   Eyes: Negative for visual disturbance.  Respiratory: Negative for cough, choking, chest tightness, shortness of breath and wheezing.   Cardiovascular: Negative for chest pain, palpitations and leg swelling.  Gastrointestinal: Negative for nausea, vomiting, abdominal pain, diarrhea, constipation and abdominal distention.  Endocrine: Negative for polydipsia, polyphagia and polyuria.  Genitourinary: Negative for dysuria, urgency, hematuria and flank pain.  Musculoskeletal: Negative for myalgias, back pain, gait problem and neck pain.  Skin: Negative for pallor, rash and wound.  Neurological: Negative for seizures, syncope, weakness, numbness and headaches.  Psychiatric/Behavioral: Negative.  Negative for confusion and dysphoric mood.    Objective:    BP 113/75 mmHg  Pulse 77  Ht  (1.753 m)  Wt 265 lb (120.203 kg)  BMI 39.12 kg/m2  SpO2 98%  Wt Readings from Last 3 Encounters:  12/08/15 265 lb (120.203 kg)   11/07/15 261 lb (118.389 kg)  09/07/15 267 lb (121.11 kg)    Physical Exam  Constitutional: He is oriented to person, place, and time. He appears well-developed and well-nourished. He is cooperative. No distress.  HENT:  Head: Normocephalic and atraumatic.  Eyes: EOM are normal.  Neck: Normal range of motion. Neck supple. No tracheal deviation present. No thyromegaly present.  Cardiovascular: Normal rate, S1 normal, S2 normal and normal heart sounds.  Exam reveals no gallop.   No murmur heard. Pulses:      Dorsalis pedis pulses are 1+ on the right side, and 1+ on the left side.       Posterior tibial pulses are 1+ on the right side, and 1+ on the  left side.  Pulmonary/Chest: Breath sounds normal. No respiratory distress. He has no wheezes.  Abdominal: Soft. Bowel sounds are normal. He exhibits no distension. There is no tenderness. There is no guarding and no CVA tenderness.  Musculoskeletal: He exhibits no edema.       Right shoulder: He exhibits no swelling and no deformity.  Neurological: He is alert and oriented to person, place, and time. He has normal strength and normal reflexes. No cranial nerve deficit or sensory deficit. Gait normal.  Skin: Skin is warm and dry. No rash noted. No cyanosis. Nails show no clubbing.  Psychiatric: He has a normal mood and affect. His speech is normal and behavior is normal. Judgment and thought content normal. Cognition and memory are normal.    Results for orders placed or performed in visit on 11/30/15  Basic metabolic panel  Result Value Ref Range   Glucose 162 (H) 65 - 99 mg/dL   BUN 27 (H) 6 - 24 mg/dL   Creatinine, Ser 1.611.27 0.76 - 1.27 mg/dL   GFR calc non Af Amer 61 >59 mL/min/1.73   GFR calc Af Amer 71 >59 mL/min/1.73   BUN/Creatinine Ratio 21 (H) 9 - 20   Sodium 142 134 - 144 mmol/L   Potassium 4.8 3.5 - 5.2 mmol/L   Chloride 96 96 - 106 mmol/L   CO2 26 18 - 29 mmol/L   Calcium 9.6 8.7 - 10.2 mg/dL  Microalbumin / creatinine  urine ratio  Result Value Ref Range   Creatinine, Urine 114.8 Not Estab. mg/dL   Microalbum.,U,Random 52.4 Not Estab. ug/mL   MICROALB/CREAT RATIO 45.6 (H) 0.0 - 30.0 mg/g creat  Hgb A1c w/o eAG  Result Value Ref Range   Hgb A1c MFr Bld 8.8 (H) 4.8 - 5.6 %  T4, free  Result Value Ref Range   Free T4 1.05 0.82 - 1.77 ng/dL  TSH  Result Value Ref Range   TSH 0.159 (L) 0.450 - 4.500 uIU/mL   Complete Blood Count (Most recent): Lab Results  Component Value Date   WBC 9.1 09/27/2010   HGB 13.2 09/27/2010   HCT 42.7 09/27/2010   MCV 95.1 09/27/2010   PLT 174 09/27/2010    Diabetic Labs (most recent): Lab Results  Component Value Date   HGBA1C 8.8* 11/30/2015   HGBA1C 7.5* 08/31/2015   HGBA1C 7.1* 05/25/2015     Lipid Panel     Component Value Date/Time   CHOL 167 05/25/2015 0807   CHOL 140 09/27/2010   TRIG 230* 05/25/2015 0807   TRIG 329 10/02/2007   HDL 41 05/25/2015 0807   HDL 34 09/27/2010   CHOLHDL 3.4 Ratio 12/26/2009 1848   VLDL 39 12/26/2009 1848   LDLCALC 80 05/25/2015 0807   LDLCALC 62 09/27/2010   LDLCALC 66 10/02/2007       Assessment & Plan:   1. Type 2 diabetes mellitus with stage 3 chronic kidney disease, with long-term current use of insulin (HCC)  His diabetes is  complicated by  CAD, CKD.CVA, Retinopathy. his renal function is improving. Patient came with controlled  fasting  glucose profile,  however his recent A1c has increased to 8.8 percent from  7.5 % , after  improving from 9.6%.  Glucose logs and insulin administration records pertaining to this visit,  to be scanned into patient's records.  Recent labs reviewed.  - Patient remains at a high risk for more acute and chronic complications of diabetes which include CAD, CVA, CKD, retinopathy, and neuropathy.  These are all discussed in detail with the patient.  - I have re-counseled the patient on diet management and weight loss  by adopting a carbohydrate restricted / protein rich  Diet. -  Patient is advised to stick to a routine mealtimes to eat 3 meals  a day and avoid unnecessary snacks ( to snack only to correct hypoglycemia).  - Suggestion is made for patient to avoid simple carbohydrates   from their diet including Cakes , Desserts, Ice Cream,  Soda (  diet and regular) , Sweet Tea , Candies,  Chips, Cookies, Artificial Sweeteners,   and "Sugar-free" Products .  This will help patient to have stable blood glucose profile and potentially avoid unintended  Weight gain.  - The patient  Has been   scheduled with Norm Salt, RDN, CDE for individualized DM education. - I have approached patient with the following individualized plan to manage diabetes and patient agrees.  - I will  increase insulin Lantus 30 units qhs, associated with monitoring of gluco BID. - He will not need prandial insulin based on his readings.   -Patient is encouraged to call clinic for blood glucose levels less than 70 or above 300 mg /dl.  -Due to CKD patient is not a candidate for MTF, SGLT2i. He has history of metformin intolerance.  -I advised him to continue Januvia 50mg  po qday. -He insists that he does not tolerate metformin.  -Patient will be considered for incretin therapy as appropriate next visit. -Target numbers for A1c, LDL, HDL, Triglycerides, Waist Circumference were discussed in detail.   2) BP/HTN: tightly controlled at 90/60, he would like to address medications with his cardiologist.  Continue current medications including ACEI. 3) Lipids/HPL: Recent LDL at target levels 80, continue statins. 4)  Weight/Diet: CDE consult in progress, exercise, and carbohydrates information provided.  5) Hypothyroidism:  is now compliant with his thyroid hormone. I advised him to continue his LT4 200 mcg po qam.   - We discussed about correct intake of levothyroxine, at fasting, with water, separated by at least 30 minutes from breakfast, and separated by more than 4 hours from calcium, iron,  multivitamins, acid reflux medications (PPIs). -Patient is made aware of the fact that thyroid hormone replacement is needed for life, dose to be adjusted by periodic monitoring of thyroid function tests.  6) Chronic Care/Health Maintenance:  -Patient  on ACEI and Statin medications and encouraged to continue to follow up with Ophthalmology, Podiatrist at least yearly or according to recommendations, and advised to  stay away from smoking. I have recommended yearly flu vaccine and pneumonia vaccination at least every 5 years; moderate intensity exercise for up to 150 minutes weekly; and  sleep for at least 7 hours a day.  I advised patient to maintain close follow up with their PCP for primary care needs.  Patient is asked to bring meter and  blood glucose logs during their next visit.   Follow up plan: Return in about 3 months (around 03/08/2016) for diabetes, high blood pressure, high cholesterol, underactive thyroid, follow up with pre-visit labs, meter, and logs.  Marquis Lunch, MD Phone: [phone]  Fax: [phone]   12/08/2015, 8:48 PM

## 2015-12-09 ENCOUNTER — Other Ambulatory Visit: Payer: Self-pay

## 2015-12-09 MED ORDER — SITAGLIPTIN PHOSPHATE 50 MG PO TABS: 50.0000 mg | ORAL_TABLET | Freq: Every day | ORAL | Status: DC

## 2015-12-28 DIAGNOSIS — M47816 Spondylosis without myelopathy or radiculopathy, lumbar region: Secondary | ICD-10-CM | POA: Diagnosis not present

## 2016-01-10 DIAGNOSIS — Z1389 Encounter for screening for other disorder: Secondary | ICD-10-CM | POA: Diagnosis not present

## 2016-01-10 DIAGNOSIS — R300 Dysuria: Secondary | ICD-10-CM | POA: Diagnosis not present

## 2016-02-17 ENCOUNTER — Other Ambulatory Visit: Payer: Self-pay | Admitting: "Endocrinology

## 2016-02-29 ENCOUNTER — Other Ambulatory Visit: Payer: Self-pay | Admitting: "Endocrinology

## 2016-02-29 DIAGNOSIS — E1159 Type 2 diabetes mellitus with other circulatory complications: Secondary | ICD-10-CM | POA: Diagnosis not present

## 2016-03-01 LAB — COMPREHENSIVE METABOLIC PANEL
ALT: 35 IU/L (ref 0–44)
AST: 20 IU/L (ref 0–40)
Albumin/Globulin Ratio: 1.8 (ref 1.2–2.2)
Albumin: 4.5 g/dL (ref 3.6–4.8)
Alkaline Phosphatase: 33 IU/L — ABNORMAL LOW (ref 39–117)
BUN/Creatinine Ratio: 16 (ref 10–24)
BUN: 20 mg/dL (ref 8–27)
Bilirubin Total: 0.4 mg/dL (ref 0.0–1.2)
CALCIUM: 9.6 mg/dL (ref 8.6–10.2)
CO2: 28 mmol/L (ref 18–29)
Chloride: 95 mmol/L — ABNORMAL LOW (ref 96–106)
Creatinine, Ser: 1.23 mg/dL (ref 0.76–1.27)
GFR calc Af Amer: 73 mL/min/{1.73_m2} (ref >59)
GFR, EST NON AFRICAN AMERICAN: 63 mL/min/{1.73_m2} (ref >59)
GLUCOSE: 184 mg/dL — AB (ref 65–99)
Globulin, Total: 2.5 g/dL (ref 1.5–4.5)
Potassium: 5.3 mmol/L — ABNORMAL HIGH (ref 3.5–5.2)
Sodium: 138 mmol/L (ref 134–144)
TOTAL PROTEIN: 7 g/dL (ref 6.0–8.5)

## 2016-03-01 LAB — HGB A1C W/O EAG: Hgb A1c MFr Bld: 8.1 % — ABNORMAL HIGH (ref 4.8–5.6)

## 2016-03-05 ENCOUNTER — Encounter: Payer: Self-pay | Admitting: "Endocrinology

## 2016-03-05 ENCOUNTER — Ambulatory Visit (INDEPENDENT_AMBULATORY_CARE_PROVIDER_SITE_OTHER): Payer: Medicare Other | Admitting: "Endocrinology

## 2016-03-05 VITALS — BP 122/74 | HR 90 | Resp 18 | Ht 69.0 in | Wt 272.0 lb

## 2016-03-05 DIAGNOSIS — E785 Hyperlipidemia, unspecified: Secondary | ICD-10-CM | POA: Diagnosis not present

## 2016-03-05 DIAGNOSIS — I10 Essential (primary) hypertension: Secondary | ICD-10-CM

## 2016-03-05 DIAGNOSIS — E038 Other specified hypothyroidism: Secondary | ICD-10-CM

## 2016-03-05 DIAGNOSIS — E1159 Type 2 diabetes mellitus with other circulatory complications: Secondary | ICD-10-CM | POA: Diagnosis not present

## 2016-03-05 MED ORDER — SITAGLIPTIN PHOSPHATE 50 MG PO TABS: 50.0000 mg | ORAL_TABLET | Freq: Every day | ORAL | 1 refills | Status: DC

## 2016-03-05 NOTE — Progress Notes (Signed)
Subjective:    Patient ID: [name], male    DOB: 1955-10-11,    Past Medical History:  Diagnosis Date  . ASCVD (arteriosclerotic cardiovascular disease)    -MI in 01/2001 prompted CABG; EF-30% at cath; 50% on echo in 7/02 and normal in 2005  . Cerebrovascular disease     L CEA 06/2004 following left renal embolism; 10/2008 plaque w/o focal stenosis  . Chronic obstructive pulmonary disease (HCC)   . Degenerative joint disease     chronic LBP-s/p L3-4 fusion  . Diabetes mellitus    -no insulin  . Hyperlipidemia   . Hypertension   . Hypothyroidism   . Obesity   . Restless leg syndrome    Possible  . Tobacco abuse, in remission    50 pack years; discontinued in 2002   Past Surgical History:  Procedure Laterality Date  . CAROTID ENDARTERECTOMY  2005   Left  . CATARACT EXTRACTION     bilateral  . CORONARY ARTERY BYPASS GRAFT  2002  . LUMBAR SPINE SURGERY     L3-4 fusion   Social History   Social History  . Marital status: Divorced    Spouse name: N/A  . Number of children: 1  . Years of education: N/A   Occupational History  . veteran     disabledf   Social History Main Topics  . Smoking status: Former Smoker    Packs/day: 1.00    Years: 50.00    Types: Cigarettes    Quit date: 01/15/2001  . Smokeless tobacco: Never Used  . Alcohol use No  . Drug use: No  . Sexual activity: Not Asked   Other Topics Concern  . None   Social History Narrative  . None   Outpatient Encounter Prescriptions as of 03/05/2016  Medication Sig  . albuterol-ipratropium (COMBIVENT) 18-103 MCG/ACT inhaler Inhale 2 puffs into the lungs every 6 (six) hours as needed.    Marland Kitchen aspirin 81 MG tablet Take 81 mg by mouth daily.    . B Complex-Biotin-FA (VITAMIN B50 COMPLEX PO) Take by mouth daily.  . Cranberry 400 MG TABS Take by mouth daily.  . diazepam (VALIUM) 10 MG tablet Take 10 mg by mouth every 6 (six) hours as needed.    . docusate sodium (COLACE) 100 MG capsule Take 100 mg  by mouth 2 (two) times daily.    Marland Kitchen escitalopram (LEXAPRO) 20 MG tablet Take 20 mg by mouth daily.    . fenofibrate 160 MG tablet Take 160 mg by mouth daily.    . fish oil-omega-3 fatty acids 1000 MG capsule Take 2 g by mouth daily.  Marland Kitchen HYDROcodone-acetaminophen (NORCO) 10-325 MG per tablet Take 1 tablet by mouth every 6 (six) hours as needed.    . Insulin Glargine (LANTUS SOLOSTAR Schley) Inject 36 Units into the skin at bedtime.  Marland Kitchen levothyroxine (SYNTHROID, LEVOTHROID) 200 MCG tablet Take 1 tablet (200 mcg total) by mouth daily.  . Multiple Vitamins-Minerals (MULTIVITAMIN WITH MINERALS) tablet Take 1 tablet by mouth daily.    . nebivolol (BYSTOLIC) 5 MG tablet Take 5 mg by mouth daily. Reported on 09/07/2015  . oxyCODONE-acetaminophen (ROXICET) 5-325 MG tablet Take 1 tablet by mouth every 4 (four) hours as needed for severe pain.  . simvastatin (ZOCOR) 40 MG tablet Take 40 mg by mouth at bedtime.    . sitaGLIPtin (JANUVIA) 50 MG tablet Take 1 tablet (50 mg total) by mouth daily.  . vitamin B-12 (CYANOCOBALAMIN) 100 MCG tablet  Take 100 mcg by mouth daily.  . [DISCONTINUED] Insulin Glargine (LANTUS SOLOSTAR) 100 UNIT/ML Solostar Pen Inject 30 Units into the skin at bedtime.  . [DISCONTINUED] sitaGLIPtin (JANUVIA) 50 MG tablet Take 1 tablet (50 mg total) by mouth daily.   No facility-administered encounter medications on file as of 03/05/2016.    ALLERGIES: Allergies  Allergen Reactions  . Alprazolam   . Atorvastatin   . Fenofibrate   . Gemfibrozil   . Ibuprofen   . Invokana [Canagliflozin]    VACCINATION STATUS: Immunization History  Administered Date(s) Administered  . Influenza-Unspecified 05/13/2014    Diabetes  He presents for his follow-up diabetic visit. He has type 2 diabetes mellitus. Onset time: He was diagnosed at approximate age of 50 years. His disease course has been improving. There are no hypoglycemic associated symptoms. Pertinent negatives for hypoglycemia include no  confusion, headaches, pallor or seizures. There are no diabetic associated symptoms. Pertinent negatives for diabetes include no chest pain, no fatigue, no polydipsia, no polyphagia, no polyuria and no weakness. There are no hypoglycemic complications. Symptoms are improving. Diabetic complications include a CVA, heart disease and nephropathy. Risk factors for coronary artery disease include diabetes mellitus, dyslipidemia, hypertension, male sex and tobacco exposure. Current diabetic treatment includes insulin injections and oral agent (monotherapy). He is compliant with treatment most of the time. His weight is stable. He is following a generally unhealthy diet. When asked about meal planning, he reported none. He has had a previous visit with a dietitian. He never participates in exercise. His breakfast blood glucose range is generally 180-200 mg/dl. His dinner blood glucose range is generally 180-200 mg/dl. His overall blood glucose range is 180-200 mg/dl. An ACE inhibitor/angiotensin II receptor blocker is being taken. Eye exam is current.  Hyperlipidemia  This is a chronic problem. The current episode started more than 1 year ago. Exacerbating diseases include diabetes and obesity. Pertinent negatives include no chest pain, myalgias or shortness of breath. Current antihyperlipidemic treatment includes statins. Risk factors for coronary artery disease include family history, dyslipidemia, diabetes mellitus, hypertension, male sex and a sedentary lifestyle.  Hypertension  This is a chronic problem. The current episode started more than 1 year ago. Pertinent negatives include no chest pain, headaches, neck pain, palpitations or shortness of breath. Risk factors for coronary artery disease include diabetes mellitus, obesity, sedentary lifestyle and smoking/tobacco exposure. Past treatments include ACE inhibitors. Hypertensive end-organ damage includes CAD/MI and CVA.      Review of Systems   Constitutional: Negative for chills, fatigue, fever and unexpected weight change.  HENT: Negative for dental problem, mouth sores and trouble swallowing.   Eyes: Negative for visual disturbance.  Respiratory: Negative for cough, choking, chest tightness, shortness of breath and wheezing.   Cardiovascular: Negative for chest pain, palpitations and leg swelling.  Gastrointestinal: Negative for abdominal distention, abdominal pain, constipation, diarrhea, nausea and vomiting.  Endocrine: Negative for polydipsia, polyphagia and polyuria.  Genitourinary: Negative for dysuria, flank pain, hematuria and urgency.  Musculoskeletal: Negative for back pain, gait problem, myalgias and neck pain.  Skin: Negative for pallor, rash and wound.  Neurological: Negative for seizures, syncope, weakness, numbness and headaches.  Psychiatric/Behavioral: Negative.  Negative for confusion and dysphoric mood.    Objective:    BP 122/74 (BP Location: Right Arm, Patient Position: Sitting, Cuff Size: Large)   Pulse 90   Resp 18   Ht  (1.753 m)   Wt 272 lb (123.4 kg)   SpO2 96%   BMI 40.17  kg/m   Wt Readings from Last 3 Encounters:  03/05/16 272 lb (123.4 kg)  12/08/15 265 lb (120.2 kg)  11/07/15 261 lb (118.4 kg)    Physical Exam  Constitutional: He is oriented to person, place, and time. He appears well-developed and well-nourished. He is cooperative. No distress.  HENT:  Head: Normocephalic and atraumatic.  Eyes: EOM are normal.  Neck: Normal range of motion. Neck supple. No tracheal deviation present. No thyromegaly present.  Cardiovascular: Normal rate, S1 normal, S2 normal and normal heart sounds.  Exam reveals no gallop.   No murmur heard. Pulses:      Dorsalis pedis pulses are 1+ on the right side, and 1+ on the left side.       Posterior tibial pulses are 1+ on the right side, and 1+ on the left side.  Pulmonary/Chest: Breath sounds normal. No respiratory distress. He has no wheezes.   Abdominal: Soft. Bowel sounds are normal. He exhibits no distension. There is no tenderness. There is no guarding and no CVA tenderness.  Musculoskeletal: He exhibits no edema.       Right shoulder: He exhibits no swelling and no deformity.  Neurological: He is alert and oriented to person, place, and time. He has normal strength and normal reflexes. No cranial nerve deficit or sensory deficit. Gait normal.  Skin: Skin is warm and dry. No rash noted. No cyanosis. Nails show no clubbing.  Psychiatric: He has a normal mood and affect. His speech is normal and behavior is normal. Judgment and thought content normal. Cognition and memory are normal.    Results for orders placed or performed in visit on 11/30/15  Basic metabolic panel  Result Value Ref Range   Glucose 162 (H) 65 - 99 mg/dL   BUN 27 (H) 6 - 24 mg/dL   Creatinine, Ser 5.36 0.76 - 1.27 mg/dL   GFR calc non Af Amer 61 >59 mL/min/1.73   GFR calc Af Amer 71 >59 mL/min/1.73   BUN/Creatinine Ratio 21 (H) 9 - 20   Sodium 142 134 - 144 mmol/L   Potassium 4.8 3.5 - 5.2 mmol/L   Chloride 96 96 - 106 mmol/L   CO2 26 18 - 29 mmol/L   Calcium 9.6 8.7 - 10.2 mg/dL  Microalbumin / creatinine urine ratio  Result Value Ref Range   Creatinine, Urine 114.8 Not Estab. mg/dL   Microalbum.,U,Random 52.4 Not Estab. ug/mL   MICROALB/CREAT RATIO 45.6 (H) 0.0 - 30.0 mg/g creat  Hgb A1c w/o eAG  Result Value Ref Range   Hgb A1c MFr Bld 8.8 (H) 4.8 - 5.6 %  T4, free  Result Value Ref Range   Free T4 1.05 0.82 - 1.77 ng/dL  TSH  Result Value Ref Range   TSH 0.159 (L) 0.450 - 4.500 uIU/mL   Complete Blood Count (Most recent): Lab Results  Component Value Date   WBC 9.1 09/27/2010   HGB 13.2 09/27/2010   HCT 42.7 09/27/2010   MCV 95.1 09/27/2010   PLT 174 09/27/2010    Diabetic Labs (most recent): Lab Results  Component Value Date   HGBA1C 8.8 (H) 11/30/2015   HGBA1C 7.5 (H) 08/31/2015   HGBA1C 7.1 (H) 05/25/2015     Lipid Panel      Component Value Date/Time   CHOL 167 05/25/2015 0807   TRIG 230 (H) 05/25/2015 0807   TRIG 329 10/02/2007   HDL 41 05/25/2015 0807   CHOLHDL 3.4 Ratio 12/26/2009 1848   VLDL 39 12/26/2009 1848  LDLCALC 80 05/25/2015 0807   LDLCALC 66 10/02/2007       Assessment & Plan:   1. Type 2 diabetes mellitus with stage 3 chronic kidney disease, with long-term current use of insulin (HCC)  His diabetes is  complicated by  CAD, CKD.CVA, Retinopathy. his renal function is improving. Patient came with controlled  fasting  glucose profile,  And his A1c is improving to 8.1% from 8.8%, generally improving from 9.6%.    Glucose logs and insulin administration records pertaining to this visit,  to be scanned into patient's records.  Recent labs reviewed.  - Patient remains at a high risk for more acute and chronic complications of diabetes which include CAD, CVA, CKD, retinopathy, and neuropathy. These are all discussed in detail with the patient.  - I have re-counseled the patient on diet management and weight loss  by adopting a carbohydrate restricted / protein rich  Diet. - Patient is advised to stick to a routine mealtimes to eat 3 meals  a day and avoid unnecessary snacks ( to snack only to correct hypoglycemia).  - Suggestion is made for patient to avoid simple carbohydrates   from their diet including Cakes , Desserts, Ice Cream,  Soda (  diet and regular) , Sweet Tea , Candies,  Chips, Cookies, Artificial Sweeteners,   and "Sugar-free" Products .  This will help patient to have stable blood glucose profile and potentially avoid unintended  Weight gain.  - The patient  Has been   scheduled with Norm Salt, RDN, CDE for individualized DM education. - I have approached patient with the following individualized plan to manage diabetes and patient agrees.  - I will  increase insulin Lantus 36 units qhs, associated with monitoring of gluco BID. - He will not need prandial insulin based on  his readings.   -Patient is encouraged to call clinic for blood glucose levels less than 70 or above 300 mg /dl.  -Due to CKD patient is not a candidate for MTF, SGLT2i. He has history of metformin intolerance.  -I advised him to continue Januvia 50mg  po qday. -He insists that he does not tolerate metformin.  -Patient will be considered for incretin therapy as appropriate next visit. -Target numbers for A1c, LDL, HDL, Triglycerides, Waist Circumference were discussed in detail.   2) BP/HTN: tightly controlled at 90/60, he would like to address medications with his cardiologist.  Continue current medications including ACEI. 3) Lipids/HPL: Recent LDL at target levels 80, continue statins. 4)  Weight/Diet: CDE consult in progress, exercise, and carbohydrates information provided.  5) Hypothyroidism:  is now compliant with his thyroid hormone. I advised him to continue his LT4 200 mcg po qam.   - We discussed about correct intake of levothyroxine, at fasting, with water, separated by at least 30 minutes from breakfast, and separated by more than 4 hours from calcium, iron, multivitamins, acid reflux medications (PPIs). -Patient is made aware of the fact that thyroid hormone replacement is needed for life, dose to be adjusted by periodic monitoring of thyroid function tests.  6) Chronic Care/Health Maintenance:  -Patient  on ACEI and Statin medications and encouraged to continue to follow up with Ophthalmology, Podiatrist at least yearly or according to recommendations, and advised to  stay away from smoking. I have recommended yearly flu vaccine and pneumonia vaccination at least every 5 years; moderate intensity exercise for up to 150 minutes weekly; and  sleep for at least 7 hours a day.  I advised patient to  maintain close follow up with their PCP for primary care needs.  Patient is asked to bring meter and  blood glucose logs during their next visit.   Follow up plan: Return in about 3  months (around 06/05/2016) for follow up with pre-visit labs, meter, and logs.  Marquis Lunch, MD Phone: [phone]  Fax: [phone]   03/05/2016, 1:30 PM

## 2016-03-05 NOTE — Patient Instructions (Signed)

## 2016-03-12 ENCOUNTER — Encounter: Payer: Self-pay | Admitting: "Endocrinology

## 2016-04-03 ENCOUNTER — Other Ambulatory Visit: Payer: Self-pay | Admitting: "Endocrinology

## 2016-04-03 DIAGNOSIS — M47816 Spondylosis without myelopathy or radiculopathy, lumbar region: Secondary | ICD-10-CM | POA: Diagnosis not present

## 2016-04-03 DIAGNOSIS — M1990 Unspecified osteoarthritis, unspecified site: Secondary | ICD-10-CM | POA: Diagnosis not present

## 2016-05-10 ENCOUNTER — Other Ambulatory Visit: Payer: Self-pay | Admitting: "Endocrinology

## 2016-05-14 DIAGNOSIS — Z1389 Encounter for screening for other disorder: Secondary | ICD-10-CM | POA: Diagnosis not present

## 2016-05-14 DIAGNOSIS — G8929 Other chronic pain: Secondary | ICD-10-CM | POA: Diagnosis not present

## 2016-05-14 DIAGNOSIS — J449 Chronic obstructive pulmonary disease, unspecified: Secondary | ICD-10-CM | POA: Diagnosis not present

## 2016-06-04 ENCOUNTER — Telehealth: Payer: Self-pay | Admitting: "Endocrinology

## 2016-06-04 NOTE — Telephone Encounter (Signed)
After reviewing the BG readings Dr Fransico HimNida advised to Increase Lantus to 42 units qhs. Pt notified and agrees

## 2016-06-04 NOTE — Telephone Encounter (Signed)
Pt brought BG readings by the office.

## 2016-06-04 NOTE — Telephone Encounter (Signed)
Patient calling with blood sugar reading over 250. Please call Monday afternoon, as he will be at the [facility] all morning.

## 2016-06-04 NOTE — Telephone Encounter (Signed)
Left message for pt to call us back with BG readings.

## 2016-06-11 DIAGNOSIS — E1159 Type 2 diabetes mellitus with other circulatory complications: Secondary | ICD-10-CM | POA: Diagnosis not present

## 2016-06-11 DIAGNOSIS — E038 Other specified hypothyroidism: Secondary | ICD-10-CM | POA: Diagnosis not present

## 2016-06-12 LAB — CMP14+EGFR
ALK PHOS: 48 IU/L (ref 39–117)
ALT: 79 IU/L — ABNORMAL HIGH (ref 0–44)
AST: 58 IU/L — ABNORMAL HIGH (ref 0–40)
Albumin/Globulin Ratio: 1.7 (ref 1.2–2.2)
Albumin: 4.7 g/dL (ref 3.6–4.8)
BILIRUBIN TOTAL: 0.3 mg/dL (ref 0.0–1.2)
BUN/Creatinine Ratio: 17 (ref 10–24)
BUN: 22 mg/dL (ref 8–27)
CO2: 21 mmol/L (ref 18–29)
Calcium: 10.1 mg/dL (ref 8.6–10.2)
Chloride: 96 mmol/L (ref 96–106)
Creatinine, Ser: 1.32 mg/dL — ABNORMAL HIGH (ref 0.76–1.27)
GFR calc Af Amer: 67 mL/min/{1.73_m2} (ref >59)
GFR calc non Af Amer: 58 mL/min/{1.73_m2} — ABNORMAL LOW (ref >59)
GLOBULIN, TOTAL: 2.7 g/dL (ref 1.5–4.5)
Glucose: 228 mg/dL — ABNORMAL HIGH (ref 65–99)
POTASSIUM: 3.7 mmol/L (ref 3.5–5.2)
SODIUM: 136 mmol/L (ref 134–144)
Total Protein: 7.4 g/dL (ref 6.0–8.5)

## 2016-06-12 LAB — HEMOGLOBIN A1C
Est. average glucose Bld gHb Est-mCnc: 252 mg/dL
Hgb A1c MFr Bld: 10.4 % — ABNORMAL HIGH (ref 4.8–5.6)

## 2016-06-12 LAB — T4, FREE: Free T4: 1.56 ng/dL (ref 0.82–1.77)

## 2016-06-12 LAB — TSH: TSH: 1.02 u[IU]/mL (ref 0.450–4.500)

## 2016-06-13 ENCOUNTER — Encounter: Payer: Self-pay | Admitting: "Endocrinology

## 2016-06-13 ENCOUNTER — Ambulatory Visit (INDEPENDENT_AMBULATORY_CARE_PROVIDER_SITE_OTHER): Payer: Medicare Other | Admitting: "Endocrinology

## 2016-06-13 VITALS — BP 134/78 | HR 92 | Resp 18 | Ht 69.0 in | Wt 274.0 lb

## 2016-06-13 DIAGNOSIS — E038 Other specified hypothyroidism: Secondary | ICD-10-CM

## 2016-06-13 DIAGNOSIS — E782 Mixed hyperlipidemia: Secondary | ICD-10-CM

## 2016-06-13 DIAGNOSIS — F17201 Nicotine dependence, unspecified, in remission: Secondary | ICD-10-CM | POA: Diagnosis not present

## 2016-06-13 DIAGNOSIS — Z794 Long term (current) use of insulin: Secondary | ICD-10-CM | POA: Diagnosis not present

## 2016-06-13 DIAGNOSIS — I10 Essential (primary) hypertension: Secondary | ICD-10-CM | POA: Diagnosis not present

## 2016-06-13 DIAGNOSIS — E1122 Type 2 diabetes mellitus with diabetic chronic kidney disease: Secondary | ICD-10-CM | POA: Diagnosis not present

## 2016-06-13 DIAGNOSIS — N183 Chronic kidney disease, stage 3 unspecified: Secondary | ICD-10-CM

## 2016-06-13 MED ORDER — INSULIN GLARGINE 100 UNIT/ML SOLOSTAR PEN: 36.0000 [IU] | PEN_INJECTOR | Freq: Every day | SUBCUTANEOUS | 11 refills | Status: DC

## 2016-06-13 MED ORDER — INSULIN GLARGINE 100 UNIT/ML SOLOSTAR PEN: 50.0000 [IU] | PEN_INJECTOR | Freq: Every day | SUBCUTANEOUS | 2 refills | Status: DC

## 2016-06-13 MED ORDER — SITAGLIPTIN PHOSPHATE 50 MG PO TABS: 50.0000 mg | ORAL_TABLET | Freq: Every day | ORAL | 0 refills | Status: DC

## 2016-06-13 NOTE — Progress Notes (Signed)
Subjective:    Patient ID: [name], male    DOB: 1956-06-28,    Past Medical History:  Diagnosis Date  . ASCVD (arteriosclerotic cardiovascular disease)    -MI in 01/2001 prompted CABG; EF-30% at cath; 50% on echo in 7/02 and normal in 2005  . Cerebrovascular disease     L CEA 06/2004 following left renal embolism; 10/2008 plaque w/o focal stenosis  . Chronic obstructive pulmonary disease ([place])   . Degenerative joint disease     chronic LBP-s/p L3-4 fusion  . Diabetes mellitus    -no insulin  . Hyperlipidemia   . Hypertension   . Hypothyroidism   . Obesity   . Restless leg syndrome    Possible  . Tobacco abuse, in remission    50 pack years; discontinued in 2002   Past Surgical History:  Procedure Laterality Date  . CAROTID ENDARTERECTOMY  2005   Left  . CATARACT EXTRACTION     bilateral  . CORONARY ARTERY BYPASS GRAFT  2002  . LUMBAR SPINE SURGERY     L3-4 fusion   Social History   Social History  . Marital status: Divorced    Spouse name: N/A  . Number of children: 1  . Years of education: N/A   Occupational History  . veteran     disabledf   Social History Main Topics  . Smoking status: Former Smoker    Packs/day: 1.00    Years: 50.00    Types: Cigarettes    Quit date: 01/15/2001  . Smokeless tobacco: Never Used  . Alcohol use No  . Drug use: No  . Sexual activity: Not Asked   Other Topics Concern  . None   Social History Narrative  . None   Outpatient Encounter Prescriptions as of 06/13/2016  Medication Sig  . albuterol-ipratropium (COMBIVENT) 18-103 MCG/ACT inhaler Inhale 2 puffs into the lungs every 6 (six) hours as needed.    Marland Kitchen aspirin 81 MG tablet Take 81 mg by mouth daily.    . B Complex-Biotin-FA (VITAMIN B50 COMPLEX PO) Take by mouth daily.  . Cranberry 400 MG TABS Take by mouth daily.  . diazepam (VALIUM) 10 MG tablet Take 10 mg by mouth every 6 (six) hours as needed.    . docusate sodium (COLACE) 100 MG capsule Take 100 mg  by mouth 2 (two) times daily.    Marland Kitchen escitalopram (LEXAPRO) 20 MG tablet Take 20 mg by mouth daily.    . fenofibrate 160 MG tablet Take 160 mg by mouth daily.    . fish oil-omega-3 fatty acids 1000 MG capsule Take 2 g by mouth daily.  Marland Kitchen HYDROcodone-acetaminophen (NORCO) 10-325 MG per tablet Take 1 tablet by mouth every 6 (six) hours as needed.    . Insulin Glargine (LANTUS SOLOSTAR) 100 UNIT/ML Solostar Pen Inject 50 Units into the skin daily at 10 pm.  . levothyroxine (SYNTHROID, LEVOTHROID) 200 MCG tablet Take 1 tablet (200 mcg total) by mouth daily.  . Multiple Vitamins-Minerals (MULTIVITAMIN WITH MINERALS) tablet Take 1 tablet by mouth daily.    . nebivolol (BYSTOLIC) 5 MG tablet Take 5 mg by mouth daily. Reported on 09/07/2015  . oxyCODONE-acetaminophen (ROXICET) 5-325 MG tablet Take 1 tablet by mouth every 4 (four) hours as needed for severe pain.  . simvastatin (ZOCOR) 40 MG tablet Take 40 mg by mouth at bedtime.    . sitaGLIPtin (JANUVIA) 50 MG tablet Take 1 tablet (50 mg total) by mouth daily.  Haig Prophet  COMFORT PEN NEEDLES 31G X 8 MM MISC USE AS DIRECTED WITH LANTUS.  Marland Kitchen vitamin B-12 (CYANOCOBALAMIN) 100 MCG tablet Take 100 mcg by mouth daily.  . [DISCONTINUED] Insulin Glargine (LANTUS SOLOSTAR [place]) Inject 36 Units into the skin at bedtime.  . [DISCONTINUED] Insulin Glargine (LANTUS SOLOSTAR) 100 UNIT/ML Solostar Pen Inject 36 Units into the skin at bedtime.  . [DISCONTINUED] JANUVIA 50 MG tablet TAKE 1 TABLET BY MOUTH DAILY.  . [DISCONTINUED] LANTUS SOLOSTAR 100 UNIT/ML Solostar Pen INJECT 30 UNITS SUBCUTANEOUSLY AT BEDTIME.   No facility-administered encounter medications on file as of 06/13/2016.    ALLERGIES: Allergies  Allergen Reactions  . Alprazolam   . Atorvastatin   . Fenofibrate   . Gemfibrozil   . Ibuprofen   . Invokana [Canagliflozin]    VACCINATION STATUS: Immunization History  Administered Date(s) Administered  . Influenza-Unspecified 05/13/2014    Diabetes  He  presents for his follow-up diabetic visit. He has type 2 diabetes mellitus. Onset time: He was diagnosed at approximate age of 59 years. His disease course has been worsening. There are no hypoglycemic associated symptoms. Pertinent negatives for hypoglycemia include no confusion, headaches, pallor or seizures. There are no diabetic associated symptoms. Pertinent negatives for diabetes include no chest pain, no fatigue, no polydipsia, no polyphagia, no polyuria and no weakness. There are no hypoglycemic complications. Symptoms are worsening. Diabetic complications include a CVA, heart disease and nephropathy. Risk factors for coronary artery disease include diabetes mellitus, dyslipidemia, hypertension, male sex and tobacco exposure. Current diabetic treatment includes insulin injections and oral agent (monotherapy). He is compliant with treatment most of the time. His weight is stable. He is following a generally unhealthy diet. When asked about meal planning, he reported none. He has had a previous visit with a dietitian. He never participates in exercise. His breakfast blood glucose range is generally >200 mg/dl. His overall blood glucose range is >200 mg/dl. An ACE inhibitor/angiotensin II receptor blocker is being taken. Eye exam is current.  Hyperlipidemia  This is a chronic problem. The current episode started more than 1 year ago. Exacerbating diseases include diabetes and obesity. Pertinent negatives include no chest pain, myalgias or shortness of breath. Current antihyperlipidemic treatment includes statins. Risk factors for coronary artery disease include family history, dyslipidemia, diabetes mellitus, hypertension, male sex and a sedentary lifestyle.  Hypertension  This is a chronic problem. The current episode started more than 1 year ago. Pertinent negatives include no chest pain, headaches, neck pain, palpitations or shortness of breath. Risk factors for coronary artery disease include diabetes  mellitus, obesity, sedentary lifestyle and smoking/tobacco exposure. Past treatments include ACE inhibitors. Hypertensive end-organ damage includes CAD/MI and CVA.      Review of Systems  Constitutional: Negative for chills, fatigue, fever and unexpected weight change.  HENT: Negative for dental problem, mouth sores and trouble swallowing.   Eyes: Negative for visual disturbance.  Respiratory: Negative for cough, choking, chest tightness, shortness of breath and wheezing.   Cardiovascular: Negative for chest pain, palpitations and leg swelling.  Gastrointestinal: Negative for abdominal distention, abdominal pain, constipation, diarrhea, nausea and vomiting.  Endocrine: Negative for polydipsia, polyphagia and polyuria.  Genitourinary: Negative for dysuria, flank pain, hematuria and urgency.  Musculoskeletal: Negative for back pain, gait problem, myalgias and neck pain.  Skin: Negative for pallor, rash and wound.  Neurological: Negative for seizures, syncope, weakness, numbness and headaches.  Psychiatric/Behavioral: Negative.  Negative for confusion and dysphoric mood.    Objective:    BP 134/78  Pulse 92   Resp 18   Ht '5\' 9"'  (1.753 m)   Wt 274 lb 0.6 oz (124.3 kg)   SpO2 95%   BMI 40.47 kg/m   Wt Readings from Last 3 Encounters:  06/13/16 274 lb 0.6 oz (124.3 kg)  03/05/16 272 lb (123.4 kg)  12/08/15 265 lb (120.2 kg)    Physical Exam  Constitutional: He is oriented to person, place, and time. He appears well-developed and well-nourished. He is cooperative. No distress.  HENT:  Head: Normocephalic and atraumatic.  Eyes: EOM are normal.  Neck: Normal range of motion. Neck supple. No tracheal deviation present. No thyromegaly present.  Cardiovascular: Normal rate, S1 normal, S2 normal and normal heart sounds.  Exam reveals no gallop.   No murmur heard. Pulses:      Dorsalis pedis pulses are 1+ on the right side, and 1+ on the left side.       Posterior tibial pulses are 1+  on the right side, and 1+ on the left side.  Pulmonary/Chest: Breath sounds normal. No respiratory distress. He has no wheezes.  Abdominal: Soft. Bowel sounds are normal. He exhibits no distension. There is no tenderness. There is no guarding and no CVA tenderness.  Musculoskeletal: He exhibits no edema.       Right shoulder: He exhibits no swelling and no deformity.  Neurological: He is alert and oriented to person, place, and time. He has normal strength and normal reflexes. No cranial nerve deficit or sensory deficit. Gait normal.  Skin: Skin is warm and dry. No rash noted. No cyanosis. Nails show no clubbing.  Psychiatric: He has a normal mood and affect. His speech is normal and behavior is normal. Judgment and thought content normal. Cognition and memory are normal.    Results for orders placed or performed in visit on 03/05/16  CMP14+EGFR  Result Value Ref Range   Glucose 228 (H) 65 - 99 mg/dL   BUN 22 8 - 27 mg/dL   Creatinine, Ser 1.32 (H) 0.76 - 1.27 mg/dL   GFR calc non Af Amer 58 (L) >59 mL/min/1.73   GFR calc Af Amer 67 >59 mL/min/1.73   BUN/Creatinine Ratio 17 10 - 24   Sodium 136 134 - 144 mmol/L   Potassium 3.7 3.5 - 5.2 mmol/L   Chloride 96 96 - 106 mmol/L   CO2 21 18 - 29 mmol/L   Calcium 10.1 8.6 - 10.2 mg/dL   Total Protein 7.4 6.0 - 8.5 g/dL   Albumin 4.7 3.6 - 4.8 g/dL   Globulin, Total 2.7 1.5 - 4.5 g/dL   Albumin/Globulin Ratio 1.7 1.2 - 2.2   Bilirubin Total 0.3 0.0 - 1.2 mg/dL   Alkaline Phosphatase 48 39 - 117 IU/L   AST 58 (H) 0 - 40 IU/L   ALT 79 (H) 0 - 44 IU/L  Hemoglobin A1c  Result Value Ref Range   Hgb A1c MFr Bld 10.4 (H) 4.8 - 5.6 %   Est. average glucose Bld gHb Est-mCnc 252 mg/dL  TSH  Result Value Ref Range   TSH 1.020 0.450 - 4.500 uIU/mL  T4, free  Result Value Ref Range   Free T4 1.56 0.82 - 1.77 ng/dL   Complete Blood Count (Most recent): Lab Results  Component Value Date   WBC 9.1 09/27/2010   HGB 13.2 09/27/2010   HCT 42.7  09/27/2010   MCV 95.1 09/27/2010   PLT 174 09/27/2010    Diabetic Labs (most recent): Lab Results  Component  Value Date   HGBA1C 10.4 (H) 06/11/2016   HGBA1C 8.1 (H) 02/29/2016   HGBA1C 8.8 (H) 11/30/2015     Lipid Panel     Component Value Date/Time   CHOL 167 05/25/2015 0807   TRIG 230 (H) 05/25/2015 0807   TRIG 329 10/02/2007   HDL 41 05/25/2015 0807   CHOLHDL 3.4 Ratio 12/26/2009 1848   VLDL 39 12/26/2009 1848   LDLCALC 80 05/25/2015 0807   LDLCALC 66 10/02/2007       Assessment & Plan:   1. Type 2 diabetes mellitus with stage 3 chronic kidney disease, with long-term current use of insulin (HCC)  His diabetes is  complicated by  CAD, CKD.CVA, Retinopathy. his renal function is improving. Patient came with Loss of control of his  glucose profile,  And his A1c is   10.4% increasing from 8.1%. This is despite an effort to maximize his basal insulin.    Glucose logs and insulin administration records pertaining to this visit,  to be scanned into patient's records.  Recent labs reviewed.  - Patient remains at a high risk for more acute and chronic complications of diabetes which include CAD, CVA, CKD, retinopathy, and neuropathy. These are all discussed in detail with the patient.  - I have re-counseled the patient on diet management and weight loss  by adopting a carbohydrate restricted / protein rich  Diet. - Patient is advised to stick to a routine mealtimes to eat 3 meals  a day and avoid unnecessary snacks ( to snack only to correct hypoglycemia).  - Suggestion is made for patient to avoid simple carbohydrates   from their diet including Cakes , Desserts, Ice Cream,  Soda (  diet and regular) , Sweet Tea , Candies,  Chips, Cookies, Artificial Sweeteners,   and "Sugar-free" Products .  This will help patient to have stable blood glucose profile and potentially avoid unintended  Weight gain.  - The patient  Has been   scheduled with Jearld Fenton, RDN, CDE for  individualized DM education. - I have approached patient with the following individualized plan to manage diabetes and patient agrees.  - He will require basal/bolus insulin after assuring his commitment for monitoring blood glucose for safe use of insulin. - In the meantime,  I will  increase insulin Lantus 50 units qhs, associated with monitoring of glucose before meals and at bedtime and return in one week with his meter and logs for reevaluation.  -Patient is encouraged to call clinic for blood glucose levels less than 70 or above 300 mg /dl.  -Due to CKD patient is not a candidate for MTF, SGLT2i. He has history of metformin intolerance.  -I advised him to continue Januvia 52m po qday. -He  does not tolerate metformin, And not appropriate candidate due to CK D. -Patient will be considered for incretin therapy as appropriate next visit. -Target numbers for A1c, LDL, HDL, Triglycerides, Waist Circumference were discussed in detail.   2) BP/HTN: Much better blood pressure today.  Continue current medications including ACEI. 3) Lipids/HPL: Recent LDL at target levels 80, continue statins. 4)  Weight/Diet: CDE consult in progress, exercise, and carbohydrates information provided.  5) Hypothyroidism:  is now compliant with his thyroid hormone. I advised him to continue his LT4 200 mcg po qam.   - We discussed about correct intake of levothyroxine, at fasting, with water, separated by at least 30 minutes from breakfast, and separated by more than 4 hours from calcium, iron, multivitamins, acid  reflux medications (PPIs). -Patient is made aware of the fact that thyroid hormone replacement is needed for life, dose to be adjusted by periodic monitoring of thyroid function tests.  6) Chronic Care/Health Maintenance:  -Patient  on ACEI and Statin medications and encouraged to continue to follow up with Ophthalmology, Podiatrist at least yearly or according to recommendations, and advised to  stay  away from smoking. I have recommended yearly flu vaccine and pneumonia vaccination at least every 5 years; moderate intensity exercise for up to 150 minutes weekly; and  sleep for at least 7 hours a day.  I advised patient to maintain close follow up with their PCP for primary care needs.  Patient is asked to bring meter and  blood glucose logs during their next visit.   Follow up plan: Return in about 1 week (around 06/20/2016) for follow up with meter and logs- no labs.  Glade Lloyd, MD Phone: [phone]  Fax: [phone]   06/13/2016, 11:52 AM

## 2016-06-13 NOTE — Patient Instructions (Signed)

## 2016-06-19 DIAGNOSIS — G8929 Other chronic pain: Secondary | ICD-10-CM | POA: Diagnosis not present

## 2016-06-19 DIAGNOSIS — M25551 Pain in right hip: Secondary | ICD-10-CM | POA: Diagnosis not present

## 2016-06-21 ENCOUNTER — Encounter: Payer: Self-pay | Admitting: "Endocrinology

## 2016-06-21 ENCOUNTER — Ambulatory Visit (INDEPENDENT_AMBULATORY_CARE_PROVIDER_SITE_OTHER): Payer: Medicare Other | Admitting: "Endocrinology

## 2016-06-21 VITALS — BP 144/83 | HR 93 | Ht 69.0 in | Wt 277.0 lb

## 2016-06-21 DIAGNOSIS — I10 Essential (primary) hypertension: Secondary | ICD-10-CM

## 2016-06-21 DIAGNOSIS — E1159 Type 2 diabetes mellitus with other circulatory complications: Secondary | ICD-10-CM | POA: Diagnosis not present

## 2016-06-21 DIAGNOSIS — E782 Mixed hyperlipidemia: Secondary | ICD-10-CM | POA: Diagnosis not present

## 2016-06-21 DIAGNOSIS — E039 Hypothyroidism, unspecified: Secondary | ICD-10-CM

## 2016-06-21 DIAGNOSIS — E6609 Other obesity due to excess calories: Secondary | ICD-10-CM

## 2016-06-21 DIAGNOSIS — Z6841 Body Mass Index (BMI) 40.0 and over, adult: Secondary | ICD-10-CM | POA: Diagnosis not present

## 2016-06-21 DIAGNOSIS — IMO0001 Reserved for inherently not codable concepts without codable children: Secondary | ICD-10-CM

## 2016-06-21 MED ORDER — INSULIN GLARGINE 100 UNIT/ML SOLOSTAR PEN: 60.0000 [IU] | PEN_INJECTOR | Freq: Every day | SUBCUTANEOUS | 2 refills | Status: DC

## 2016-06-21 MED ORDER — INSULIN ASPART 100 UNIT/ML FLEXPEN: 10.0000 [IU] | PEN_INJECTOR | Freq: Three times a day (TID) | SUBCUTANEOUS | 2 refills | Status: DC

## 2016-06-21 NOTE — Patient Instructions (Signed)

## 2016-06-21 NOTE — Progress Notes (Signed)
Subjective:    Patient ID: [name], male    DOB: [birth year],    Past Medical History:  Diagnosis Date  . ASCVD (arteriosclerotic cardiovascular disease)    -MI in 01/2001 prompted CABG; EF-30% at cath; 50% on echo in 7/02 and normal in 2005  . Cerebrovascular disease     L CEA 06/2004 following left renal embolism; 10/2008 plaque w/o focal stenosis  . Chronic obstructive pulmonary disease ([place])   . Degenerative joint disease     chronic LBP-s/p L3-4 fusion  . Diabetes mellitus    -no insulin  . Hyperlipidemia   . Hypertension   . Hypothyroidism   . Obesity   . Restless leg syndrome    Possible  . Tobacco abuse, in remission    50 pack years; discontinued in 2002   Past Surgical History:  Procedure Laterality Date  . CAROTID ENDARTERECTOMY  2005   Left  . CATARACT EXTRACTION     bilateral  . CORONARY ARTERY BYPASS GRAFT  2002  . LUMBAR SPINE SURGERY     L3-4 fusion   Social History   Social History  . Marital status: Divorced    Spouse name: N/A  . Number of children: 1  . Years of education: N/A   Occupational History  . veteran     disabledf   Social History Main Topics  . Smoking status: Former Smoker    Packs/day: 1.00    Years: 50.00    Types: Cigarettes    Quit date: 01/15/2001  . Smokeless tobacco: Never Used  . Alcohol use No  . Drug use: No  . Sexual activity: Not Asked   Other Topics Concern  . None   Social History Narrative  . None   Outpatient Encounter Prescriptions as of 06/21/2016  Medication Sig  . albuterol-ipratropium (COMBIVENT) 18-103 MCG/ACT inhaler Inhale 2 puffs into the lungs every 6 (six) hours as needed.    Marland Kitchen aspirin 81 MG tablet Take 81 mg by mouth daily.    . B Complex-Biotin-FA (VITAMIN B50 COMPLEX PO) Take by mouth daily.  . Cranberry 400 MG TABS Take by mouth daily.  . diazepam (VALIUM) 10 MG tablet Take 10 mg by mouth every 6 (six) hours as needed.    . docusate sodium (COLACE) 100 MG capsule Take 100 mg  by mouth 2 (two) times daily.    Marland Kitchen escitalopram (LEXAPRO) 20 MG tablet Take 20 mg by mouth daily.    . fenofibrate 160 MG tablet Take 160 mg by mouth daily.    . fish oil-omega-3 fatty acids 1000 MG capsule Take 2 g by mouth daily.  Marland Kitchen HYDROcodone-acetaminophen (NORCO) 10-325 MG per tablet Take 1 tablet by mouth every 6 (six) hours as needed.    . Insulin Glargine (LANTUS SOLOSTAR) 100 UNIT/ML Solostar Pen Inject 50 Units into the skin daily at 10 pm.  . levothyroxine (SYNTHROID, LEVOTHROID) 200 MCG tablet Take 1 tablet (200 mcg total) by mouth daily.  . Multiple Vitamins-Minerals (MULTIVITAMIN WITH MINERALS) tablet Take 1 tablet by mouth daily.    . nebivolol (BYSTOLIC) 5 MG tablet Take 5 mg by mouth daily. Reported on 09/07/2015  . oxyCODONE-acetaminophen (ROXICET) 5-325 MG tablet Take 1 tablet by mouth every 4 (four) hours as needed for severe pain.  . simvastatin (ZOCOR) 40 MG tablet Take 40 mg by mouth at bedtime.    . sitaGLIPtin (JANUVIA) 50 MG tablet Take 1 tablet (50 mg total) by mouth daily.  Haig Prophet  COMFORT PEN NEEDLES 31G X 8 MM MISC USE AS DIRECTED WITH LANTUS.  Marland Kitchen vitamin B-12 (CYANOCOBALAMIN) 100 MCG tablet Take 100 mcg by mouth daily.   No facility-administered encounter medications on file as of 06/21/2016.    ALLERGIES: Allergies  Allergen Reactions  . Alprazolam   . Atorvastatin   . Fenofibrate   . Gemfibrozil   . Ibuprofen   . Invokana [Canagliflozin]    VACCINATION STATUS: Immunization History  Administered Date(s) Administered  . Influenza-Unspecified 05/13/2014    Diabetes  He presents for his follow-up diabetic visit. He has type 2 diabetes mellitus. Onset time: He was diagnosed at approximate age of 50 years. His disease course has been worsening. There are no hypoglycemic associated symptoms. Pertinent negatives for hypoglycemia include no confusion, headaches, pallor or seizures. Associated symptoms include polydipsia and polyuria. Pertinent negatives for  diabetes include no chest pain, no fatigue, no polyphagia and no weakness. There are no hypoglycemic complications. Symptoms are worsening. Diabetic complications include a CVA, heart disease and nephropathy. Risk factors for coronary artery disease include diabetes mellitus, dyslipidemia, hypertension, male sex and tobacco exposure. Current diabetic treatment includes insulin injections and oral agent (monotherapy). He is compliant with treatment most of the time. His weight is increasing steadily. He is following a generally unhealthy diet. When asked about meal planning, he reported none. He has had a previous visit with a dietitian. He never participates in exercise. His breakfast blood glucose range is generally >200 mg/dl. His lunch blood glucose range is generally >200 mg/dl. His dinner blood glucose range is generally >200 mg/dl. His overall blood glucose range is >200 mg/dl. An ACE inhibitor/angiotensin II receptor blocker is being taken. Eye exam is current.  Hyperlipidemia  This is a chronic problem. The current episode started more than 1 year ago. Exacerbating diseases include diabetes and obesity. Pertinent negatives include no chest pain, myalgias or shortness of breath. Current antihyperlipidemic treatment includes statins. Risk factors for coronary artery disease include family history, dyslipidemia, diabetes mellitus, hypertension, male sex and a sedentary lifestyle.  Hypertension  This is a chronic problem. The current episode started more than 1 year ago. Pertinent negatives include no chest pain, headaches, neck pain, palpitations or shortness of breath. Risk factors for coronary artery disease include diabetes mellitus, obesity, sedentary lifestyle and smoking/tobacco exposure. Past treatments include ACE inhibitors. Hypertensive end-organ damage includes CAD/MI and CVA.      Review of Systems  Constitutional: Negative for chills, fatigue, fever and unexpected weight change.  HENT:  Negative for dental problem, mouth sores and trouble swallowing.   Eyes: Negative for visual disturbance.  Respiratory: Negative for cough, choking, chest tightness, shortness of breath and wheezing.   Cardiovascular: Negative for chest pain, palpitations and leg swelling.  Gastrointestinal: Negative for abdominal distention, abdominal pain, constipation, diarrhea, nausea and vomiting.  Endocrine: Positive for polydipsia and polyuria. Negative for polyphagia.  Genitourinary: Negative for dysuria, flank pain, hematuria and urgency.  Musculoskeletal: Negative for back pain, gait problem, myalgias and neck pain.  Skin: Negative for pallor, rash and wound.  Neurological: Negative for seizures, syncope, weakness, numbness and headaches.  Psychiatric/Behavioral: Negative.  Negative for confusion and dysphoric mood.    Objective:    BP (!) 144/83   Pulse 93   Ht '5\' 9"'  (1.753 m)   Wt 277 lb (125.6 kg)   BMI 40.91 kg/m   Wt Readings from Last 3 Encounters:  06/21/16 277 lb (125.6 kg)  06/13/16 274 lb 0.6 oz (124.3 kg)  03/05/16 272 lb (123.4 kg)    Physical Exam  Constitutional: He is oriented to person, place, and time. He appears well-developed and well-nourished. He is cooperative. No distress.  HENT:  Head: Normocephalic and atraumatic.  Eyes: EOM are normal.  Neck: Normal range of motion. Neck supple. No tracheal deviation present. No thyromegaly present.  Cardiovascular: Normal rate, S1 normal, S2 normal and normal heart sounds.  Exam reveals no gallop.   No murmur heard. Pulses:      Dorsalis pedis pulses are 1+ on the right side, and 1+ on the left side.       Posterior tibial pulses are 1+ on the right side, and 1+ on the left side.  Pulmonary/Chest: Breath sounds normal. No respiratory distress. He has no wheezes.  Abdominal: Soft. Bowel sounds are normal. He exhibits no distension. There is no tenderness. There is no guarding and no CVA tenderness.  Musculoskeletal: He  exhibits no edema.       Right shoulder: He exhibits no swelling and no deformity.  Neurological: He is alert and oriented to person, place, and time. He has normal strength and normal reflexes. No cranial nerve deficit or sensory deficit. Gait normal.  Skin: Skin is warm and dry. No rash noted. No cyanosis. Nails show no clubbing.  Psychiatric: He has a normal mood and affect. His speech is normal and behavior is normal. Judgment and thought content normal. Cognition and memory are normal.    Results for orders placed or performed in visit on 03/05/16  CMP14+EGFR  Result Value Ref Range   Glucose 228 (H) 65 - 99 mg/dL   BUN 22 8 - 27 mg/dL   Creatinine, Ser 1.32 (H) 0.76 - 1.27 mg/dL   GFR calc non Af Amer 58 (L) >59 mL/min/1.73   GFR calc Af Amer 67 >59 mL/min/1.73   BUN/Creatinine Ratio 17 10 - 24   Sodium 136 134 - 144 mmol/L   Potassium 3.7 3.5 - 5.2 mmol/L   Chloride 96 96 - 106 mmol/L   CO2 21 18 - 29 mmol/L   Calcium 10.1 8.6 - 10.2 mg/dL   Total Protein 7.4 6.0 - 8.5 g/dL   Albumin 4.7 3.6 - 4.8 g/dL   Globulin, Total 2.7 1.5 - 4.5 g/dL   Albumin/Globulin Ratio 1.7 1.2 - 2.2   Bilirubin Total 0.3 0.0 - 1.2 mg/dL   Alkaline Phosphatase 48 39 - 117 IU/L   AST 58 (H) 0 - 40 IU/L   ALT 79 (H) 0 - 44 IU/L  Hemoglobin A1c  Result Value Ref Range   Hgb A1c MFr Bld 10.4 (H) 4.8 - 5.6 %   Est. average glucose Bld gHb Est-mCnc 252 mg/dL  TSH  Result Value Ref Range   TSH 1.020 0.450 - 4.500 uIU/mL  T4, free  Result Value Ref Range   Free T4 1.56 0.82 - 1.77 ng/dL   Lab Results  Component Value Date   WBC 9.1 09/27/2010   HGB 13.2 09/27/2010   HCT 42.7 09/27/2010   MCV 95.1 09/27/2010   PLT 174 09/27/2010    Lab Results  Component Value Date   HGBA1C 10.4 (H) 06/11/2016   HGBA1C 8.1 (H) 02/29/2016   HGBA1C 8.8 (H) 11/30/2015     Lipid Panel     Component Value Date/Time   CHOL 167 05/25/2015 0807   TRIG 230 (H) 05/25/2015 0807   TRIG 329 10/02/2007   HDL 41  05/25/2015 0807   CHOLHDL 3.4 Ratio 12/26/2009  1848   VLDL 39 12/26/2009 1848   LDLCALC 80 05/25/2015 0807   LDLCALC 66 10/02/2007     Assessment & Plan:   1. Type 2 diabetes mellitus with stage 3 chronic kidney disease, with long-term current use of insulin (HCC)  His diabetes is  complicated by  CAD, CKD.CVA, Retinopathy. his renal function is improving. Patient came with Loss of control of his  glucose profile,  And his A1c is   10.4% increasing from 8.1%. This is despite an effort to maximize his basal insulin.    Glucose logs and insulin administration records pertaining to this visit,  to be scanned into patient's records.  Recent labs reviewed.  - Patient remains at a high risk for more acute and chronic complications of diabetes which include CAD, CVA, CKD, retinopathy, and neuropathy. These are all discussed in detail with the patient.  - I have re-counseled the patient on diet management and weight loss  by adopting a carbohydrate restricted / protein rich  Diet. - Patient is advised to stick to a routine mealtimes to eat 3 meals  a day and avoid unnecessary snacks ( to snack only to correct hypoglycemia).  - Suggestion is made for patient to avoid simple carbohydrates   from their diet including Cakes , Desserts, Ice Cream,  Soda (  diet and regular) , Sweet Tea , Candies,  Chips, Cookies, Artificial Sweeteners,   and "Sugar-free" Products .  This will help patient to have stable blood glucose profile and potentially avoid unintended  Weight gain.  - The patient  Has been   scheduled with Jearld Fenton, RDN, CDE for individualized DM education. - I have approached patient with the following individualized plan to manage diabetes and patient agrees.  - He will require basal/bolus insulin after assuring his commitment for monitoring blood glucose for safe use of insulin. -   I will  increase insulin Lantus 60 units qhs, associated with monitoring of glucose before meals and at  bedtime, Initiate NovoLog 10 units 3 times a day before meals for pre-meal blood glucose above 90 mg/dL.  -Patient is encouraged to call clinic for blood glucose levels less than 70 or above 300 mg /dl.  -Due to CKD patient is not a candidate for MTF, SGLT2i. -I advised him to continue Januvia 50 mg po qday. -He  does not tolerate metformin, And not appropriate candidate due to CK D. -Patient will be considered for incretin therapy as appropriate next visit. -Target numbers for A1c, LDL, HDL, Triglycerides, Waist Circumference were discussed in detail.   2) BP/HTN: Much better blood pressure today.  Continue current medications including ACEI. 3) Lipids/HPL: Recent LDL at target levels 80, continue statins. 4)  Weight/Diet: CDE consult in progress, exercise, and carbohydrates information provided.  5) Hypothyroidism:  is now compliant with his thyroid hormone. I advised him to continue his levothyroxine at 200 mcg po qam.   - We discussed about correct intake of levothyroxine, at fasting, with water, separated by at least 30 minutes from breakfast, and separated by more than 4 hours from calcium, iron, multivitamins, acid reflux medications (PPIs). -Patient is made aware of the fact that thyroid hormone replacement is needed for life, dose to be adjusted by periodic monitoring of thyroid function tests.  6) Chronic Care/Health Maintenance:  -Patient  on ACEI and Statin medications and encouraged to continue to follow up with Ophthalmology, Podiatrist at least yearly or according to recommendations, and advised to  stay away from smoking.  I have recommended yearly flu vaccine and pneumonia vaccination at least every 5 years; moderate intensity exercise for up to 150 minutes weekly; and  sleep for at least 7 hours a day.  I advised patient to maintain close follow up with their PCP for primary care needs.  Patient is asked to bring meter and  blood glucose logs during their next visit.    Follow up plan: Return in about 2 weeks (around 07/05/2016) for follow up with meter and logs- no labs.  Glade Lloyd, MD Phone: [phone]  Fax: [phone]   06/21/2016, 11:32 AM

## 2016-07-10 ENCOUNTER — Ambulatory Visit (INDEPENDENT_AMBULATORY_CARE_PROVIDER_SITE_OTHER): Payer: Medicare Other | Admitting: "Endocrinology

## 2016-07-10 ENCOUNTER — Encounter: Payer: Self-pay | Admitting: "Endocrinology

## 2016-07-10 VITALS — BP 130/80 | HR 84 | Ht 69.0 in | Wt 275.0 lb

## 2016-07-10 DIAGNOSIS — I10 Essential (primary) hypertension: Secondary | ICD-10-CM

## 2016-07-10 DIAGNOSIS — E039 Hypothyroidism, unspecified: Secondary | ICD-10-CM

## 2016-07-10 DIAGNOSIS — E1159 Type 2 diabetes mellitus with other circulatory complications: Secondary | ICD-10-CM | POA: Diagnosis not present

## 2016-07-10 NOTE — Progress Notes (Signed)
Subjective:    Patient ID: [name], male    DOB: 01/10/1956,    Past Medical History:  Diagnosis Date  . ASCVD (arteriosclerotic cardiovascular disease)    -MI in 01/2001 prompted CABG; EF-30% at cath; 50% on echo in 7/02 and normal in 2005  . Cerebrovascular disease     L CEA 06/2004 following left renal embolism; 10/2008 plaque w/o focal stenosis  . Chronic obstructive pulmonary disease ([place])   . Degenerative joint disease     chronic LBP-s/p L3-4 fusion  . Diabetes mellitus    -no insulin  . Hyperlipidemia   . Hypertension   . Hypothyroidism   . Obesity   . Restless leg syndrome    Possible  . Tobacco abuse, in remission    50 pack years; discontinued in 2002   Past Surgical History:  Procedure Laterality Date  . CAROTID ENDARTERECTOMY  2005   Left  . CATARACT EXTRACTION     bilateral  . CORONARY ARTERY BYPASS GRAFT  2002  . LUMBAR SPINE SURGERY     L3-4 fusion   Social History   Social History  . Marital status: Divorced    Spouse name: N/A  . Number of children: 1  . Years of education: N/A   Occupational History  . veteran     disabledf   Social History Main Topics  . Smoking status: Former Smoker    Packs/day: 1.00    Years: 50.00    Types: Cigarettes    Quit date: 01/15/2001  . Smokeless tobacco: Never Used  . Alcohol use No  . Drug use: No  . Sexual activity: Not Asked   Other Topics Concern  . None   Social History Narrative  . None   Outpatient Encounter Prescriptions as of 07/10/2016  Medication Sig  . albuterol-ipratropium (COMBIVENT) 18-103 MCG/ACT inhaler Inhale 2 puffs into the lungs every 6 (six) hours as needed.    Marland Kitchen aspirin 81 MG tablet Take 81 mg by mouth daily.    . B Complex-Biotin-FA (VITAMIN B50 COMPLEX PO) Take by mouth daily.  . Cranberry 400 MG TABS Take by mouth daily.  . diazepam (VALIUM) 10 MG tablet Take 10 mg by mouth every 6 (six) hours as needed.    . docusate sodium (COLACE) 100 MG capsule Take 100 mg  by mouth 2 (two) times daily.    Marland Kitchen escitalopram (LEXAPRO) 20 MG tablet Take 20 mg by mouth daily.    . fenofibrate 160 MG tablet Take 160 mg by mouth daily.    . fish oil-omega-3 fatty acids 1000 MG capsule Take 2 g by mouth daily.  Marland Kitchen HYDROcodone-acetaminophen (NORCO) 10-325 MG per tablet Take 1 tablet by mouth every 6 (six) hours as needed.    . insulin aspart (NOVOLOG FLEXPEN) 100 UNIT/ML FlexPen Inject 10-16 Units into the skin 3 (three) times daily with meals.  . Insulin Glargine (LANTUS SOLOSTAR) 100 UNIT/ML Solostar Pen Inject 60 Units into the skin daily at 10 pm.  . levothyroxine (SYNTHROID, LEVOTHROID) 200 MCG tablet Take 1 tablet (200 mcg total) by mouth daily.  . Multiple Vitamins-Minerals (MULTIVITAMIN WITH MINERALS) tablet Take 1 tablet by mouth daily.    . simvastatin (ZOCOR) 40 MG tablet Take 40 mg by mouth at bedtime.    . sitaGLIPtin (JANUVIA) 50 MG tablet Take 1 tablet (50 mg total) by mouth daily.  . SURE COMFORT PEN NEEDLES 31G X 8 MM MISC USE AS DIRECTED WITH LANTUS.  Marland Kitchen vitamin  B-12 (CYANOCOBALAMIN) 100 MCG tablet Take 100 mcg by mouth daily.  . [DISCONTINUED] nebivolol (BYSTOLIC) 5 MG tablet Take 5 mg by mouth daily. Reported on 09/07/2015  . [DISCONTINUED] oxyCODONE-acetaminophen (ROXICET) 5-325 MG tablet Take 1 tablet by mouth every 4 (four) hours as needed for severe pain.   No facility-administered encounter medications on file as of 07/10/2016.    ALLERGIES: Allergies  Allergen Reactions  . Alprazolam   . Atorvastatin   . Fenofibrate   . Gemfibrozil   . Ibuprofen   . Invokana [Canagliflozin]    VACCINATION STATUS: Immunization History  Administered Date(s) Administered  . Influenza-Unspecified 05/13/2014    Diabetes  He presents for his follow-up diabetic visit. He has type 2 diabetes mellitus. Onset time: He was diagnosed at approximate age of 63 years. His disease course has been improving. There are no hypoglycemic associated symptoms. Pertinent  negatives for hypoglycemia include no confusion, headaches, pallor or seizures. Associated symptoms include polydipsia and polyuria. Pertinent negatives for diabetes include no chest pain, no fatigue, no polyphagia and no weakness. There are no hypoglycemic complications. Symptoms are improving. Diabetic complications include a CVA, heart disease and nephropathy. Risk factors for coronary artery disease include diabetes mellitus, dyslipidemia, hypertension, male sex and tobacco exposure. Current diabetic treatment includes insulin injections and oral agent (monotherapy). He is compliant with treatment most of the time. His weight is stable. He is following a generally unhealthy diet. When asked about meal planning, he reported none. He has had a previous visit with a dietitian. He never participates in exercise. His breakfast blood glucose range is generally 140-180 mg/dl. His lunch blood glucose range is generally 140-180 mg/dl. His dinner blood glucose range is generally 140-180 mg/dl. His overall blood glucose range is 140-180 mg/dl. An ACE inhibitor/angiotensin II receptor blocker is being taken. Eye exam is current.  Hyperlipidemia  This is a chronic problem. The current episode started more than 1 year ago. Exacerbating diseases include diabetes and obesity. Pertinent negatives include no chest pain, myalgias or shortness of breath. Current antihyperlipidemic treatment includes statins. Risk factors for coronary artery disease include family history, dyslipidemia, diabetes mellitus, hypertension, male sex and a sedentary lifestyle.  Hypertension  This is a chronic problem. The current episode started more than 1 year ago. Pertinent negatives include no chest pain, headaches, neck pain, palpitations or shortness of breath. Risk factors for coronary artery disease include diabetes mellitus, obesity, sedentary lifestyle and smoking/tobacco exposure. Past treatments include ACE inhibitors. Hypertensive  end-organ damage includes CAD/MI and CVA.      Review of Systems  Constitutional: Negative for chills, fatigue, fever and unexpected weight change.  HENT: Negative for dental problem, mouth sores and trouble swallowing.   Eyes: Negative for visual disturbance.  Respiratory: Negative for cough, choking, chest tightness, shortness of breath and wheezing.   Cardiovascular: Negative for chest pain, palpitations and leg swelling.  Gastrointestinal: Negative for abdominal distention, abdominal pain, constipation, diarrhea, nausea and vomiting.  Endocrine: Positive for polydipsia and polyuria. Negative for polyphagia.  Genitourinary: Negative for dysuria, flank pain, hematuria and urgency.  Musculoskeletal: Negative for back pain, gait problem, myalgias and neck pain.  Skin: Negative for pallor, rash and wound.  Neurological: Negative for seizures, syncope, weakness, numbness and headaches.  Psychiatric/Behavioral: Negative.  Negative for confusion and dysphoric mood.    Objective:    BP 130/80   Pulse 84   Ht '5\' 9"'  (1.753 m)   Wt 275 lb (124.7 kg)   BMI 40.61 kg/m  Wt Readings from Last 3 Encounters:  07/10/16 275 lb (124.7 kg)  06/21/16 277 lb (125.6 kg)  06/13/16 274 lb 0.6 oz (124.3 kg)    Physical Exam  Constitutional: He is oriented to person, place, and time. He appears well-developed and well-nourished. He is cooperative. No distress.  HENT:  Head: Normocephalic and atraumatic.  Eyes: EOM are normal.  Neck: Normal range of motion. Neck supple. No tracheal deviation present. No thyromegaly present.  Cardiovascular: Normal rate, S1 normal, S2 normal and normal heart sounds.  Exam reveals no gallop.   No murmur heard. Pulses:      Dorsalis pedis pulses are 1+ on the right side, and 1+ on the left side.       Posterior tibial pulses are 1+ on the right side, and 1+ on the left side.  Pulmonary/Chest: Breath sounds normal. No respiratory distress. He has no wheezes.   Abdominal: Soft. Bowel sounds are normal. He exhibits no distension. There is no tenderness. There is no guarding and no CVA tenderness.  Musculoskeletal: He exhibits no edema.       Right shoulder: He exhibits no swelling and no deformity.  Neurological: He is alert and oriented to person, place, and time. He has normal strength and normal reflexes. No cranial nerve deficit or sensory deficit. Gait normal.  Skin: Skin is warm and dry. No rash noted. No cyanosis. Nails show no clubbing.  Psychiatric: He has a normal mood and affect. His speech is normal and behavior is normal. Judgment and thought content normal. Cognition and memory are normal.    Results for orders placed or performed in visit on 03/05/16  CMP14+EGFR  Result Value Ref Range   Glucose 228 (H) 65 - 99 mg/dL   BUN 22 8 - 27 mg/dL   Creatinine, Ser 1.32 (H) 0.76 - 1.27 mg/dL   GFR calc non Af Amer 58 (L) >59 mL/min/1.73   GFR calc Af Amer 67 >59 mL/min/1.73   BUN/Creatinine Ratio 17 10 - 24   Sodium 136 134 - 144 mmol/L   Potassium 3.7 3.5 - 5.2 mmol/L   Chloride 96 96 - 106 mmol/L   CO2 21 18 - 29 mmol/L   Calcium 10.1 8.6 - 10.2 mg/dL   Total Protein 7.4 6.0 - 8.5 g/dL   Albumin 4.7 3.6 - 4.8 g/dL   Globulin, Total 2.7 1.5 - 4.5 g/dL   Albumin/Globulin Ratio 1.7 1.2 - 2.2   Bilirubin Total 0.3 0.0 - 1.2 mg/dL   Alkaline Phosphatase 48 39 - 117 IU/L   AST 58 (H) 0 - 40 IU/L   ALT 79 (H) 0 - 44 IU/L  Hemoglobin A1c  Result Value Ref Range   Hgb A1c MFr Bld 10.4 (H) 4.8 - 5.6 %   Est. average glucose Bld gHb Est-mCnc 252 mg/dL  TSH  Result Value Ref Range   TSH 1.020 0.450 - 4.500 uIU/mL  T4, free  Result Value Ref Range   Free T4 1.56 0.82 - 1.77 ng/dL   Lab Results  Component Value Date   WBC 9.1 09/27/2010   HGB 13.2 09/27/2010   HCT 42.7 09/27/2010   MCV 95.1 09/27/2010   PLT 174 09/27/2010    Lab Results  Component Value Date   HGBA1C 10.4 (H) 06/11/2016   HGBA1C 8.1 (H) 02/29/2016   HGBA1C  8.8 (H) 11/30/2015     Lipid Panel     Component Value Date/Time   CHOL 167 05/25/2015 0807   TRIG  230 (H) 05/25/2015 0807   TRIG 329 10/02/2007   HDL 41 05/25/2015 0807   CHOLHDL 3.4 Ratio 12/26/2009 1848   VLDL 39 12/26/2009 1848   LDLCALC 80 05/25/2015 0807   LDLCALC 66 10/02/2007     Assessment & Plan:   1. Type 2 diabetes mellitus with stage 3 chronic kidney disease, with long-term current use of insulin (HCC)  His diabetes is  complicated by  CAD, CKD.CVA, Retinopathy. his renal function is improving. Patient came with brtter control of his  glucose profile,  but his  Recent A1c is   10.4% increasing from 8.1%.    Glucose logs and insulin administration records pertaining to this visit,  to be scanned into patient's records.  Recent labs reviewed.  - Patient remains at a high risk for more acute and chronic complications of diabetes which include CAD, CVA, CKD, retinopathy, and neuropathy. These are all discussed in detail with the patient.  - I have re-counseled the patient on diet management and weight loss  by adopting a carbohydrate restricted / protein rich  Diet. - Patient is advised to stick to a routine mealtimes to eat 3 meals  a day and avoid unnecessary snacks ( to snack only to correct hypoglycemia).  - Suggestion is made for patient to avoid simple carbohydrates   from their diet including Cakes , Desserts, Ice Cream,  Soda (  diet and regular) , Sweet Tea , Candies,  Chips, Cookies, Artificial Sweeteners,   and "Sugar-free" Products .  This will help patient to have stable blood glucose profile and potentially avoid unintended  Weight gain.  - The patient  Has been   scheduled with Jearld Fenton, RDN, CDE for individualized DM education. - I have approached patient with the following individualized plan to manage diabetes and patient agrees.  - He will continue to require basal/bolus insulin . -   I will  Continue   Lantus 60 units qhs,  Novolog 10 units 3  times a day before meals for pre-meal blood glucose above 90 mg/dL plus correction associated with monitoring of glucose before meals and at bedtime. -Patient is encouraged to call clinic for blood glucose levels less than 70 or above 300 mg /dl.  -Due to CKD patient is not a candidate for MTF, SGLT2i. -I advised him to continue Januvia 50 mg po qday. -He  does not tolerate metformin, And not appropriate candidate due to CK D.  -Target numbers for A1c, LDL, HDL, Triglycerides, Waist Circumference were discussed in detail.   2) BP/HTN: Much better blood pressure today.  Continue current medications including ACEI. 3) Lipids/HPL: Recent LDL at target levels 80, continue statins. 4)  Weight/Diet: CDE consult in progress, exercise, and carbohydrates information provided.  5) Hypothyroidism:  is now compliant with his thyroid hormone. I advised him to continue his levothyroxine at 200 mcg po qam.   - We discussed about correct intake of levothyroxine, at fasting, with water, separated by at least 30 minutes from breakfast, and separated by more than 4 hours from calcium, iron, multivitamins, acid reflux medications (PPIs). -Patient is made aware of the fact that thyroid hormone replacement is needed for life, dose to be adjusted by periodic monitoring of thyroid function tests.  6) Chronic Care/Health Maintenance:  -Patient  on ACEI and Statin medications and encouraged to continue to follow up with Ophthalmology, Podiatrist at least yearly or according to recommendations, and advised to  stay away from smoking. I have recommended yearly flu vaccine  and pneumonia vaccination at least every 5 years; moderate intensity exercise for up to 150 minutes weekly; and  sleep for at least 7 hours a day.  I advised patient to maintain close follow up with their PCP for primary care needs.  Patient is asked to bring meter and  blood glucose logs during their next visit.   Follow up plan: Return in about 3  months (around 10/10/2016) for follow up with pre-visit labs, meter, and logs.  Glade Lloyd, MD Phone: [phone]  Fax: [phone]   07/10/2016, 11:08 AM

## 2016-07-10 NOTE — Patient Instructions (Signed)

## 2016-07-17 DIAGNOSIS — M47816 Spondylosis without myelopathy or radiculopathy, lumbar region: Secondary | ICD-10-CM | POA: Diagnosis not present

## 2016-08-07 ENCOUNTER — Other Ambulatory Visit: Payer: Self-pay | Admitting: "Endocrinology

## 2016-10-08 DIAGNOSIS — E1159 Type 2 diabetes mellitus with other circulatory complications: Secondary | ICD-10-CM | POA: Diagnosis not present

## 2016-10-08 DIAGNOSIS — E118 Type 2 diabetes mellitus with unspecified complications: Secondary | ICD-10-CM | POA: Diagnosis not present

## 2016-10-08 DIAGNOSIS — E039 Hypothyroidism, unspecified: Secondary | ICD-10-CM | POA: Diagnosis not present

## 2016-10-09 LAB — CMP14+EGFR
A/G RATIO: 2 (ref 1.2–2.2)
ALT: 56 IU/L — ABNORMAL HIGH (ref 0–44)
AST: 31 IU/L (ref 0–40)
Albumin: 4.8 g/dL (ref 3.6–4.8)
Alkaline Phosphatase: 48 IU/L (ref 39–117)
BILIRUBIN TOTAL: 0.5 mg/dL (ref 0.0–1.2)
BUN/Creatinine Ratio: 15 (ref 10–24)
BUN: 18 mg/dL (ref 8–27)
CALCIUM: 10.1 mg/dL (ref 8.6–10.2)
CO2: 24 mmol/L (ref 18–29)
Chloride: 99 mmol/L (ref 96–106)
Creatinine, Ser: 1.23 mg/dL (ref 0.76–1.27)
GFR, EST AFRICAN AMERICAN: 73 mL/min/{1.73_m2} (ref >59)
GFR, EST NON AFRICAN AMERICAN: 63 mL/min/{1.73_m2} (ref >59)
GLOBULIN, TOTAL: 2.4 g/dL (ref 1.5–4.5)
Glucose: 132 mg/dL — ABNORMAL HIGH (ref 65–99)
POTASSIUM: 4.2 mmol/L (ref 3.5–5.2)
SODIUM: 141 mmol/L (ref 134–144)
Total Protein: 7.2 g/dL (ref 6.0–8.5)

## 2016-10-09 LAB — T4, FREE: Free T4: 0.93 ng/dL (ref 0.82–1.77)

## 2016-10-09 LAB — TSH: TSH: 6.48 u[IU]/mL — AB (ref 0.450–4.500)

## 2016-10-09 LAB — HEMOGLOBIN A1C
ESTIMATED AVERAGE GLUCOSE: 183 mg/dL
HEMOGLOBIN A1C: 8 % — AB (ref 4.8–5.6)

## 2016-10-11 ENCOUNTER — Encounter: Payer: Self-pay | Admitting: "Endocrinology

## 2016-10-11 ENCOUNTER — Ambulatory Visit (INDEPENDENT_AMBULATORY_CARE_PROVIDER_SITE_OTHER): Payer: Medicare Other | Admitting: "Endocrinology

## 2016-10-11 VITALS — BP 137/81 | HR 81 | Ht 69.0 in | Wt 284.0 lb

## 2016-10-11 DIAGNOSIS — I10 Essential (primary) hypertension: Secondary | ICD-10-CM

## 2016-10-11 DIAGNOSIS — Z6841 Body Mass Index (BMI) 40.0 and over, adult: Secondary | ICD-10-CM

## 2016-10-11 DIAGNOSIS — E039 Hypothyroidism, unspecified: Secondary | ICD-10-CM | POA: Diagnosis not present

## 2016-10-11 DIAGNOSIS — E6609 Other obesity due to excess calories: Secondary | ICD-10-CM | POA: Diagnosis not present

## 2016-10-11 DIAGNOSIS — IMO0001 Reserved for inherently not codable concepts without codable children: Secondary | ICD-10-CM

## 2016-10-11 DIAGNOSIS — E1159 Type 2 diabetes mellitus with other circulatory complications: Secondary | ICD-10-CM | POA: Diagnosis not present

## 2016-10-11 DIAGNOSIS — E782 Mixed hyperlipidemia: Secondary | ICD-10-CM

## 2016-10-11 MED ORDER — LEVOTHYROXINE SODIUM 137 MCG PO TABS: 137.0000 ug | ORAL_TABLET | Freq: Every day | ORAL | 3 refills | Status: DC

## 2016-10-11 MED ORDER — SITAGLIPTIN PHOSPHATE 50 MG PO TABS: 50.0000 mg | ORAL_TABLET | Freq: Every day | ORAL | 0 refills | Status: DC

## 2016-10-11 NOTE — Progress Notes (Signed)
Subjective:    Patient ID: [name], male    DOB: [birth year],    Past Medical History:  Diagnosis Date  . ASCVD (arteriosclerotic cardiovascular disease)    -MI in 01/2001 prompted CABG; EF-30% at cath; 50% on echo in 7/02 and normal in 2005  . Cerebrovascular disease     L CEA 06/2004 following left renal embolism; 10/2008 plaque w/o focal stenosis  . Chronic obstructive pulmonary disease ([place])   . Degenerative joint disease     chronic LBP-s/p L3-4 fusion  . Diabetes mellitus    -no insulin  . Hyperlipidemia   . Hypertension   . Hypothyroidism   . Obesity   . Restless leg syndrome    Possible  . Tobacco abuse, in remission    50 pack years; discontinued in 2002   Past Surgical History:  Procedure Laterality Date  . CAROTID ENDARTERECTOMY  2005   Left  . CATARACT EXTRACTION     bilateral  . CORONARY ARTERY BYPASS GRAFT  2002  . LUMBAR SPINE SURGERY     L3-4 fusion   Social History   Social History  . Marital status: Divorced    Spouse name: N/A  . Number of children: 1  . Years of education: N/A   Occupational History  . veteran     disabledf   Social History Main Topics  . Smoking status: Former Smoker    Packs/day: 1.00    Years: 50.00    Types: Cigarettes    Quit date: 01/15/2001  . Smokeless tobacco: Never Used  . Alcohol use No  . Drug use: No  . Sexual activity: Not Asked   Other Topics Concern  . None   Social History Narrative  . None   Outpatient Encounter Prescriptions as of 10/11/2016  Medication Sig  . albuterol-ipratropium (COMBIVENT) 18-103 MCG/ACT inhaler Inhale 2 puffs into the lungs every 6 (six) hours as needed.    Marland Kitchen aspirin 81 MG tablet Take 81 mg by mouth daily.    . B Complex-Biotin-FA (VITAMIN B50 COMPLEX PO) Take by mouth daily.  . Cranberry 400 MG TABS Take by mouth daily.  . diazepam (VALIUM) 10 MG tablet Take 10 mg by mouth every 6 (six) hours as needed.    . docusate sodium (COLACE) 100 MG capsule Take 100 mg by  mouth 2 (two) times daily.    Marland Kitchen escitalopram (LEXAPRO) 20 MG tablet Take 20 mg by mouth daily.    . fenofibrate 160 MG tablet Take 160 mg by mouth daily.    . fish oil-omega-3 fatty acids 1000 MG capsule Take 2 g by mouth daily.  Marland Kitchen HYDROcodone-acetaminophen (NORCO) 10-325 MG per tablet Take 1 tablet by mouth every 6 (six) hours as needed.    . insulin aspart (NOVOLOG FLEXPEN) 100 UNIT/ML FlexPen Inject 10-16 Units into the skin 3 (three) times daily with meals.  . Insulin Glargine (LANTUS SOLOSTAR) 100 UNIT/ML Solostar Pen Inject 60 Units into the skin at bedtime.  Marland Kitchen levothyroxine (SYNTHROID, LEVOTHROID) 137 MCG tablet Take 1 tablet (137 mcg total) by mouth daily.  . Multiple Vitamins-Minerals (MULTIVITAMIN WITH MINERALS) tablet Take 1 tablet by mouth daily.    . simvastatin (ZOCOR) 40 MG tablet Take 40 mg by mouth at bedtime.    . sitaGLIPtin (JANUVIA) 50 MG tablet Take 1 tablet (50 mg total) by mouth daily.  . SURE COMFORT PEN NEEDLES 31G X 8 MM MISC USE AS DIRECTED WITH LANTUS.  Marland Kitchen vitamin B-12 (CYANOCOBALAMIN)  100 MCG tablet Take 100 mcg by mouth daily.  . [DISCONTINUED] levothyroxine (SYNTHROID, LEVOTHROID) 200 MCG tablet Take 1 tablet (200 mcg total) by mouth daily.  . [DISCONTINUED] sitaGLIPtin (JANUVIA) 50 MG tablet Take 1 tablet (50 mg total) by mouth daily.   No facility-administered encounter medications on file as of 10/11/2016.    ALLERGIES: Allergies  Allergen Reactions  . Alprazolam   . Atorvastatin   . Fenofibrate   . Gemfibrozil   . Ibuprofen   . Invokana [Canagliflozin]    VACCINATION STATUS: Immunization History  Administered Date(s) Administered  . Influenza-Unspecified 05/13/2014    Diabetes  He presents for his follow-up diabetic visit. He has type 2 diabetes mellitus. Onset time: He was diagnosed at approximate age of 56 years. His disease course has been improving. There are no hypoglycemic associated symptoms. Pertinent negatives for hypoglycemia include no  confusion, headaches, pallor or seizures. Associated symptoms include polydipsia and polyuria. Pertinent negatives for diabetes include no chest pain, no fatigue, no polyphagia and no weakness. There are no hypoglycemic complications. Symptoms are improving. Diabetic complications include a CVA, heart disease and nephropathy. Risk factors for coronary artery disease include diabetes mellitus, dyslipidemia, hypertension, male sex and tobacco exposure. Current diabetic treatment includes insulin injections and oral agent (monotherapy). He is compliant with treatment most of the time. His weight is stable. He is following a generally unhealthy diet. When asked about meal planning, he reported none. He has had a previous visit with a dietitian. He never participates in exercise. His breakfast blood glucose range is generally 140-180 mg/dl. His lunch blood glucose range is generally 140-180 mg/dl. His dinner blood glucose range is generally 140-180 mg/dl. His overall blood glucose range is 140-180 mg/dl. An ACE inhibitor/angiotensin II receptor blocker is being taken. Eye exam is current.  Hyperlipidemia  This is a chronic problem. The current episode started more than 1 year ago. Exacerbating diseases include diabetes and obesity. Pertinent negatives include no chest pain, myalgias or shortness of breath. Current antihyperlipidemic treatment includes statins. Risk factors for coronary artery disease include family history, dyslipidemia, diabetes mellitus, hypertension, male sex and a sedentary lifestyle.  Hypertension  This is a chronic problem. The current episode started more than 1 year ago. Pertinent negatives include no chest pain, headaches, neck pain, palpitations or shortness of breath. Risk factors for coronary artery disease include diabetes mellitus, obesity, sedentary lifestyle and smoking/tobacco exposure. Past treatments include ACE inhibitors. Hypertensive end-organ damage includes CAD/MI and CVA.       Review of Systems  Constitutional: Negative for chills, fatigue, fever and unexpected weight change.  HENT: Negative for dental problem, mouth sores and trouble swallowing.   Eyes: Negative for visual disturbance.  Respiratory: Negative for cough, choking, chest tightness, shortness of breath and wheezing.   Cardiovascular: Negative for chest pain, palpitations and leg swelling.  Gastrointestinal: Negative for abdominal distention, abdominal pain, constipation, diarrhea, nausea and vomiting.  Endocrine: Positive for polydipsia and polyuria. Negative for polyphagia.  Genitourinary: Negative for dysuria, flank pain, hematuria and urgency.  Musculoskeletal: Negative for back pain, gait problem, myalgias and neck pain.  Skin: Negative for pallor, rash and wound.  Neurological: Negative for seizures, syncope, weakness, numbness and headaches.  Psychiatric/Behavioral: Negative.  Negative for confusion and dysphoric mood.    Objective:    BP 137/81   Pulse 81   Ht '5\' 9"'  (1.753 m)   Wt 284 lb (128.8 kg)   BMI 41.94 kg/m   Wt Readings from Last 3 Encounters:  10/11/16 284 lb (128.8 kg)  07/10/16 275 lb (124.7 kg)  06/21/16 277 lb (125.6 kg)    Physical Exam  Constitutional: He is oriented to person, place, and time. He appears well-developed and well-nourished. He is cooperative. No distress.  HENT:  Head: Normocephalic and atraumatic.  Eyes: EOM are normal.  Neck: Normal range of motion. Neck supple. No tracheal deviation present. No thyromegaly present.  Cardiovascular: Normal rate, S1 normal, S2 normal and normal heart sounds.  Exam reveals no gallop.   No murmur heard. Pulses:      Dorsalis pedis pulses are 1+ on the right side, and 1+ on the left side.       Posterior tibial pulses are 1+ on the right side, and 1+ on the left side.  Pulmonary/Chest: Breath sounds normal. No respiratory distress. He has no wheezes.  Abdominal: Soft. Bowel sounds are normal. He exhibits no  distension. There is no tenderness. There is no guarding and no CVA tenderness.  Musculoskeletal: He exhibits no edema.       Right shoulder: He exhibits no swelling and no deformity.  Neurological: He is alert and oriented to person, place, and time. He has normal strength and normal reflexes. No cranial nerve deficit or sensory deficit. Gait normal.  Skin: Skin is warm and dry. No rash noted. No cyanosis. Nails show no clubbing.  Psychiatric: He has a normal mood and affect. His speech is normal and behavior is normal. Judgment and thought content normal. Cognition and memory are normal.    Results for orders placed or performed in visit on 07/10/16  CMP14+EGFR  Result Value Ref Range   Glucose 132 (H) 65 - 99 mg/dL   BUN 18 8 - 27 mg/dL   Creatinine, Ser 1.23 0.76 - 1.27 mg/dL   GFR calc non Af Amer 63 >59 mL/min/1.73   GFR calc Af Amer 73 >59 mL/min/1.73   BUN/Creatinine Ratio 15 10 - 24   Sodium 141 134 - 144 mmol/L   Potassium 4.2 3.5 - 5.2 mmol/L   Chloride 99 96 - 106 mmol/L   CO2 24 18 - 29 mmol/L   Calcium 10.1 8.6 - 10.2 mg/dL   Total Protein 7.2 6.0 - 8.5 g/dL   Albumin 4.8 3.6 - 4.8 g/dL   Globulin, Total 2.4 1.5 - 4.5 g/dL   Albumin/Globulin Ratio 2.0 1.2 - 2.2   Bilirubin Total 0.5 0.0 - 1.2 mg/dL   Alkaline Phosphatase 48 39 - 117 IU/L   AST 31 0 - 40 IU/L   ALT 56 (H) 0 - 44 IU/L  Hemoglobin A1c  Result Value Ref Range   Hgb A1c MFr Bld 8.0 (H) 4.8 - 5.6 %   Est. average glucose Bld gHb Est-mCnc 183 mg/dL  T4, free  Result Value Ref Range   Free T4 0.93 0.82 - 1.77 ng/dL  TSH  Result Value Ref Range   TSH 6.480 (H) 0.450 - 4.500 uIU/mL   Lab Results  Component Value Date   WBC 9.1 09/27/2010   HGB 13.2 09/27/2010   HCT 42.7 09/27/2010   MCV 95.1 09/27/2010   PLT 174 09/27/2010    Lab Results  Component Value Date   HGBA1C 8.0 (H) 10/08/2016   HGBA1C 10.4 (H) 06/11/2016   HGBA1C 8.1 (H) 02/29/2016     Lipid Panel     Component Value Date/Time    CHOL 167 05/25/2015 0807   TRIG 230 (H) 05/25/2015 0807   TRIG 329 10/02/2007  HDL 41 05/25/2015 0807   CHOLHDL 3.4 Ratio 12/26/2009 1848   VLDL 39 12/26/2009 1848   LDLCALC 80 05/25/2015 0807   LDLCALC 66 10/02/2007     Assessment & Plan:   1. Type 2 diabetes mellitus with stage 3 chronic kidney disease, with long-term current use of insulin (HCC)  His diabetes is  complicated by  CAD, CKD.CVA, Retinopathy. his renal function is improving.  Patient came with Continued improvement in his glucose profile,  A1c improving to 8% from 10.4%.     Glucose logs and insulin administration records pertaining to this visit,  to be scanned into patient's records.  Recent labs reviewed.  - Patient remains at a high risk for more acute and chronic complications of diabetes which include CAD, CVA, CKD, retinopathy, and neuropathy. These are all discussed in detail with the patient.  - I have re-counseled the patient on diet management and weight loss  by adopting a carbohydrate restricted / protein rich  Diet. - Patient is advised to stick to a routine mealtimes to eat 3 meals  a day and avoid unnecessary snacks ( to snack only to correct hypoglycemia).  - Suggestion is made for patient to avoid simple carbohydrates   from their diet including Cakes , Desserts, Ice Cream,  Soda (  diet and regular) , Sweet Tea , Candies,  Chips, Cookies, Artificial Sweeteners,   and "Sugar-free" Products .  This will help patient to have stable blood glucose profile and potentially avoid unintended  Weight gain.  - The patient  Has been   scheduled with Jearld Fenton, RDN, CDE for individualized DM education. - I have approached patient with the following individualized plan to manage diabetes and patient agrees.  -  He will continue to require basal/bolus insulin . -   I will  continue  Lantus 60 units qhs,  Novolog 10 units 3 times a day before meals for pre-meal blood glucose above 90 mg/dL plus correction  associated with monitoring of glucose before meals and at bedtime. -Patient is encouraged to call clinic for blood glucose levels less than 70 or above 300 mg /dl.  -Due to CKD patient is not a candidate for MTF, SGLT2i. -I advised him to continue Januvia 50 mg po qday. -He  does not tolerate metformin, and not appropriate candidate due to CK D.  -Target numbers for A1c, LDL, HDL, Triglycerides, Waist Circumference were discussed in detail.   2) BP/HTN: Much better blood pressure today.  Continue current medications including ACEI. 3) Lipids/HPL: Recent LDL at target levels 80, continue statins. 4)  Weight/Diet: CDE consult in progress, exercise, and carbohydrates information provided.  5) Hypothyroidism:  is now compliant with his thyroid hormone. - Based on his thyroid function tests he will benefit from slight increase in his levothyroxine. I will prescribe levothyroxine 137 g by mouth every morning.   - We discussed about correct intake of levothyroxine, at fasting, with water, separated by at least 30 minutes from breakfast, and separated by more than 4 hours from calcium, iron, multivitamins, acid reflux medications (PPIs). -Patient is made aware of the fact that thyroid hormone replacement is needed for life, dose to be adjusted by periodic monitoring of thyroid function tests.  6) Chronic Care/Health Maintenance:  -Patient  on ACEI and Statin medications and encouraged to continue to follow up with Ophthalmology, Podiatrist at least yearly or according to recommendations, and advised to  stay away from smoking. I have recommended yearly flu vaccine and pneumonia  vaccination at least every 5 years; moderate intensity exercise for up to 150 minutes weekly; and  sleep for at least 7 hours a day.  I advised patient to maintain close follow up with their PCP for primary care needs.  Patient is asked to bring meter and  blood glucose logs during their next visit.   Follow up  plan: Return in about 3 months (around 01/11/2017) for meter, and logs.  Glade Lloyd, MD Phone: [phone]  Fax: [phone]   10/11/2016, 11:44 AM

## 2016-10-11 NOTE — Patient Instructions (Signed)

## 2016-10-15 DIAGNOSIS — E782 Mixed hyperlipidemia: Secondary | ICD-10-CM | POA: Diagnosis not present

## 2016-10-15 DIAGNOSIS — Z125 Encounter for screening for malignant neoplasm of prostate: Secondary | ICD-10-CM | POA: Diagnosis not present

## 2016-10-15 DIAGNOSIS — I2510 Atherosclerotic heart disease of native coronary artery without angina pectoris: Secondary | ICD-10-CM | POA: Diagnosis not present

## 2016-10-15 DIAGNOSIS — Z1389 Encounter for screening for other disorder: Secondary | ICD-10-CM | POA: Diagnosis not present

## 2016-10-15 DIAGNOSIS — J449 Chronic obstructive pulmonary disease, unspecified: Secondary | ICD-10-CM | POA: Diagnosis not present

## 2016-10-15 DIAGNOSIS — G8929 Other chronic pain: Secondary | ICD-10-CM | POA: Diagnosis not present

## 2016-10-24 DIAGNOSIS — G4700 Insomnia, unspecified: Secondary | ICD-10-CM | POA: Diagnosis not present

## 2016-10-24 DIAGNOSIS — I10 Essential (primary) hypertension: Secondary | ICD-10-CM | POA: Diagnosis not present

## 2016-10-24 DIAGNOSIS — E039 Hypothyroidism, unspecified: Secondary | ICD-10-CM | POA: Diagnosis not present

## 2016-11-03 ENCOUNTER — Other Ambulatory Visit: Payer: Self-pay | Admitting: "Endocrinology

## 2016-11-09 ENCOUNTER — Other Ambulatory Visit: Payer: Self-pay | Admitting: "Endocrinology

## 2016-11-13 DIAGNOSIS — M47816 Spondylosis without myelopathy or radiculopathy, lumbar region: Secondary | ICD-10-CM | POA: Diagnosis not present

## 2016-12-08 ENCOUNTER — Other Ambulatory Visit: Payer: Self-pay | Admitting: "Endocrinology

## 2016-12-11 DIAGNOSIS — E039 Hypothyroidism, unspecified: Secondary | ICD-10-CM | POA: Diagnosis not present

## 2016-12-11 DIAGNOSIS — E782 Mixed hyperlipidemia: Secondary | ICD-10-CM | POA: Diagnosis not present

## 2016-12-11 DIAGNOSIS — E1159 Type 2 diabetes mellitus with other circulatory complications: Secondary | ICD-10-CM | POA: Diagnosis not present

## 2016-12-11 DIAGNOSIS — E118 Type 2 diabetes mellitus with unspecified complications: Secondary | ICD-10-CM | POA: Diagnosis not present

## 2016-12-12 LAB — LIPID PANEL
CHOLESTEROL TOTAL: 143 mg/dL (ref 100–199)
Chol/HDL Ratio: 4.5 ratio (ref 0.0–5.0)
HDL: 32 mg/dL — ABNORMAL LOW (ref >39)
LDL Calculated: 59 mg/dL (ref 0–99)
Triglycerides: 259 mg/dL — ABNORMAL HIGH (ref 0–149)
VLDL CHOLESTEROL CAL: 52 mg/dL — AB (ref 5–40)

## 2016-12-12 LAB — CMP14+EGFR
A/G RATIO: 1.9 (ref 1.2–2.2)
ALT: 32 IU/L (ref 0–44)
AST: 25 IU/L (ref 0–40)
Albumin: 4.5 g/dL (ref 3.6–4.8)
Alkaline Phosphatase: 35 IU/L — ABNORMAL LOW (ref 39–117)
BUN/Creatinine Ratio: 19 (ref 10–24)
BUN: 26 mg/dL (ref 8–27)
Bilirubin Total: 0.3 mg/dL (ref 0.0–1.2)
CALCIUM: 10.2 mg/dL (ref 8.6–10.2)
CO2: 28 mmol/L (ref 18–29)
CREATININE: 1.38 mg/dL — AB (ref 0.76–1.27)
Chloride: 98 mmol/L (ref 96–106)
GFR, EST AFRICAN AMERICAN: 64 mL/min/{1.73_m2} (ref >59)
GFR, EST NON AFRICAN AMERICAN: 55 mL/min/{1.73_m2} — AB (ref >59)
Globulin, Total: 2.4 g/dL (ref 1.5–4.5)
Glucose: 154 mg/dL — ABNORMAL HIGH (ref 65–99)
POTASSIUM: 4.8 mmol/L (ref 3.5–5.2)
Sodium: 141 mmol/L (ref 134–144)
TOTAL PROTEIN: 6.9 g/dL (ref 6.0–8.5)

## 2016-12-12 LAB — T4, FREE: FREE T4: 1.15 ng/dL (ref 0.82–1.77)

## 2016-12-12 LAB — TSH: TSH: 2.18 u[IU]/mL (ref 0.450–4.500)

## 2016-12-12 LAB — HEMOGLOBIN A1C
ESTIMATED AVERAGE GLUCOSE: 186 mg/dL
HEMOGLOBIN A1C: 8.1 % — AB (ref 4.8–5.6)

## 2016-12-25 DIAGNOSIS — I10 Essential (primary) hypertension: Secondary | ICD-10-CM | POA: Diagnosis not present

## 2016-12-25 DIAGNOSIS — E039 Hypothyroidism, unspecified: Secondary | ICD-10-CM | POA: Diagnosis not present

## 2016-12-25 DIAGNOSIS — J449 Chronic obstructive pulmonary disease, unspecified: Secondary | ICD-10-CM | POA: Diagnosis not present

## 2016-12-25 DIAGNOSIS — G4700 Insomnia, unspecified: Secondary | ICD-10-CM | POA: Diagnosis not present

## 2017-01-11 DIAGNOSIS — E118 Type 2 diabetes mellitus with unspecified complications: Secondary | ICD-10-CM | POA: Diagnosis not present

## 2017-01-15 ENCOUNTER — Ambulatory Visit (INDEPENDENT_AMBULATORY_CARE_PROVIDER_SITE_OTHER): Payer: Medicare Other | Admitting: "Endocrinology

## 2017-01-15 ENCOUNTER — Other Ambulatory Visit: Payer: Self-pay | Admitting: "Endocrinology

## 2017-01-15 ENCOUNTER — Encounter: Payer: Self-pay | Admitting: "Endocrinology

## 2017-01-15 VITALS — BP 99/62 | HR 93 | Ht 69.0 in | Wt 283.0 lb

## 2017-01-15 DIAGNOSIS — E039 Hypothyroidism, unspecified: Secondary | ICD-10-CM

## 2017-01-15 DIAGNOSIS — E1159 Type 2 diabetes mellitus with other circulatory complications: Secondary | ICD-10-CM

## 2017-01-15 DIAGNOSIS — E6609 Other obesity due to excess calories: Secondary | ICD-10-CM

## 2017-01-15 DIAGNOSIS — E782 Mixed hyperlipidemia: Secondary | ICD-10-CM | POA: Diagnosis not present

## 2017-01-15 DIAGNOSIS — I10 Essential (primary) hypertension: Secondary | ICD-10-CM

## 2017-01-15 DIAGNOSIS — E118 Type 2 diabetes mellitus with unspecified complications: Secondary | ICD-10-CM | POA: Diagnosis not present

## 2017-01-15 DIAGNOSIS — Z6841 Body Mass Index (BMI) 40.0 and over, adult: Secondary | ICD-10-CM

## 2017-01-15 DIAGNOSIS — IMO0001 Reserved for inherently not codable concepts without codable children: Secondary | ICD-10-CM

## 2017-01-15 MED ORDER — INSULIN GLARGINE 100 UNIT/ML SOLOSTAR PEN: 70.0000 [IU] | PEN_INJECTOR | Freq: Every day | SUBCUTANEOUS | 2 refills | Status: DC

## 2017-01-15 MED ORDER — SITAGLIPTIN PHOSPHATE 50 MG PO TABS: 50.0000 mg | ORAL_TABLET | Freq: Every day | ORAL | 3 refills | Status: DC

## 2017-01-15 MED ORDER — SIMVASTATIN 40 MG PO TABS: 40.0000 mg | ORAL_TABLET | Freq: Every day | ORAL | 1 refills | Status: DC

## 2017-01-15 MED ORDER — LEVOTHYROXINE SODIUM 137 MCG PO TABS: 137.0000 ug | ORAL_TABLET | Freq: Every day | ORAL | 3 refills | Status: DC

## 2017-01-15 MED ORDER — INSULIN LISPRO 100 UNIT/ML (KWIKPEN): PEN_INJECTOR | SUBCUTANEOUS | 2 refills | Status: DC

## 2017-01-15 NOTE — Progress Notes (Signed)
Subjective:    Patient ID: [name], male    DOB: [birth year],    Past Medical History:  Diagnosis Date  . ASCVD (arteriosclerotic cardiovascular disease)    -MI in 01/2001 prompted CABG; EF-30% at cath; 50% on echo in 7/02 and normal in 2005  . Cerebrovascular disease     L CEA 06/2004 following left renal embolism; 10/2008 plaque w/o focal stenosis  . Chronic obstructive pulmonary disease ([place])   . Degenerative joint disease     chronic LBP-s/p L3-4 fusion  . Diabetes mellitus    -no insulin  . Hyperlipidemia   . Hypertension   . Hypothyroidism   . Obesity   . Restless leg syndrome    Possible  . Tobacco abuse, in remission    50 pack years; discontinued in 2002   Past Surgical History:  Procedure Laterality Date  . CAROTID ENDARTERECTOMY  2005   Left  . CATARACT EXTRACTION     bilateral  . CORONARY ARTERY BYPASS GRAFT  2002  . LUMBAR SPINE SURGERY     L3-4 fusion   Social History   Social History  . Marital status: Divorced    Spouse name: N/A  . Number of children: 1  . Years of education: N/A   Occupational History  . veteran     disabledf   Social History Main Topics  . Smoking status: Former Smoker    Packs/day: 1.00    Years: 50.00    Types: Cigarettes    Quit date: 01/15/2001  . Smokeless tobacco: Never Used  . Alcohol use No  . Drug use: No  . Sexual activity: Not Asked   Other Topics Concern  . None   Social History Narrative  . None   Outpatient Encounter Prescriptions as of 01/15/2017  Medication Sig  . albuterol-ipratropium (COMBIVENT) 18-103 MCG/ACT inhaler Inhale 2 puffs into the lungs every 6 (six) hours as needed.    Marland Kitchen aspirin 81 MG tablet Take 81 mg by mouth daily.    . B Complex-Biotin-FA (VITAMIN B50 COMPLEX PO) Take by mouth daily.  . Cranberry 400 MG TABS Take by mouth daily.  . diazepam (VALIUM) 10 MG tablet Take 10 mg by mouth every 6 (six) hours as needed.    . docusate sodium (COLACE) 100 MG capsule Take 100 mg by  mouth 2 (two) times daily.    Marland Kitchen escitalopram (LEXAPRO) 20 MG tablet Take 20 mg by mouth daily.    . fenofibrate 160 MG tablet Take 160 mg by mouth daily.    . fish oil-omega-3 fatty acids 1000 MG capsule Take 2 g by mouth daily.  Marland Kitchen HYDROcodone-acetaminophen (NORCO) 10-325 MG per tablet Take 1 tablet by mouth every 6 (six) hours as needed.    . Insulin Glargine (LANTUS SOLOSTAR) 100 UNIT/ML Solostar Pen Inject 70 Units into the skin daily at 10 pm.  . insulin lispro (HUMALOG KWIKPEN) 100 UNIT/ML KiwkPen INJECT 10 -  16 UNITS INTO THE SKIN 3 TIMES DAILY WITH MEALS.  Marland Kitchen levothyroxine (SYNTHROID, LEVOTHROID) 137 MCG tablet Take 1 tablet (137 mcg total) by mouth daily.  . Multiple Vitamins-Minerals (MULTIVITAMIN WITH MINERALS) tablet Take 1 tablet by mouth daily.    . simvastatin (ZOCOR) 40 MG tablet Take 1 tablet (40 mg total) by mouth at bedtime.  . sitaGLIPtin (JANUVIA) 50 MG tablet Take 1 tablet (50 mg total) by mouth daily.  . SURE COMFORT PEN NEEDLES 31G X 8 MM MISC USE AS DIRECTED WITH LANTUS  AND NOVOLOG UP TO FOUR TIMES DAILY.  . vitamin B-12 (CYANOCOBALAMIN) 100 MCG tablet Take 100 mcg by mouth daily.  . [DISCONTINUED] HUMALOG KWIKPEN 100 UNIT/ML KiwkPen INJECT 10 TO 16 UNITS INTO THE SKIN 3 TIMES DAILY WITH MEALS.  . [DISCONTINUED] insulin aspart (NOVOLOG FLEXPEN) 100 UNIT/ML FlexPen Inject 10-16 Units into the skin 3 (three) times daily with meals.  . [DISCONTINUED] LANTUS SOLOSTAR 100 UNIT/ML Solostar Pen INJECT 60 UNITS SUBCUTANEOUSLY AT BEDTIME.  . [DISCONTINUED] levothyroxine (SYNTHROID, LEVOTHROID) 137 MCG tablet Take 1 tablet (137 mcg total) by mouth daily.  . [DISCONTINUED] simvastatin (ZOCOR) 40 MG tablet Take 40 mg by mouth at bedtime.    . [DISCONTINUED] sitaGLIPtin (JANUVIA) 50 MG tablet Take 1 tablet (50 mg total) by mouth daily.   No facility-administered encounter medications on file as of 01/15/2017.    ALLERGIES: Allergies  Allergen Reactions  . Alprazolam   .  Atorvastatin   . Fenofibrate   . Gemfibrozil   . Ibuprofen   . Invokana [Canagliflozin]    VACCINATION STATUS: Immunization History  Administered Date(s) Administered  . Influenza-Unspecified 05/13/2014    Diabetes  He presents for his follow-up diabetic visit. He has type 2 diabetes mellitus. Onset time: He was diagnosed at approximate age of 66 years. His disease course has been stable. There are no hypoglycemic associated symptoms. Pertinent negatives for hypoglycemia include no confusion, headaches, pallor or seizures. Associated symptoms include polydipsia and polyuria. Pertinent negatives for diabetes include no chest pain, no fatigue, no polyphagia and no weakness. There are no hypoglycemic complications. Symptoms are stable. Diabetic complications include a CVA, heart disease and nephropathy. Risk factors for coronary artery disease include diabetes mellitus, dyslipidemia, hypertension, male sex and tobacco exposure. Current diabetic treatment includes insulin injections and oral agent (monotherapy). He is compliant with treatment most of the time. His weight is stable. He is following a generally unhealthy diet. When asked about meal planning, he reported none. He has had a previous visit with a dietitian. He never participates in exercise. There is no change in his home blood glucose trend. His breakfast blood glucose range is generally 140-180 mg/dl. His lunch blood glucose range is generally 140-180 mg/dl. His dinner blood glucose range is generally 140-180 mg/dl. His overall blood glucose range is 140-180 mg/dl. An ACE inhibitor/angiotensin II receptor blocker is being taken. Eye exam is current.  Hyperlipidemia  This is a chronic problem. The current episode started more than 1 year ago. Exacerbating diseases include diabetes and obesity. Pertinent negatives include no chest pain, myalgias or shortness of breath. Current antihyperlipidemic treatment includes statins. Risk factors for  coronary artery disease include family history, dyslipidemia, diabetes mellitus, hypertension, male sex and a sedentary lifestyle.  Hypertension  This is a chronic problem. The current episode started more than 1 year ago. Pertinent negatives include no chest pain, headaches, neck pain, palpitations or shortness of breath. Risk factors for coronary artery disease include diabetes mellitus, obesity, sedentary lifestyle and smoking/tobacco exposure. Past treatments include ACE inhibitors. Hypertensive end-organ damage includes CAD/MI and CVA.      Review of Systems  Constitutional: Negative for chills, fatigue, fever and unexpected weight change.  HENT: Negative for dental problem, mouth sores and trouble swallowing.   Eyes: Negative for visual disturbance.  Respiratory: Negative for cough, choking, chest tightness, shortness of breath and wheezing.   Cardiovascular: Negative for chest pain, palpitations and leg swelling.  Gastrointestinal: Negative for abdominal distention, abdominal pain, constipation, diarrhea, nausea and vomiting.  Endocrine: Positive  for polydipsia and polyuria. Negative for polyphagia.  Genitourinary: Negative for dysuria, flank pain, hematuria and urgency.  Musculoskeletal: Negative for back pain, gait problem, myalgias and neck pain.  Skin: Negative for pallor, rash and wound.  Neurological: Negative for seizures, syncope, weakness, numbness and headaches.  Psychiatric/Behavioral: Negative.  Negative for confusion and dysphoric mood.    Objective:    BP 99/62   Pulse 93   Ht '5\' 9"'  (1.753 m)   Wt 283 lb (128.4 kg)   BMI 41.79 kg/m   Wt Readings from Last 3 Encounters:  01/15/17 283 lb (128.4 kg)  10/11/16 284 lb (128.8 kg)  07/10/16 275 lb (124.7 kg)    Physical Exam  Constitutional: He is oriented to person, place, and time. He appears well-developed and well-nourished. He is cooperative. No distress.  HENT:  Head: Normocephalic and atraumatic.  Eyes: EOM  are normal.  Neck: Normal range of motion. Neck supple. No tracheal deviation present. No thyromegaly present.  Cardiovascular: Normal rate, S1 normal, S2 normal and normal heart sounds.  Exam reveals no gallop.   No murmur heard. Pulses:      Dorsalis pedis pulses are 1+ on the right side, and 1+ on the left side.       Posterior tibial pulses are 1+ on the right side, and 1+ on the left side.  Pulmonary/Chest: Breath sounds normal. No respiratory distress. He has no wheezes.  Abdominal: Soft. Bowel sounds are normal. He exhibits no distension. There is no tenderness. There is no guarding and no CVA tenderness.  Musculoskeletal: He exhibits no edema.       Right shoulder: He exhibits no swelling and no deformity.  Neurological: He is alert and oriented to person, place, and time. He has normal strength and normal reflexes. No cranial nerve deficit or sensory deficit. Gait normal.  Skin: Skin is warm and dry. No rash noted. No cyanosis. Nails show no clubbing.  Psychiatric: He has a normal mood and affect. His speech is normal and behavior is normal. Judgment and thought content normal. Cognition and memory are normal.    Results for orders placed or performed in visit on 10/11/16  Hemoglobin A1c  Result Value Ref Range   Hgb A1c MFr Bld 8.1 (H) 4.8 - 5.6 %   Est. average glucose Bld gHb Est-mCnc 186 mg/dL  Lipid panel  Result Value Ref Range   Cholesterol, Total 143 100 - 199 mg/dL   Triglycerides 259 (H) 0 - 149 mg/dL   HDL 32 (L) >39 mg/dL   VLDL Cholesterol Cal 52 (H) 5 - 40 mg/dL   LDL Calculated 59 0 - 99 mg/dL   Chol/HDL Ratio 4.5 0.0 - 5.0 ratio  TSH  Result Value Ref Range   TSH 2.180 0.450 - 4.500 uIU/mL  T4, free  Result Value Ref Range   Free T4 1.15 0.82 - 1.77 ng/dL  CMP14+EGFR  Result Value Ref Range   Glucose 154 (H) 65 - 99 mg/dL   BUN 26 8 - 27 mg/dL   Creatinine, Ser 1.38 (H) 0.76 - 1.27 mg/dL   GFR calc non Af Amer 55 (L) >59 mL/min/1.73   GFR calc Af Amer  64 >59 mL/min/1.73   BUN/Creatinine Ratio 19 10 - 24   Sodium 141 134 - 144 mmol/L   Potassium 4.8 3.5 - 5.2 mmol/L   Chloride 98 96 - 106 mmol/L   CO2 28 18 - 29 mmol/L   Calcium 10.2 8.6 - 10.2 mg/dL  Total Protein 6.9 6.0 - 8.5 g/dL   Albumin 4.5 3.6 - 4.8 g/dL   Globulin, Total 2.4 1.5 - 4.5 g/dL   Albumin/Globulin Ratio 1.9 1.2 - 2.2   Bilirubin Total 0.3 0.0 - 1.2 mg/dL   Alkaline Phosphatase 35 (L) 39 - 117 IU/L   AST 25 0 - 40 IU/L   ALT 32 0 - 44 IU/L   Lab Results  Component Value Date   WBC 9.1 09/27/2010   HGB 13.2 09/27/2010   HCT 42.7 09/27/2010   MCV 95.1 09/27/2010   PLT 174 09/27/2010    Lab Results  Component Value Date   HGBA1C 8.1 (H) 12/11/2016   HGBA1C 8.0 (H) 10/08/2016   HGBA1C 10.4 (H) 06/11/2016     Lipid Panel     Component Value Date/Time   CHOL 143 12/11/2016 0942   TRIG 259 (H) 12/11/2016 0942   TRIG 329 10/02/2007   HDL 32 (L) 12/11/2016 0942   CHOLHDL 4.5 12/11/2016 0942   CHOLHDL 3.4 Ratio 12/26/2009 1848   VLDL 39 12/26/2009 1848   LDLCALC 59 12/11/2016 0942   LDLCALC 66 10/02/2007     Assessment & Plan:   1. Type 2 diabetes mellitus with stage 3 chronic kidney disease, with long-term current use of insulin (HCC)  His diabetes is  complicated by  CAD, CKD.CVA, Retinopathy. his renal function is improving.  Patient came with Continued improvement in his glucose profile,  A1c Stable at 8.1%, slowly improving from  10.4%.     Glucose logs and insulin administration records pertaining to this visit,  to be scanned into patient's records.  Recent labs reviewed.  - Patient remains at a high risk for more acute and chronic complications of diabetes which include CAD, CVA, CKD, retinopathy, and neuropathy. These are all discussed in detail with the patient.  - I have re-counseled the patient on diet management and weight loss  by adopting a carbohydrate restricted / protein rich  Diet. - Patient is advised to stick to a routine  mealtimes to eat 3 meals  a day and avoid unnecessary snacks ( to snack only to correct hypoglycemia).  - Suggestion is made for patient to avoid simple carbohydrates   from his diet including Cakes , Desserts, Ice Cream,  Soda (  diet and regular) , Sweet Tea , Candies,  Chips, Cookies, Artificial Sweeteners,   and "Sugar-free" Products .  This will help patient to have stable blood glucose profile and potentially avoid unintended  Weight gain.  - The patient  Has been   scheduled with Jearld Fenton, RDN, CDE for individualized DM education. - I have approached patient with the following individualized plan to manage diabetes and patient agrees.  -  He will continue to require basal/bolus insulin . -   I will  increase his basal insulin Lantus to 70 units daily at bedtime, continue Humalog  10 units 3 times a day before meals for pre-meal blood glucose above 90 mg/dL plus correction associated with monitoring of glucose 4 times daily- before meals and at bedtime. -Patient is encouraged to call clinic for blood glucose levels less than 70 or above 300 mg /dl.  -Due to CKD patient is not a candidate for MTF, SGLT2i. -I advised him to continue Januvia 50 mg po qday. -He  does not tolerate metformin, and not appropriate candidate due to CK D.  -Target numbers for A1c, LDL, HDL, Triglycerides, Waist Circumference were discussed in detail.   2) BP/HTN:  Much better blood pressure today.  Continue current medications including ACEI. 3) Lipids/HPL: Recent LDL at target levels 80, continue statins. 4)  Weight/Diet: CDE consult in progress, exercise, and carbohydrates information provided.  5) Hypothyroidism:  is now compliant with his thyroid hormone. - Based on his thyroid function tests he is now being appropriately replaced. I will continue levothyroxine at 137 g by mouth every morning.   - We discussed about correct intake of levothyroxine, at fasting, with water, separated by at least 30  minutes from breakfast, and separated by more than 4 hours from calcium, iron, multivitamins, acid reflux medications (PPIs). -Patient is made aware of the fact that thyroid hormone replacement is needed for life, dose to be adjusted by periodic monitoring of thyroid function tests.  6) Chronic Care/Health Maintenance:  -Patient  on ACEI and Statin medications and encouraged to continue to follow up with Ophthalmology, Podiatrist at least yearly or according to recommendations, and advised to  stay away from smoking. I have recommended yearly flu vaccine and pneumonia vaccination at least every 5 years; moderate intensity exercise for up to 150 minutes weekly; and  sleep for at least 7 hours a day.  I advised patient to maintain close follow up with their PCP for primary care needs.  Patient is asked to bring meter and  blood glucose logs during his next visit.   Follow up plan: Return in about 3 months (around 04/17/2017) for follow up with pre-visit labs, meter, and logs.  Glade Lloyd, MD Phone: [phone]  Fax: [phone]   01/15/2017, 1:34 PM

## 2017-01-15 NOTE — Patient Instructions (Signed)
Advice for weight management -For most of us the best way to lose weight is by diet management. Generally speaking, diet management means restricting carbohydrate consumption to minimum possible (and to unprocessed or minimally processed complex starch) and increasing protein intake (animal or plant source), fruits, and vegetables.  -Sticking to a routine mealtime to eat 3 meals a day and avoiding unnecessary snacks is shown to have a big role in weight control.  -It is better to avoid simple carbohydrates including: Cakes, Desserts, Ice Cream, Soda (diet and regular), Sweet Tea, Candies, Chips, Cookies, Artificial Sweeteners, and "Sugar-free" Products.   -Exercise: 30 minutes a day 3-4 days a week, or 150 minutes a week. Combine stretch, strength, and aerobic activities. You may seek evaluation by your heart doctor prior to initiating exercise if you have high risk for heart disease.  -If you are interested, we can schedule a visit with [name], RDN, CDE for individualized nutrition education.  

## 2017-01-22 ENCOUNTER — Other Ambulatory Visit: Payer: Self-pay | Admitting: "Endocrinology

## 2017-01-22 DIAGNOSIS — E1122 Type 2 diabetes mellitus with diabetic chronic kidney disease: Secondary | ICD-10-CM | POA: Diagnosis not present

## 2017-01-22 DIAGNOSIS — J449 Chronic obstructive pulmonary disease, unspecified: Secondary | ICD-10-CM | POA: Diagnosis not present

## 2017-01-26 DIAGNOSIS — Z1211 Encounter for screening for malignant neoplasm of colon: Secondary | ICD-10-CM | POA: Diagnosis not present

## 2017-03-05 DIAGNOSIS — M47816 Spondylosis without myelopathy or radiculopathy, lumbar region: Secondary | ICD-10-CM | POA: Diagnosis not present

## 2017-03-14 ENCOUNTER — Other Ambulatory Visit: Payer: Self-pay | Admitting: "Endocrinology

## 2017-04-09 ENCOUNTER — Other Ambulatory Visit: Payer: Self-pay | Admitting: "Endocrinology

## 2017-04-09 DIAGNOSIS — E118 Type 2 diabetes mellitus with unspecified complications: Secondary | ICD-10-CM | POA: Diagnosis not present

## 2017-04-09 DIAGNOSIS — E1159 Type 2 diabetes mellitus with other circulatory complications: Secondary | ICD-10-CM | POA: Diagnosis not present

## 2017-04-10 LAB — RENAL FUNCTION PANEL
Albumin: 4.5 g/dL (ref 3.6–4.8)
BUN / CREAT RATIO: 19 (ref 10–24)
BUN: 23 mg/dL (ref 8–27)
CHLORIDE: 101 mmol/L (ref 96–106)
CO2: 26 mmol/L (ref 20–29)
Calcium: 9.5 mg/dL (ref 8.6–10.2)
Creatinine, Ser: 1.19 mg/dL (ref 0.76–1.27)
GFR, EST AFRICAN AMERICAN: 76 mL/min/{1.73_m2} (ref >59)
GFR, EST NON AFRICAN AMERICAN: 66 mL/min/{1.73_m2} (ref >59)
GLUCOSE: 114 mg/dL — AB (ref 65–99)
POTASSIUM: 4.8 mmol/L (ref 3.5–5.2)
Phosphorus: 2.3 mg/dL — ABNORMAL LOW (ref 2.5–4.5)
Sodium: 143 mmol/L (ref 134–144)

## 2017-04-10 LAB — HGB A1C W/O EAG: Hgb A1c MFr Bld: 6.7 % — ABNORMAL HIGH (ref 4.8–5.6)

## 2017-04-10 LAB — AMBIG ABBREV

## 2017-04-12 ENCOUNTER — Other Ambulatory Visit: Payer: Self-pay | Admitting: "Endocrinology

## 2017-04-15 ENCOUNTER — Other Ambulatory Visit: Payer: Self-pay | Admitting: "Endocrinology

## 2017-04-17 ENCOUNTER — Encounter: Payer: Self-pay | Admitting: "Endocrinology

## 2017-04-17 ENCOUNTER — Ambulatory Visit (INDEPENDENT_AMBULATORY_CARE_PROVIDER_SITE_OTHER): Payer: Medicare Other | Admitting: "Endocrinology

## 2017-04-17 VITALS — BP 117/69 | HR 87 | Ht 69.0 in | Wt 274.0 lb

## 2017-04-17 DIAGNOSIS — I10 Essential (primary) hypertension: Secondary | ICD-10-CM

## 2017-04-17 DIAGNOSIS — IMO0001 Reserved for inherently not codable concepts without codable children: Secondary | ICD-10-CM

## 2017-04-17 DIAGNOSIS — E039 Hypothyroidism, unspecified: Secondary | ICD-10-CM | POA: Diagnosis not present

## 2017-04-17 DIAGNOSIS — E782 Mixed hyperlipidemia: Secondary | ICD-10-CM

## 2017-04-17 DIAGNOSIS — E1159 Type 2 diabetes mellitus with other circulatory complications: Secondary | ICD-10-CM | POA: Diagnosis not present

## 2017-04-17 DIAGNOSIS — E6609 Other obesity due to excess calories: Secondary | ICD-10-CM

## 2017-04-17 DIAGNOSIS — Z6841 Body Mass Index (BMI) 40.0 and over, adult: Secondary | ICD-10-CM | POA: Diagnosis not present

## 2017-04-17 DIAGNOSIS — E118 Type 2 diabetes mellitus with unspecified complications: Secondary | ICD-10-CM | POA: Diagnosis not present

## 2017-04-17 MED ORDER — FREESTYLE LIBRE READER DEVI: 1.0000 | Freq: Once | 0 refills | Status: DC

## 2017-04-17 MED ORDER — FREESTYLE LIBRE SENSOR SYSTEM MISC
2 refills | Status: DC
Start: 2017-04-17 — End: 2018-07-03

## 2017-04-17 MED ORDER — FREESTYLE LIBRE SENSOR SYSTEM MISC: 2 refills | Status: DC

## 2017-04-17 MED ORDER — FREESTYLE LIBRE READER DEVI: 1.0000 | Freq: Once | 0 refills | Status: AC

## 2017-04-17 NOTE — Progress Notes (Signed)
Subjective:    Patient ID: [name], male    DOB: [birth year],    Past Medical History:  Diagnosis Date  . ASCVD (arteriosclerotic cardiovascular disease)    -MI in 01/2001 prompted CABG; EF-30% at cath; 50% on echo in 7/02 and normal in 2005  . Cerebrovascular disease     L CEA 06/2004 following left renal embolism; 10/2008 plaque w/o focal stenosis  . Chronic obstructive pulmonary disease (HCC)   . Degenerative joint disease     chronic LBP-s/p L3-4 fusion  . Diabetes mellitus    -no insulin  . Hyperlipidemia   . Hypertension   . Hypothyroidism   . Obesity   . Restless leg syndrome    Possible  . Tobacco abuse, in remission    50 pack years; discontinued in 2002   Past Surgical History:  Procedure Laterality Date  . CAROTID ENDARTERECTOMY  2005   Left  . CATARACT EXTRACTION     bilateral  . CORONARY ARTERY BYPASS GRAFT  2002  . LUMBAR SPINE SURGERY     L3-4 fusion   Social History   Social History  . Marital status: Divorced    Spouse name: N/A  . Number of children: 1  . Years of education: N/A   Occupational History  . veteran     disabledf   Social History Main Topics  . Smoking status: Former Smoker    Packs/day: 1.00    Years: 50.00    Types: Cigarettes    Quit date: 01/15/2001  . Smokeless tobacco: Never Used  . Alcohol use No  . Drug use: No  . Sexual activity: Not Asked   Other Topics Concern  . None   Social History Narrative  . None   Outpatient Encounter Prescriptions as of 04/17/2017  Medication Sig  . albuterol-ipratropium (COMBIVENT) 18-103 MCG/ACT inhaler Inhale 2 puffs into the lungs every 6 (six) hours as needed.    Marland Kitchen aspirin 81 MG tablet Take 81 mg by mouth daily.    . B Complex-Biotin-FA (VITAMIN B50 COMPLEX PO) Take by mouth daily.  . Continuous Blood Gluc Receiver (FREESTYLE LIBRE READER) DEVI 1 Piece by Does not apply route once.  . Continuous Blood Gluc Sensor (FREESTYLE LIBRE SENSOR SYSTEM) MISC Use one sensor every  10 days.  . Cranberry 400 MG TABS Take by mouth daily.  . diazepam (VALIUM) 10 MG tablet Take 10 mg by mouth every 6 (six) hours as needed.    . docusate sodium (COLACE) 100 MG capsule Take 100 mg by mouth 2 (two) times daily.    Marland Kitchen escitalopram (LEXAPRO) 20 MG tablet Take 20 mg by mouth daily.    . fenofibrate 160 MG tablet Take 160 mg by mouth daily.    . fish oil-omega-3 fatty acids 1000 MG capsule Take 2 g by mouth daily.  Marland Kitchen HYDROcodone-acetaminophen (NORCO) 10-325 MG per tablet Take 1 tablet by mouth every 6 (six) hours as needed.    . Insulin Glargine (LANTUS SOLOSTAR) 100 UNIT/ML Solostar Pen Inject 70 Units into the skin at bedtime.  . insulin lispro (HUMALOG KWIKPEN) 100 UNIT/ML KiwkPen INJECT 10 -  16 UNITS INTO THE SKIN 3 TIMES DAILY WITH MEALS.  Marland Kitchen JANUVIA 50 MG tablet TAKE 1 TABLET BY MOUTH DAILY.  Marland Kitchen levothyroxine (SYNTHROID, LEVOTHROID) 137 MCG tablet TAKE 1 TABLET BY MOUTH ONCE DAILY FOR THYROID.  . Multiple Vitamins-Minerals (MULTIVITAMIN WITH MINERALS) tablet Take 1 tablet by mouth daily.    . simvastatin (ZOCOR)  40 MG tablet Take 1 tablet (40 mg total) by mouth at bedtime.  . SURE COMFORT PEN NEEDLES 31G X 8 MM MISC USE AS DIRECTED WITH LANTUS AND NOVOLOG UP TO FOUR TIMES DAILY.  . vitamin B-12 (CYANOCOBALAMIN) 100 MCG tablet Take 100 mcg by mouth daily.  . [DISCONTINUED] Continuous Blood Gluc Receiver (FREESTYLE LIBRE READER) DEVI 1 Piece by Does not apply route once.  . [DISCONTINUED] Continuous Blood Gluc Sensor (FREESTYLE LIBRE SENSOR SYSTEM) MISC Use one sensor every 10 days.   No facility-administered encounter medications on file as of 04/17/2017.    ALLERGIES: Allergies  Allergen Reactions  . Alprazolam   . Atorvastatin   . Fenofibrate   . Gemfibrozil   . Ibuprofen   . Invokana [Canagliflozin]    VACCINATION STATUS: Immunization History  Administered Date(s) Administered  . Influenza-Unspecified 05/13/2014    Diabetes  He presents for his follow-up diabetic  visit. He has type 2 diabetes mellitus. Onset time: He was diagnosed at approximate age of 50 years. His disease course has been improving. There are no hypoglycemic associated symptoms. Pertinent negatives for hypoglycemia include no confusion, headaches, pallor or seizures. Pertinent negatives for diabetes include no chest pain, no fatigue, no polydipsia, no polyphagia, no polyuria and no weakness. There are no hypoglycemic complications. Symptoms are improving. Diabetic complications include a CVA, heart disease and nephropathy. Risk factors for coronary artery disease include diabetes mellitus, dyslipidemia, hypertension, male sex and tobacco exposure. Current diabetic treatment includes insulin injections and oral agent (monotherapy). He is compliant with treatment most of the time. His weight is decreasing steadily. He is following a generally unhealthy diet. When asked about meal planning, he reported none. He has had a previous visit with a dietitian. He never participates in exercise. There is no change in his home blood glucose trend. His breakfast blood glucose range is generally 130-140 mg/dl. His lunch blood glucose range is generally 130-140 mg/dl. His dinner blood glucose range is generally 130-140 mg/dl. His overall blood glucose range is 130-140 mg/dl. An ACE inhibitor/angiotensin II receptor blocker is being taken. Eye exam is current.  Hyperlipidemia  This is a chronic problem. The current episode started more than 1 year ago. Exacerbating diseases include diabetes and obesity. Pertinent negatives include no chest pain, myalgias or shortness of breath. Current antihyperlipidemic treatment includes statins. Risk factors for coronary artery disease include family history, dyslipidemia, diabetes mellitus, hypertension, male sex and a sedentary lifestyle.  Hypertension  This is a chronic problem. The current episode started more than 1 year ago. Pertinent negatives include no chest pain,  headaches, neck pain, palpitations or shortness of breath. Risk factors for coronary artery disease include diabetes mellitus, obesity, sedentary lifestyle and smoking/tobacco exposure. Past treatments include ACE inhibitors. Hypertensive end-organ damage includes CAD/MI and CVA.    Review of Systems  Constitutional: Negative for chills, fatigue, fever and unexpected weight change.  HENT: Negative for dental problem, mouth sores and trouble swallowing.   Eyes: Negative for visual disturbance.  Respiratory: Negative for cough, choking, chest tightness, shortness of breath and wheezing.   Cardiovascular: Negative for chest pain, palpitations and leg swelling.  Gastrointestinal: Negative for abdominal distention, abdominal pain, constipation, diarrhea, nausea and vomiting.  Endocrine: Negative for polydipsia, polyphagia and polyuria.  Genitourinary: Negative for dysuria, flank pain, hematuria and urgency.  Musculoskeletal: Negative for back pain, gait problem, myalgias and neck pain.  Skin: Negative for pallor, rash and wound.  Neurological: Negative for seizures, syncope, weakness, numbness and headaches.  Psychiatric/Behavioral:  Negative.  Negative for confusion and dysphoric mood.    Objective:    BP 117/69   Pulse 87   Ht 5\' 9"  (1.753 m)   Wt 274 lb (124.3 kg)   BMI 40.46 kg/m   Wt Readings from Last 3 Encounters:  04/17/17 274 lb (124.3 kg)  01/15/17 283 lb (128.4 kg)  10/11/16 284 lb (128.8 kg)    Physical Exam  Constitutional: He is oriented to person, place, and time. He appears well-developed and well-nourished. He is cooperative. No distress.  HENT:  Head: Normocephalic and atraumatic.  Eyes: EOM are normal.  Neck: Normal range of motion. Neck supple. No tracheal deviation present. No thyromegaly present.  Cardiovascular: Normal rate, S1 normal, S2 normal and normal heart sounds.  Exam reveals no gallop.   No murmur heard. Pulses:      Dorsalis pedis pulses are 1+ on  the right side, and 1+ on the left side.       Posterior tibial pulses are 1+ on the right side, and 1+ on the left side.  Pulmonary/Chest: Breath sounds normal. No respiratory distress. He has no wheezes.  Abdominal: Soft. Bowel sounds are normal. He exhibits no distension. There is no tenderness. There is no guarding and no CVA tenderness.  Musculoskeletal: He exhibits no edema.       Right shoulder: He exhibits no swelling and no deformity.  Neurological: He is alert and oriented to person, place, and time. He has normal strength and normal reflexes. No cranial nerve deficit or sensory deficit. Gait normal.  Skin: Skin is warm and dry. No rash noted. No cyanosis. Nails show no clubbing.  Psychiatric: He has a normal mood and affect. His speech is normal and behavior is normal. Judgment and thought content normal. Cognition and memory are normal.    Results for orders placed or performed in visit on 04/09/17  Renal function panel  Result Value Ref Range   Glucose 114 (H) 65 - 99 mg/dL   BUN 23 8 - 27 mg/dL   Creatinine, Ser 1.61 0.76 - 1.27 mg/dL   GFR calc non Af Amer 66 >59 mL/min/1.73   GFR calc Af Amer 76 >59 mL/min/1.73   BUN/Creatinine Ratio 19 10 - 24   Sodium 143 134 - 144 mmol/L   Potassium 4.8 3.5 - 5.2 mmol/L   Chloride 101 96 - 106 mmol/L   CO2 26 20 - 29 mmol/L   Calcium 9.5 8.6 - 10.2 mg/dL   Phosphorus 2.3 (L) 2.5 - 4.5 mg/dL   Albumin 4.5 3.6 - 4.8 g/dL  Hgb W9U w/o eAG  Result Value Ref Range   Hgb A1c MFr Bld 6.7 (H) 4.8 - 5.6 %  Ambig Abbrev  Result Value Ref Range   Ambig Abbrev Comment    Lab Results  Component Value Date   WBC 9.1 09/27/2010   HGB 13.2 09/27/2010   HCT 42.7 09/27/2010   MCV 95.1 09/27/2010   PLT 174 09/27/2010    Lab Results  Component Value Date   HGBA1C 6.7 (H) 04/09/2017   HGBA1C 8.1 (H) 12/11/2016   HGBA1C 8.0 (H) 10/08/2016     Lipid Panel     Component Value Date/Time   CHOL 143 12/11/2016 0942   TRIG 259 (H)  12/11/2016 0942   TRIG 329 10/02/2007   HDL 32 (L) 12/11/2016 0942   CHOLHDL 4.5 12/11/2016 0942   CHOLHDL 3.4 Ratio 12/26/2009 1848   VLDL 39 12/26/2009 1848   LDLCALC 59  12/11/2016 0942   LDLCALC 66 10/02/2007     Assessment & Plan:   1. Type 2 diabetes mellitus with stage 3 chronic kidney disease, with long-term current use of insulin (HCC)  His diabetes is  complicated by  CAD, CKD.CVA, Retinopathy. his renal function is improving.  Patient came with Continued improvement in his glucose profile,  A1c improving to 6.7%, slowly improving from  10.4%.     Glucose logs and insulin administration records pertaining to this visit,  to be scanned into patient's records.  Recent labs reviewed.  - Patient remains at a high risk for more acute and chronic complications of diabetes which include CAD, CVA, CKD, retinopathy, and neuropathy. These are all discussed in detail with the patient.  - I have re-counseled the patient on diet management and weight loss  by adopting a carbohydrate restricted / protein rich  Diet. - Patient is advised to stick to a routine mealtimes to eat 3 meals  a day and avoid unnecessary snacks ( to snack only to correct hypoglycemia).  - Suggestion is made for him to avoid simple carbohydrates  from his diet including Cakes, Sweet Desserts, Ice Cream, Soda (diet and regular), Sweet Tea, Candies, Chips, Cookies, Store Bought Juices, Alcohol in Excess of  1-2 drinks a day, Artificial Sweeteners, and "Sugar-free" Products. This will help patient to have stable blood glucose profile and potentially avoid unintended weight gain.  - I have approached patient with the following individualized plan to manage diabetes and patient agrees.  -  He will continue to require basal/bolus insulin . -   I will continue his basal insulin Lantus to 70 units daily at bedtime, continue Humalog  10 units 3 times a day before meals for pre-meal blood glucose above 90 mg/dL plus correction  associated with monitoring of glucose 4 times daily- before meals and at bedtime. - He'll benefit from continuous glucose monitoring. I have discussed and initiated a prescription for the Freestyle libre device. -Patient is encouraged to call clinic for blood glucose levels less than 70 or above 300 mg /dl.  -Due to CKD patient is not a candidate for metformin , SGLT2i. -I advised him to continue Januvia 50 mg po qday. -He  does not tolerate metformin, and not appropriate candidate due to CK D.  -Target numbers for A1c, LDL, HDL, Triglycerides, Waist Circumference were discussed in detail.   2) BP/HTN: Continued to improve to 117/69. Continue current medications including ACEI. 3) Lipids/HPL: Recent LDL at target levels 80, continue statins. 4)  Weight/Diet: CDE consult in progress, exercise, and carbohydrates information provided.  5) Hypothyroidism:  is now compliant with his thyroid hormone. - Based on his thyroid function tests he is now being appropriately replaced. I will continue levothyroxine at 137 g by mouth every morning.   - We discussed about correct intake of levothyroxine, at fasting, with water, separated by at least 30 minutes from breakfast, and separated by more than 4 hours from calcium, iron, multivitamins, acid reflux medications (PPIs). -Patient is made aware of the fact that thyroid hormone replacement is needed for life, dose to be adjusted by periodic monitoring of thyroid function tests.  6) Chronic Care/Health Maintenance:  -Patient  on ACEI and Statin medications and encouraged to continue to follow up with Ophthalmology, Podiatrist at least yearly or according to recommendations, and advised to  stay away from smoking. I have recommended yearly flu vaccine and pneumonia vaccination at least every 5 years; moderate intensity exercise for up  to 150 minutes weekly; and  sleep for at least 7 hours a day.  I advised patient to maintain close follow up with his PCP  for primary care needs.  Patient is asked to bring meter and  blood glucose logs during his next visit.  - Time spent with the patient: 25 min, of which >50% was spent in reviewing his sugar logs , discussing his hypo- and hyper-glycemic episodes, reviewing his current and  previous labs and insulin doses and developing a plan to avoid hypo- and hyper-glycemia.   Follow up plan: No Follow-up on file.  Marquis LunchGebre Greogory Cornette, MD Phone: [phone]  Fax: [phone]  This note was partially dictated with voice recognition software. Similar sounding words can be transcribed inadequately or may not  be corrected upon review.  04/17/2017, 11:38 AM

## 2017-04-17 NOTE — Patient Instructions (Signed)

## 2017-05-09 ENCOUNTER — Other Ambulatory Visit: Payer: Self-pay | Admitting: "Endocrinology

## 2017-05-23 ENCOUNTER — Other Ambulatory Visit: Payer: Self-pay | Admitting: "Endocrinology

## 2017-05-29 DIAGNOSIS — Z23 Encounter for immunization: Secondary | ICD-10-CM | POA: Diagnosis not present

## 2017-06-11 DIAGNOSIS — M47816 Spondylosis without myelopathy or radiculopathy, lumbar region: Secondary | ICD-10-CM | POA: Diagnosis not present

## 2017-06-20 DIAGNOSIS — Z1389 Encounter for screening for other disorder: Secondary | ICD-10-CM | POA: Diagnosis not present

## 2017-06-20 DIAGNOSIS — N342 Other urethritis: Secondary | ICD-10-CM | POA: Diagnosis not present

## 2017-07-01 ENCOUNTER — Other Ambulatory Visit: Payer: Self-pay | Admitting: "Endocrinology

## 2017-07-07 ENCOUNTER — Emergency Department (HOSPITAL_COMMUNITY): Payer: Medicare Other

## 2017-07-07 ENCOUNTER — Emergency Department (HOSPITAL_COMMUNITY)
Admission: EM | Admit: 2017-07-07 | Discharge: 2017-07-07 | Disposition: A | Discharge status: Home / Self Care | Payer: Medicare Other | Attending: Emergency Medicine | Admitting: Emergency Medicine

## 2017-07-07 ENCOUNTER — Other Ambulatory Visit: Payer: Self-pay

## 2017-07-07 ENCOUNTER — Encounter (HOSPITAL_COMMUNITY): Payer: Self-pay | Admitting: Emergency Medicine

## 2017-07-07 DIAGNOSIS — E039 Hypothyroidism, unspecified: Secondary | ICD-10-CM | POA: Diagnosis not present

## 2017-07-07 DIAGNOSIS — Z7982 Long term (current) use of aspirin: Secondary | ICD-10-CM | POA: Diagnosis not present

## 2017-07-07 DIAGNOSIS — J449 Chronic obstructive pulmonary disease, unspecified: Secondary | ICD-10-CM | POA: Insufficient documentation

## 2017-07-07 DIAGNOSIS — N183 Chronic kidney disease, stage 3 (moderate): Secondary | ICD-10-CM | POA: Diagnosis not present

## 2017-07-07 DIAGNOSIS — E1122 Type 2 diabetes mellitus with diabetic chronic kidney disease: Secondary | ICD-10-CM | POA: Insufficient documentation

## 2017-07-07 DIAGNOSIS — M1612 Unilateral primary osteoarthritis, left hip: Secondary | ICD-10-CM | POA: Diagnosis not present

## 2017-07-07 DIAGNOSIS — Z79899 Other long term (current) drug therapy: Secondary | ICD-10-CM | POA: Insufficient documentation

## 2017-07-07 DIAGNOSIS — M545 Low back pain: Secondary | ICD-10-CM | POA: Insufficient documentation

## 2017-07-07 DIAGNOSIS — M25552 Pain in left hip: Secondary | ICD-10-CM

## 2017-07-07 DIAGNOSIS — G8929 Other chronic pain: Secondary | ICD-10-CM | POA: Insufficient documentation

## 2017-07-07 DIAGNOSIS — Z87891 Personal history of nicotine dependence: Secondary | ICD-10-CM | POA: Insufficient documentation

## 2017-07-07 DIAGNOSIS — M25562 Pain in left knee: Secondary | ICD-10-CM | POA: Diagnosis not present

## 2017-07-07 DIAGNOSIS — Z794 Long term (current) use of insulin: Secondary | ICD-10-CM | POA: Diagnosis not present

## 2017-07-07 DIAGNOSIS — I129 Hypertensive chronic kidney disease with stage 1 through stage 4 chronic kidney disease, or unspecified chronic kidney disease: Secondary | ICD-10-CM | POA: Diagnosis not present

## 2017-07-07 HISTORY — DX: Dorsalgia, unspecified: M54.9

## 2017-07-07 HISTORY — DX: Other chronic pain: G89.29

## 2017-07-07 HISTORY — DX: Pain in leg, unspecified: M79.606

## 2017-07-07 MED ORDER — OXYCODONE-ACETAMINOPHEN 5-325 MG PO TABS
1.0000 | ORAL_TABLET | Freq: Once | ORAL | Status: AC
  Administered 2017-07-07: 1 via ORAL
  Filled 2017-07-07: qty 1

## 2017-07-07 NOTE — Discharge Instructions (Signed)
Take your usual prescriptions as previously directed.  Apply moist heat or ice to the area(s) of discomfort, for 15 minutes at a time, several times per day for the next few days.  Do not fall asleep on a heating or ice pack.  Call your regular medical doctor and your Neurosurgeon on Monday to schedule a follow up appointment this week.  Return to the Emergency Department immediately if worsening.

## 2017-07-07 NOTE — ED Triage Notes (Signed)
Reports of left hip pain that radiates to left knee.  Increased pain when ambulating or changing positions.  No known injury.

## 2017-07-07 NOTE — ED Provider Notes (Signed)
[facility] EMERGENCY DEPARTMENT Provider Note   CSN: [id number] Arrival date & time: 07/07/17  1555     History   Chief Complaint Chief Complaint  Patient presents with  . Leg Pain    HPI [name] is a 61 y.o. male.  HPI  Pt was seen at 1655. Per pt, c/o gradual onset and persistence of constant acute flair of his chronic low back "pain" for the past several months.  Denies any change in his usual chronic pain pattern.  Pain worsens with palpation of the area and body position changes. Pt states he also has been having pain in his left hip area that radiates into his knee. Pt states he has been evaluated by his Neurosurgeon Dr. Channing Mutters for his complaints but "can't remember what he said." Pt states he "only takes extra strength tylenol for pain."  Denies incont/retention of bowel or bladder, no saddle anesthesia, no focal motor weakness, no tingling/numbness in extremities, no fevers, no injury, no abd pain.      Past Medical History:  Diagnosis Date  . ASCVD (arteriosclerotic cardiovascular disease)    -MI in 01/2001 prompted CABG; EF-30% at cath; 50% on echo in 7/02 and normal in 2005  . Cerebrovascular disease     L CEA 06/2004 following left renal embolism; 10/2008 plaque w/o focal stenosis  . Chronic back pain   . Chronic leg pain   . Chronic obstructive pulmonary disease (HCC)   . Degenerative joint disease     chronic LBP-s/p L3-4 fusion  . Diabetes mellitus    -no insulin  . Hyperlipidemia   . Hypertension   . Hypothyroidism   . Obesity   . Restless leg syndrome    Possible  . Tobacco abuse, in remission    50 pack years; discontinued in 2002    Patient Active Problem List   Diagnosis Date Noted  . Type 2 diabetes mellitus with vascular disease (HCC) 06/03/2015  . ASCVD (arteriosclerotic cardiovascular disease)   . Cerebrovascular disease   . Mixed hyperlipidemia   . Tobacco abuse, in remission   . Hypothyroidism   . Class 3 obesity due to excess  calories with serious comorbidity and body mass index (BMI) of 40.0 to 44.9 in adult   . Degenerative joint disease   . Hypertension   . Chronic obstructive pulmonary disease (HCC) 12/18/2009  . Type 2 diabetes mellitus with stage 3 chronic kidney disease (HCC) 02/11/2009    Past Surgical History:  Procedure Laterality Date  . CAROTID ENDARTERECTOMY  2005   Left  . CATARACT EXTRACTION     bilateral  . CORONARY ARTERY BYPASS GRAFT  2002  . LUMBAR SPINE SURGERY     L3-4 fusion       Home Medications    Prior to Admission medications   Medication Sig Start Date End Date Taking? Authorizing Provider  Acetaminophen (TYLENOL ARTHRITIS EXT RELIEF PO) Take 1-2 tablets by mouth daily as needed (for pain).   Yes [provider]  albuterol-ipratropium (COMBIVENT) 18-103 MCG/ACT inhaler Inhale 2 puffs into the lungs every 6 (six) hours as needed for wheezing or shortness of breath.    Yes [provider]  aspirin 81 MG tablet Take 81 mg by mouth daily.     Yes [provider]  B Complex-Biotin-FA (VITAMIN B50 COMPLEX PO) Take by mouth daily.   Yes [provider]  Cholecalciferol (VITAMIN D PO) Take 1 capsule by mouth daily.   Yes [provider]  diazepam (VALIUM) 10 MG tablet Take 10 mg by mouth 2 (two) times daily as needed for anxiety.    Yes [provider]  docusate sodium (COLACE) 100 MG capsule Take 100 mg by mouth 2 (two) times daily.     Yes [provider]  escitalopram (LEXAPRO) 20 MG tablet Take 20 mg by mouth daily.     Yes [provider]  fenofibrate 160 MG tablet Take 160 mg by mouth daily.     Yes [provider]  fish oil-omega-3 fatty acids 1000 MG capsule Take 2 g by mouth daily.   Yes [provider]  HYDROcodone-acetaminophen (NORCO) 10-325 MG per tablet Take 1 tablet by mouth every 6 (six) hours as needed.     Yes [provider]  Insulin Glargine (LANTUS SOLOSTAR) 100  UNIT/ML Solostar Pen Inject 70 Units into the skin at bedtime. 03/15/17  Yes Nida, Denman GeorgeGebreselassie W, MD  insulin lispro (HUMALOG KWIKPEN) 100 UNIT/ML KiwkPen INJECT 10 -  16 UNITS INTO THE SKIN 3 TIMES DAILY WITH MEALS. 01/15/17  Yes Nida, Denman GeorgeGebreselassie W, MD  JANUVIA 50 MG tablet TAKE 1 TABLET BY MOUTH DAILY. 07/02/17  Yes Nida, Denman GeorgeGebreselassie W, MD  levothyroxine (SYNTHROID, LEVOTHROID) 137 MCG tablet TAKE 1 TABLET BY MOUTH ONCE DAILY FOR THYROID. 04/16/17  Yes Nida, Denman GeorgeGebreselassie W, MD  Multiple Vitamins-Minerals (MULTIVITAMIN WITH MINERALS) tablet Take 1 tablet by mouth daily.     Yes [provider]  simvastatin (ZOCOR) 40 MG tablet Take 1 tablet (40 mg total) by mouth at bedtime. 01/15/17  Yes Nida, Denman GeorgeGebreselassie W, MD  vitamin B-12 (CYANOCOBALAMIN) 100 MCG tablet Take 100 mcg by mouth daily.   Yes [provider]  vitamin C (ASCORBIC ACID) 500 MG tablet Take 500 mg by mouth daily.   Yes [provider]  Continuous Blood Gluc Sensor (FREESTYLE LIBRE SENSOR SYSTEM) MISC Use one sensor every 10 days. 04/17/17   Roma KayserNida, Gebreselassie W, MD  SURE COMFORT PEN NEEDLES 31G X 8 MM MISC USE AS DIRECTED WITH LANTUS AND NOVOLOG UP TO FOUR TIMES DAILY. 05/24/17   Roma KayserNida, Gebreselassie W, MD    Family History No family history on file.  Social History Social History   Tobacco Use  . Smoking status: Former Smoker    Packs/day: 1.00    Years: 50.00    Pack years: 50.00    Types: Cigarettes    Last attempt to quit: 01/15/2001    Years since quitting: 16.4  . Smokeless tobacco: Never Used  Substance Use Topics  . Alcohol use: No  . Drug use: No     Allergies   Alprazolam; Atorvastatin; Gemfibrozil; and Invokana [canagliflozin]   Review of Systems Review of Systems ROS: Statement: All systems negative except as marked or noted in the HPI; Constitutional: Negative for fever and chills. ; ; Eyes: Negative for eye pain, redness and discharge. ; ; ENMT: Negative for ear pain,  hoarseness, nasal congestion, sinus pressure and sore throat. ; ; Cardiovascular: Negative for chest pain, palpitations, diaphoresis, dyspnea and peripheral edema. ; ; Respiratory: Negative for cough, wheezing and stridor. ; ; Gastrointestinal: Negative for nausea, vomiting, diarrhea, abdominal pain, blood in stool, hematemesis, jaundice and rectal bleeding. . ; ; Genitourinary: Negative for dysuria, flank pain and hematuria. ; ; Musculoskeletal: +LBP, left hip pain. Negative for neck pain. Negative for swelling and trauma.; ; Skin: Negative for pruritus, rash, abrasions, blisters, bruising and skin lesion.; ; Neuro: Negative for headache, lightheadedness and neck stiffness. Negative  for weakness, altered level of consciousness, altered mental status, extremity weakness, paresthesias, involuntary movement, seizure and syncope.       Physical Exam Updated Vital Signs BP (!) 141/78 (BP Location: Right Arm)   Pulse 94   Temp 98.3 F (36.8 C) (Oral)   Resp 18   Ht 5\' 9"  (1.753 m)   Wt 127 kg (280 lb)   SpO2 95%   BMI 41.35 kg/m   Physical Exam 1700: Physical examination:  Nursing notes reviewed; Vital signs and O2 SAT reviewed;  Constitutional: Well developed, Well nourished, Well hydrated, In no acute distress; Head:  Normocephalic, atraumatic; Eyes: EOMI, PERRL, No scleral icterus; ENMT: Mouth and pharynx normal, Mucous membranes moist; Neck: Supple, Full range of motion, No lymphadenopathy; Cardiovascular: Regular rate and rhythm, No gallop; Respiratory: Breath sounds clear & equal bilaterally, No wheezes.  Speaking full sentences with ease, Normal respiratory effort/excursion; Chest: Nontender, Movement normal; Abdomen: Soft, Nontender, Nondistended, Normal bowel sounds; Genitourinary: No CVA tenderness; Spine:  No midline CS, TS, LS tenderness. +TTP left lumbar paraspinal muscles.;;  Extremities: Pulses normal, Pelvis stable. +left hip tenderness to palp. NT left knee/ankle/foot. LLE muscles  compartments soft. Palp pedal pulses. No edema, No calf tenderness, edema or asymmetry.; Neuro: AA&Ox3, Major CN grossly intact.  Speech clear. No gross focal motor or sensory deficits in extremities. Walks steadily with cane. Climbs on and off stretcher easily by himself..; Skin: Color normal, Warm, Dry.     ED Treatments / Results  Labs (all labs ordered are listed, but only abnormal results are displayed)   EKG  EKG Interpretation None       Radiology   Procedures Procedures (including critical care time)  Medications Ordered in ED Medications  oxyCODONE-acetaminophen (PERCOCET/ROXICET) 5-325 MG per tablet 1 tablet (not administered)     Initial Impression / Assessment and Plan / ED Course  I have reviewed the triage vital signs and the nursing notes.  Pertinent labs & imaging results that were available during my care of the patient were reviewed by me and considered in my medical decision making (see chart for details).  MDM Reviewed: previous chart, nursing note and vitals Interpretation: x-ray and CT scan    Ct Lumbar Spine Wo Contrast Result Date: 07/07/2017 CLINICAL DATA:  61 y/o M; left hip pain radiating to the left knee. No known injury. History of L3-4 surgery. EXAM: CT LUMBAR SPINE WITHOUT CONTRAST TECHNIQUE: Multidetector CT imaging of the lumbar spine was performed without intravenous contrast administration. Multiplanar CT image reconstructions were also generated. COMPARISON:  11/07/2015 lumbar spine radiographs. FINDINGS: Segmentation: 5 lumbar type vertebrae. Alignment: Normal. Vertebrae: No acute fracture or focal pathologic process. L3-4 interbody fusion, right laminectomy, partial right facetectomy. Paraspinal and other soft tissues: Negative. Disc levels: Mild discogenic and advanced facet arthropathy. Disc bulge with facet arthrosis at the left L2-3 and L4-5 levels with foraminal encroachment. Moderate multifactorial L4-5 canal stenosis. IMPRESSION:  1. No acute osseous abnormality. Stable postsurgical changes related L3-4 interbody fusion, right laminectomy, partial right facetectomy. 2. Lumbar spondylosis with prominent facet arthropathy. 3. Disc and facet disease with foraminal stenosis at the left L2-3 and L4-5 levels. 4. Moderate multifactorial L4-5 canal stenosis. Electronically Signed   By: Mitzi Hansen M.D.   On: 07/07/2017 19:16   Dg Knee Complete 4 Views Left Result Date: 07/07/2017 CLINICAL DATA:  Patient with left knee pain. EXAM: LEFT KNEE - COMPLETE 4+ VIEW COMPARISON:  None. FINDINGS: Normal anatomic alignment. No evidence for acute fracture dislocation. Regional  soft tissues are unremarkable. Surgical clips. IMPRESSION: No acute osseous abnormality. Electronically Signed   By: Annia Beltrew  Davis M.D.   On: 07/07/2017 20:07   Dg Hip Unilat With Pelvis 2-3 Views Left Result Date: 07/07/2017 CLINICAL DATA:  Patient with left hip pain radiating to the left knee. EXAM: DG HIP (WITH OR WITHOUT PELVIS) 2-3V LEFT COMPARISON:  None. FINDINGS: Lumbar spinal hardware. SI joints unremarkable. Eft hip joint degenerative changes. Normal anatomic alignment. No evidence for acute fracture or dislocation. IMPRESSION: Mild left hip joint degenerative changes. Electronically Signed   By: Annia Beltrew  Davis M.D.   On: 07/07/2017 20:05     1700:  Pt states he only takes extra strength tylenol for pain. Epic chart reviewed:  Dr. Channing Muttersoy rx hydrocodone 10/325mg , #180, on 06/11/2017 as well as valium 10mg , #45, on 06/11/2017.  2015:  XR/CT as above. Tx symptomatically at this time. Long hx of chronic pain. Pt endorses acute flair of his usual long standing chronic pain today, no change from his usual chronic pain pattern.  Pt encouraged to f/u with his PMD, Neurosurgeon, and Pain Management doctor for good continuity of care and control of his chronic pain.  Pt verb understanding.   Dx and testing d/w pt and family.  Questions answered.  Verb understanding,  agreeable to d/c home with outpt f/u.   Final Clinical Impressions(s) / ED Diagnoses   Final diagnoses:  None    ED Discharge Orders    None       Samuel JesterMcManus, Mikel Pyon, DO 07/10/17 [id number]

## 2017-07-07 NOTE — ED Notes (Signed)
Patient transported to CT 

## 2017-07-17 ENCOUNTER — Other Ambulatory Visit: Payer: Self-pay | Admitting: "Endocrinology

## 2017-07-17 DIAGNOSIS — E782 Mixed hyperlipidemia: Secondary | ICD-10-CM | POA: Diagnosis not present

## 2017-07-17 DIAGNOSIS — E1159 Type 2 diabetes mellitus with other circulatory complications: Secondary | ICD-10-CM | POA: Diagnosis not present

## 2017-07-17 DIAGNOSIS — E039 Hypothyroidism, unspecified: Secondary | ICD-10-CM | POA: Diagnosis not present

## 2017-07-17 DIAGNOSIS — E1122 Type 2 diabetes mellitus with diabetic chronic kidney disease: Secondary | ICD-10-CM | POA: Diagnosis not present

## 2017-07-18 LAB — LIPID PANEL W/O CHOL/HDL RATIO
Cholesterol, Total: 181 mg/dL (ref 100–199)
HDL: 35 mg/dL — AB (ref >39)
LDL Calculated: 108 mg/dL — ABNORMAL HIGH (ref 0–99)
TRIGLYCERIDES: 191 mg/dL — AB (ref 0–149)
VLDL Cholesterol Cal: 38 mg/dL (ref 5–40)

## 2017-07-18 LAB — T4, FREE: FREE T4: 1.58 ng/dL (ref 0.82–1.77)

## 2017-07-18 LAB — RENAL FUNCTION PANEL
ALBUMIN: 5 g/dL — AB (ref 3.6–4.8)
BUN/Creatinine Ratio: 14 (ref 10–24)
BUN: 19 mg/dL (ref 8–27)
CO2: 24 mmol/L (ref 20–29)
CREATININE: 1.33 mg/dL — AB (ref 0.76–1.27)
Calcium: 10.5 mg/dL — ABNORMAL HIGH (ref 8.6–10.2)
Chloride: 97 mmol/L (ref 96–106)
GFR calc Af Amer: 66 mL/min/{1.73_m2} (ref >59)
GFR, EST NON AFRICAN AMERICAN: 57 mL/min/{1.73_m2} — AB (ref >59)
GLUCOSE: 104 mg/dL — AB (ref 65–99)
PHOSPHORUS: 3.7 mg/dL (ref 2.5–4.5)
POTASSIUM: 4.7 mmol/L (ref 3.5–5.2)
Sodium: 139 mmol/L (ref 134–144)

## 2017-07-18 LAB — TSH: TSH: 0.406 u[IU]/mL — ABNORMAL LOW (ref 0.450–4.500)

## 2017-07-18 LAB — MICROALBUMIN / CREATININE URINE RATIO
CREATININE, UR: 102 mg/dL
Microalb/Creat Ratio: 155 mg/g creat — ABNORMAL HIGH (ref 0.0–30.0)
Microalbumin, Urine: 158.1 ug/mL

## 2017-07-18 LAB — VITAMIN D 25 HYDROXY (VIT D DEFICIENCY, FRACTURES): Vit D, 25-Hydroxy: 24 ng/mL — ABNORMAL LOW (ref 30.0–100.0)

## 2017-07-18 LAB — HGB A1C W/O EAG: Hgb A1c MFr Bld: 7.2 % — ABNORMAL HIGH (ref 4.8–5.6)

## 2017-07-24 ENCOUNTER — Encounter: Payer: Self-pay | Admitting: "Endocrinology

## 2017-07-24 ENCOUNTER — Ambulatory Visit (INDEPENDENT_AMBULATORY_CARE_PROVIDER_SITE_OTHER): Payer: Medicare Other | Admitting: "Endocrinology

## 2017-07-24 VITALS — BP 127/77 | HR 90 | Ht 69.0 in | Wt 289.0 lb

## 2017-07-24 DIAGNOSIS — I10 Essential (primary) hypertension: Secondary | ICD-10-CM | POA: Diagnosis not present

## 2017-07-24 DIAGNOSIS — E1159 Type 2 diabetes mellitus with other circulatory complications: Secondary | ICD-10-CM

## 2017-07-24 DIAGNOSIS — E039 Hypothyroidism, unspecified: Secondary | ICD-10-CM

## 2017-07-24 DIAGNOSIS — E782 Mixed hyperlipidemia: Secondary | ICD-10-CM

## 2017-07-24 MED ORDER — VITAMIN D3 125 MCG (5000 UT) PO CAPS: 5000.0000 [IU] | ORAL_CAPSULE | Freq: Every day | ORAL | 0 refills | Status: DC

## 2017-07-24 MED ORDER — FREESTYLE LIBRE READER DEVI: 1.0000 | Freq: Once | 0 refills | Status: AC

## 2017-07-24 MED ORDER — FREESTYLE LIBRE SENSOR SYSTEM MISC: 2 refills | Status: DC

## 2017-07-24 NOTE — Progress Notes (Signed)
Subjective:    Patient ID: [name], male    DOB: Dec 14, 1955,    Past Medical History:  Diagnosis Date  . ASCVD (arteriosclerotic cardiovascular disease)    -MI in 01/2001 prompted CABG; EF-30% at cath; 50% on echo in 7/02 and normal in 2005  . Cerebrovascular disease     L CEA 06/2004 following left renal embolism; 10/2008 plaque w/o focal stenosis  . Chronic back pain   . Chronic leg pain   . Chronic obstructive pulmonary disease (HCC)   . Degenerative joint disease     chronic LBP-s/p L3-4 fusion  . Diabetes mellitus    -no insulin  . Hyperlipidemia   . Hypertension   . Hypothyroidism   . Obesity   . Restless leg syndrome    Possible  . Tobacco abuse, in remission    50 pack years; discontinued in 2002   Past Surgical History:  Procedure Laterality Date  . CAROTID ENDARTERECTOMY  2005   Left  . CATARACT EXTRACTION     bilateral  . CORONARY ARTERY BYPASS GRAFT  2002  . LUMBAR SPINE SURGERY     L3-4 fusion   Social History   Socioeconomic History  . Marital status: Divorced    Spouse name: None  . Number of children: 1  . Years of education: None  . Highest education level: None  Social Needs  . Financial resource strain: None  . Food insecurity - worry: None  . Food insecurity - inability: None  . Transportation needs - medical: None  . Transportation needs - non-medical: None  Occupational History  . Occupation: veteran    Comment: disabledf  Tobacco Use  . Smoking status: Former Smoker    Packs/day: 1.00    Years: 50.00    Pack years: 50.00    Types: Cigarettes    Last attempt to quit: 01/15/2001    Years since quitting: 16.5  . Smokeless tobacco: Never Used  Substance and Sexual Activity  . Alcohol use: No  . Drug use: No  . Sexual activity: None  Other Topics Concern  . None  Social History Narrative  . None   Outpatient Encounter Medications as of 07/24/2017  Medication Sig  . Acetaminophen (TYLENOL ARTHRITIS EXT RELIEF PO)  Take 1-2 tablets by mouth daily as needed (for pain).  Marland Kitchen albuterol-ipratropium (COMBIVENT) 18-103 MCG/ACT inhaler Inhale 2 puffs into the lungs every 6 (six) hours as needed for wheezing or shortness of breath.   Marland Kitchen aspirin 81 MG tablet Take 81 mg by mouth daily.    . B Complex-Biotin-FA (VITAMIN B50 COMPLEX PO) Take by mouth daily.  . Cholecalciferol (VITAMIN D PO) Take 1 capsule by mouth daily.  . Cholecalciferol (VITAMIN D3) 5000 units CAPS Take 1 capsule (5,000 Units total) by mouth daily.  . Continuous Blood Gluc Receiver (FREESTYLE LIBRE READER) DEVI 1 Piece by Does not apply route once for 1 dose.  . Continuous Blood Gluc Sensor (FREESTYLE LIBRE SENSOR SYSTEM) MISC Use one sensor every 10 days.  . Continuous Blood Gluc Sensor (FREESTYLE LIBRE SENSOR SYSTEM) MISC Use one sensor every 10 days.  . diazepam (VALIUM) 10 MG tablet Take 10 mg by mouth 2 (two) times daily as needed for anxiety.   . docusate sodium (COLACE) 100 MG capsule Take 100 mg by mouth 2 (two) times daily.    Marland Kitchen escitalopram (LEXAPRO) 20 MG tablet Take 20 mg by mouth daily.    . fenofibrate 160 MG tablet Take 160  mg by mouth daily.    . fish oil-omega-3 fatty acids 1000 MG capsule Take 2 g by mouth daily.  Marland Kitchen HYDROcodone-acetaminophen (NORCO) 10-325 MG per tablet Take 1 tablet by mouth every 6 (six) hours as needed.    . Insulin Glargine (LANTUS SOLOSTAR) 100 UNIT/ML Solostar Pen Inject 70 Units into the skin at bedtime.  . insulin lispro (HUMALOG KWIKPEN) 100 UNIT/ML KiwkPen INJECT 10 -  16 UNITS INTO THE SKIN 3 TIMES DAILY WITH MEALS.  Marland Kitchen JANUVIA 50 MG tablet TAKE 1 TABLET BY MOUTH DAILY.  Marland Kitchen levothyroxine (SYNTHROID, LEVOTHROID) 137 MCG tablet TAKE 1 TABLET BY MOUTH ONCE DAILY FOR THYROID.  . Multiple Vitamins-Minerals (MULTIVITAMIN WITH MINERALS) tablet Take 1 tablet by mouth daily.    . simvastatin (ZOCOR) 40 MG tablet Take 1 tablet (40 mg total) by mouth at bedtime.  . SURE COMFORT PEN NEEDLES 31G X 8 MM MISC USE AS  DIRECTED WITH LANTUS AND NOVOLOG UP TO FOUR TIMES DAILY.  . vitamin B-12 (CYANOCOBALAMIN) 100 MCG tablet Take 100 mcg by mouth daily.  . vitamin C (ASCORBIC ACID) 500 MG tablet Take 500 mg by mouth daily.   No facility-administered encounter medications on file as of 07/24/2017.    ALLERGIES: Allergies  Allergen Reactions  . Alprazolam Nausea And Vomiting and Other (See Comments)    Altered mental status  . Atorvastatin Nausea And Vomiting  . Gemfibrozil     unknown  . Invokana [Canagliflozin] Diarrhea and Nausea And Vomiting   VACCINATION STATUS: Immunization History  Administered Date(s) Administered  . Influenza-Unspecified 05/13/2014    Diabetes  He presents for his follow-up diabetic visit. He has type 2 diabetes mellitus. Onset time: He was diagnosed at approximate age of 50 years. His disease course has been stable. There are no hypoglycemic associated symptoms. Pertinent negatives for hypoglycemia include no confusion, headaches, pallor or seizures. Pertinent negatives for diabetes include no chest pain, no fatigue, no polydipsia, no polyphagia, no polyuria and no weakness. There are no hypoglycemic complications. Symptoms are stable. Diabetic complications include a CVA, heart disease and nephropathy. Risk factors for coronary artery disease include diabetes mellitus, dyslipidemia, hypertension, male sex and tobacco exposure. Current diabetic treatment includes insulin injections and oral agent (monotherapy). He is compliant with treatment most of the time. His weight is increasing steadily. He is following a generally unhealthy diet. When asked about meal planning, he reported none. He has had a previous visit with a dietitian. He never participates in exercise. There is no change in his home blood glucose trend. His breakfast blood glucose range is generally 140-180 mg/dl. His lunch blood glucose range is generally 140-180 mg/dl. His dinner blood glucose range is generally 140-180  mg/dl. His bedtime blood glucose range is generally 140-180 mg/dl. His overall blood glucose range is 140-180 mg/dl. An ACE inhibitor/angiotensin II receptor blocker is being taken. Eye exam is current.  Hyperlipidemia  This is a chronic problem. The current episode started more than 1 year ago. Exacerbating diseases include diabetes and obesity. Pertinent negatives include no chest pain, myalgias or shortness of breath. Current antihyperlipidemic treatment includes statins. Risk factors for coronary artery disease include family history, dyslipidemia, diabetes mellitus, hypertension, male sex and a sedentary lifestyle.  Hypertension  This is a chronic problem. The current episode started more than 1 year ago. Pertinent negatives include no chest pain, headaches, neck pain, palpitations or shortness of breath. Risk factors for coronary artery disease include diabetes mellitus, obesity, sedentary lifestyle and smoking/tobacco exposure. Past  treatments include ACE inhibitors. Hypertensive end-organ damage includes CAD/MI and CVA.    Review of Systems  Constitutional: Negative for chills, fatigue, fever and unexpected weight change.  HENT: Negative for dental problem, mouth sores and trouble swallowing.   Eyes: Negative for visual disturbance.  Respiratory: Negative for cough, choking, chest tightness, shortness of breath and wheezing.   Cardiovascular: Negative for chest pain, palpitations and leg swelling.  Gastrointestinal: Negative for abdominal distention, abdominal pain, constipation, diarrhea, nausea and vomiting.  Endocrine: Negative for polydipsia, polyphagia and polyuria.  Genitourinary: Negative for dysuria, flank pain, hematuria and urgency.  Musculoskeletal: Negative for back pain, gait problem, myalgias and neck pain.  Skin: Negative for pallor, rash and wound.  Neurological: Negative for seizures, syncope, weakness, numbness and headaches.  Psychiatric/Behavioral: Negative.  Negative  for confusion and dysphoric mood.    Objective:    BP 127/77   Pulse 90   Ht 5\' 9"  (1.753 m)   Wt 289 lb (131.1 kg)   BMI 42.68 kg/m   Wt Readings from Last 3 Encounters:  07/24/17 289 lb (131.1 kg)  07/07/17 280 lb (127 kg)  04/17/17 274 lb (124.3 kg)    Physical Exam  Constitutional: He is oriented to person, place, and time. He appears well-developed and well-nourished. He is cooperative. No distress.  HENT:  Head: Normocephalic and atraumatic.  Eyes: EOM are normal.  Neck: Normal range of motion. Neck supple. No tracheal deviation present. No thyromegaly present.  Cardiovascular: Normal rate, S1 normal, S2 normal and normal heart sounds. Exam reveals no gallop.  No murmur heard. Pulses:      Dorsalis pedis pulses are 1+ on the right side, and 1+ on the left side.       Posterior tibial pulses are 1+ on the right side, and 1+ on the left side.  Pulmonary/Chest: Breath sounds normal. No respiratory distress. He has no wheezes.  Abdominal: Soft. Bowel sounds are normal. He exhibits no distension. There is no tenderness. There is no guarding and no CVA tenderness.  Musculoskeletal: He exhibits no edema.       Right shoulder: He exhibits no swelling and no deformity.  Neurological: He is alert and oriented to person, place, and time. He has normal strength and normal reflexes. No cranial nerve deficit or sensory deficit. Gait normal.  Skin: Skin is warm and dry. No rash noted. No cyanosis. Nails show no clubbing.  Psychiatric: He has a normal mood and affect. His speech is normal and behavior is normal. Judgment and thought content normal. Cognition and memory are normal.    Results for orders placed or performed in visit on 07/17/17  Renal function panel  Result Value Ref Range   Glucose 104 (H) 65 - 99 mg/dL   BUN 19 8 - 27 mg/dL   Creatinine, Ser 0.981.33 (H) 0.76 - 1.27 mg/dL   GFR calc non Af Amer 57 (L) >59 mL/min/1.73   GFR calc Af Amer 66 >59 mL/min/1.73    BUN/Creatinine Ratio 14 10 - 24   Sodium 139 134 - 144 mmol/L   Potassium 4.7 3.5 - 5.2 mmol/L   Chloride 97 96 - 106 mmol/L   CO2 24 20 - 29 mmol/L   Calcium 10.5 (H) 8.6 - 10.2 mg/dL   Phosphorus 3.7 2.5 - 4.5 mg/dL   Albumin 5.0 (H) 3.6 - 4.8 g/dL  Lipid Panel w/o Chol/HDL Ratio  Result Value Ref Range   Cholesterol, Total 181 100 - 199 mg/dL   Triglycerides  191 (H) 0 - 149 mg/dL   HDL 35 (L) >62 mg/dL   VLDL Cholesterol Cal 38 5 - 40 mg/dL   LDL Calculated 130 (H) 0 - 99 mg/dL  Microalbumin / creatinine urine ratio  Result Value Ref Range   Creatinine, Urine 102.0 Not Estab. mg/dL   Albumin, Urine 865.7 Not Estab. ug/mL   Microalb/Creat Ratio 155.0 (H) 0.0 - 30.0 mg/g creat  Hgb A1c w/o eAG  Result Value Ref Range   Hgb A1c MFr Bld 7.2 (H) 4.8 - 5.6 %  T4, free  Result Value Ref Range   Free T4 1.58 0.82 - 1.77 ng/dL  TSH  Result Value Ref Range   TSH 0.406 (L) 0.450 - 4.500 uIU/mL  VITAMIN D 25 Hydroxy (Vit-D Deficiency, Fractures)  Result Value Ref Range   Vit D, 25-Hydroxy 24.0 (L) 30.0 - 100.0 ng/mL   Lab Results  Component Value Date   WBC 9.1 09/27/2010   HGB 13.2 09/27/2010   HCT 42.7 09/27/2010   MCV 95.1 09/27/2010   PLT 174 09/27/2010    Lab Results  Component Value Date   HGBA1C 7.2 (H) 07/17/2017   HGBA1C 6.7 (H) 04/09/2017   HGBA1C 8.1 (H) 12/11/2016     Lipid Panel     Component Value Date/Time   CHOL 181 07/17/2017 0757   TRIG 191 (H) 07/17/2017 0757   TRIG 329 10/02/2007   HDL 35 (L) 07/17/2017 0757   CHOLHDL 4.5 12/11/2016 0942   CHOLHDL 3.4 Ratio 12/26/2009 1848   VLDL 39 12/26/2009 1848   LDLCALC 108 (H) 07/17/2017 0757   LDLCALC 66 10/02/2007     Assessment & Plan:   1. Type 2 diabetes mellitus with stage 3 chronic kidney disease, with long-term current use of insulin (HCC)  His diabetes is  complicated by  CAD, CKD.CVA, Retinopathy.  Patient came with controlled glucose profile,  A1c slightly increasing to 7.2% from 6.7%,   after generally improving from 10.4%.     Glucose logs and insulin administration records pertaining to this visit,  to be scanned into patient's records.  Recent labs reviewed.  - Patient remains at a high risk for more acute and chronic complications of diabetes which include CAD, CVA, CKD, retinopathy, and neuropathy. These are all discussed in detail with the patient.  - I have re-counseled the patient on diet management and weight loss  by adopting a carbohydrate restricted / protein rich  Diet. - Patient is advised to stick to a routine mealtimes to eat 3 meals  a day and avoid unnecessary snacks ( to snack only to correct hypoglycemia).  -  Suggestion is made for him to avoid simple carbohydrates  from his diet including Cakes, Sweet Desserts / Pastries, Ice Cream, Soda (diet and regular), Sweet Tea, Candies, Chips, Cookies, Store Bought Juices, Alcohol in Excess of  1-2 drinks a day, Artificial Sweeteners, and "Sugar-free" Products. This will help patient to have stable blood glucose profile and potentially avoid unintended weight gain.   - I have approached patient with the following individualized plan to manage diabetes and patient agrees.  -  He will continue to require basal/bolus insulin . -   I will continue his basal insulin Lantus  70 units daily at bedtime, continue Humalog  10 units 3 times a day before meals for pre-meal blood glucose above 90 mg/dL plus correction associated with monitoring of glucose 4 times daily- before meals and at bedtime. - He'll benefit from continuous glucose monitoring.  I have discussed and initiated a prescription for the Freestyle libre device. -Patient is encouraged to call clinic for blood glucose levels less than 70 or above 300 mg /dl.  -Due to CKD patient is not a candidate for metformin , SGLT2i. -I advised him to continue Januvia 50 mg po qday. -He  does not tolerate metformin, and not appropriate candidate due to CKD.  -Target numbers  for A1c, LDL, HDL, Triglycerides, Waist Circumference were discussed in detail.   2) BP/HTN: Continued to improve to target,  I advised him to continue his current blood pressure medications including ACEI. 3) Lipids/HPL: Recent LDL high at 108 increasing from 80. I advised him to be consistent and continue  statins. 4)  Weight/Diet: CDE consult in progress, exercise, and carbohydrates information provided.  5) Hypothyroidism:   - His current thyroid function tests are consistent with appropriate replacement.  - I will continue levothyroxine at 137 g by mouth every morning.   - We discussed about correct intake of levothyroxine, at fasting, with water, separated by at least 30 minutes from breakfast, and separated by more than 4 hours from calcium, iron, multivitamins, acid reflux medications (PPIs). -Patient is made aware of the fact that thyroid hormone replacement is needed for life, dose to be adjusted by periodic monitoring of thyroid function tests.    6) Chronic Care/Health Maintenance:  -Patient  on ACEI and Statin medications and encouraged to continue to follow up with Ophthalmology, Podiatrist at least yearly or according to recommendations, and advised to  stay away from smoking. I have recommended yearly flu vaccine and pneumonia vaccination at least every 5 years; moderate intensity exercise for up to 150 minutes weekly; and  sleep for at least 7 hours a day. - He'll benefit from some more vitamin D supplement, prescribed vitamin D 3 5000 units daily for the next 90 days.  I advised patient to maintain close follow up with his PCP for primary care needs.  Patient is asked to bring meter and  blood glucose logs during his next visit.  - Time spent with the patient: 25 min, of which >50% was spent in reviewing his sugar logs , discussing his hypo- and hyper-glycemic episodes, reviewing his current and  previous labs and insulin doses and developing a plan to avoid hypo- and  hyper-glycemia.   Follow up plan: Return in about 3 months (around 10/22/2017) for meter, and logs.  Marquis LunchGebre Kervens Roper, MD Phone: [phone]  Fax: [phone]  This note was partially dictated with voice recognition software. Similar sounding words can be transcribed inadequately or may not  be corrected upon review.  07/24/2017, 11:17 AM

## 2017-07-24 NOTE — Patient Instructions (Signed)

## 2017-08-02 ENCOUNTER — Other Ambulatory Visit: Payer: Self-pay | Admitting: "Endocrinology

## 2017-08-09 ENCOUNTER — Other Ambulatory Visit: Payer: Self-pay | Admitting: "Endocrinology

## 2017-08-12 NOTE — Telephone Encounter (Signed)
Patient called stating he needs his insulin and lantus and hemalog sent to [facility]. Please advise   Patient states he is out after this morning

## 2017-09-10 DIAGNOSIS — M47816 Spondylosis without myelopathy or radiculopathy, lumbar region: Secondary | ICD-10-CM | POA: Diagnosis not present

## 2017-09-23 ENCOUNTER — Other Ambulatory Visit: Payer: Self-pay | Admitting: "Endocrinology

## 2017-10-07 DIAGNOSIS — M545 Low back pain: Secondary | ICD-10-CM | POA: Diagnosis not present

## 2017-10-07 DIAGNOSIS — N183 Chronic kidney disease, stage 3 (moderate): Secondary | ICD-10-CM | POA: Diagnosis not present

## 2017-10-07 DIAGNOSIS — M1991 Primary osteoarthritis, unspecified site: Secondary | ICD-10-CM | POA: Diagnosis not present

## 2017-10-07 DIAGNOSIS — E118 Type 2 diabetes mellitus with unspecified complications: Secondary | ICD-10-CM | POA: Diagnosis not present

## 2017-10-07 DIAGNOSIS — E1122 Type 2 diabetes mellitus with diabetic chronic kidney disease: Secondary | ICD-10-CM | POA: Diagnosis not present

## 2017-10-14 ENCOUNTER — Other Ambulatory Visit: Payer: Self-pay | Admitting: "Endocrinology

## 2017-10-14 DIAGNOSIS — E1159 Type 2 diabetes mellitus with other circulatory complications: Secondary | ICD-10-CM | POA: Diagnosis not present

## 2017-10-14 DIAGNOSIS — E039 Hypothyroidism, unspecified: Secondary | ICD-10-CM | POA: Diagnosis not present

## 2017-10-14 DIAGNOSIS — E118 Type 2 diabetes mellitus with unspecified complications: Secondary | ICD-10-CM | POA: Diagnosis not present

## 2017-10-15 LAB — COMPREHENSIVE METABOLIC PANEL
A/G RATIO: 2 (ref 1.2–2.2)
ALT: 43 IU/L (ref 0–44)
AST: 32 IU/L (ref 0–40)
Albumin: 4.9 g/dL — ABNORMAL HIGH (ref 3.6–4.8)
Alkaline Phosphatase: 36 IU/L — ABNORMAL LOW (ref 39–117)
BILIRUBIN TOTAL: 0.7 mg/dL (ref 0.0–1.2)
BUN/Creatinine Ratio: 15 (ref 10–24)
BUN: 24 mg/dL (ref 8–27)
CALCIUM: 10.2 mg/dL (ref 8.6–10.2)
CHLORIDE: 99 mmol/L (ref 96–106)
CO2: 24 mmol/L (ref 20–29)
Creatinine, Ser: 1.57 mg/dL — ABNORMAL HIGH (ref 0.76–1.27)
GFR calc Af Amer: 54 mL/min/{1.73_m2} — ABNORMAL LOW (ref >59)
GFR, EST NON AFRICAN AMERICAN: 47 mL/min/{1.73_m2} — AB (ref >59)
GLOBULIN, TOTAL: 2.5 g/dL (ref 1.5–4.5)
Glucose: 92 mg/dL (ref 65–99)
POTASSIUM: 4.5 mmol/L (ref 3.5–5.2)
SODIUM: 142 mmol/L (ref 134–144)
Total Protein: 7.4 g/dL (ref 6.0–8.5)

## 2017-10-15 LAB — T4, FREE: Free T4: 1.31 ng/dL (ref 0.82–1.77)

## 2017-10-15 LAB — SPECIMEN STATUS REPORT

## 2017-10-15 LAB — VITAMIN D 25 HYDROXY (VIT D DEFICIENCY, FRACTURES): Vit D, 25-Hydroxy: 35.5 ng/mL (ref 30.0–100.0)

## 2017-10-15 LAB — HGB A1C W/O EAG: Hgb A1c MFr Bld: 7.7 % — ABNORMAL HIGH (ref 4.8–5.6)

## 2017-10-16 ENCOUNTER — Other Ambulatory Visit: Payer: Self-pay | Admitting: "Endocrinology

## 2017-10-24 ENCOUNTER — Encounter: Payer: Self-pay | Admitting: "Endocrinology

## 2017-10-24 ENCOUNTER — Ambulatory Visit: Payer: Medicare Other | Admitting: "Endocrinology

## 2017-10-24 VITALS — BP 133/78 | HR 73 | Ht 69.0 in | Wt 282.0 lb

## 2017-10-24 DIAGNOSIS — E782 Mixed hyperlipidemia: Secondary | ICD-10-CM

## 2017-10-24 DIAGNOSIS — E039 Hypothyroidism, unspecified: Secondary | ICD-10-CM | POA: Diagnosis not present

## 2017-10-24 DIAGNOSIS — I10 Essential (primary) hypertension: Secondary | ICD-10-CM

## 2017-10-24 DIAGNOSIS — E1159 Type 2 diabetes mellitus with other circulatory complications: Secondary | ICD-10-CM | POA: Diagnosis not present

## 2017-10-24 MED ORDER — INSULIN GLARGINE 100 UNIT/ML SOLOSTAR PEN: 80.0000 [IU] | PEN_INJECTOR | Freq: Every day | SUBCUTANEOUS | 2 refills | Status: DC

## 2017-10-24 MED ORDER — INSULIN LISPRO 100 UNIT/ML (KWIKPEN): PEN_INJECTOR | SUBCUTANEOUS | 2 refills | Status: DC

## 2017-10-24 NOTE — Patient Instructions (Signed)

## 2017-10-24 NOTE — Progress Notes (Signed)
Subjective:    Patient ID: Alan HighmanWilliam J Ludden, male    DOB: 02-03-1956,    Past Medical History:  Diagnosis Date  . ASCVD (arteriosclerotic cardiovascular disease)    -MI in 01/2001 prompted CABG; EF-30% at cath; 50% on echo in 7/02 and normal in 2005  . Cerebrovascular disease     L CEA 06/2004 following left renal embolism; 10/2008 plaque w/o focal stenosis  . Chronic back pain   . Chronic leg pain   . Chronic obstructive pulmonary disease (HCC)   . Degenerative joint disease     chronic LBP-s/p L3-4 fusion  . Diabetes mellitus    -no insulin  . Hyperlipidemia   . Hypertension   . Hypothyroidism   . Obesity   . Restless leg syndrome    Possible  . Tobacco abuse, in remission    50 pack years; discontinued in 2002   Past Surgical History:  Procedure Laterality Date  . CAROTID ENDARTERECTOMY  2005   Left  . CATARACT EXTRACTION     bilateral  . CORONARY ARTERY BYPASS GRAFT  2002  . LUMBAR SPINE SURGERY     L3-4 fusion   Social History   Socioeconomic History  . Marital status: Divorced    Spouse name: None  . Number of children: 1  . Years of education: None  . Highest education level: None  Social Needs  . Financial resource strain: None  . Food insecurity - worry: None  . Food insecurity - inability: None  . Transportation needs - medical: None  . Transportation needs - non-medical: None  Occupational History  . Occupation: veteran    Comment: disabledf  Tobacco Use  . Smoking status: Former Smoker    Packs/day: 1.00    Years: 50.00    Pack years: 50.00    Types: Cigarettes    Last attempt to quit: 01/15/2001    Years since quitting: 16.7  . Smokeless tobacco: Never Used  Substance and Sexual Activity  . Alcohol use: No  . Drug use: No  . Sexual activity: None  Other Topics Concern  . None  Social History Narrative  . None   Outpatient Encounter Medications as of 10/24/2017  Medication Sig  . Acetaminophen (TYLENOL ARTHRITIS EXT RELIEF PO) Take  1-2 tablets by mouth daily as needed (for pain).  Marland Kitchen. albuterol-ipratropium (COMBIVENT) 18-103 MCG/ACT inhaler Inhale 2 puffs into the lungs every 6 (six) hours as needed for wheezing or shortness of breath.   Marland Kitchen. aspirin 81 MG tablet Take 81 mg by mouth daily.    . B Complex-Biotin-FA (VITAMIN B50 COMPLEX PO) Take by mouth daily.  . Cholecalciferol (VITAMIN D PO) Take 1 capsule by mouth daily.  . Cholecalciferol (VITAMIN D3) 5000 units CAPS Take 1 capsule (5,000 Units total) by mouth daily.  . Continuous Blood Gluc Sensor (FREESTYLE LIBRE SENSOR SYSTEM) MISC Use one sensor every 10 days.  . Continuous Blood Gluc Sensor (FREESTYLE LIBRE SENSOR SYSTEM) MISC Use one sensor every 10 days.  . diazepam (VALIUM) 10 MG tablet Take 10 mg by mouth 2 (two) times daily as needed for anxiety.   . docusate sodium (COLACE) 100 MG capsule Take 100 mg by mouth 2 (two) times daily.    Marland Kitchen. escitalopram (LEXAPRO) 20 MG tablet Take 20 mg by mouth daily.    . fenofibrate 160 MG tablet Take 160 mg by mouth daily.    . fish oil-omega-3 fatty acids 1000 MG capsule Take 2 g by mouth daily.  .Marland Kitchen  HYDROcodone-acetaminophen (NORCO) 10-325 MG per tablet Take 1 tablet by mouth every 6 (six) hours as needed.    . Insulin Glargine (LANTUS SOLOSTAR) 100 UNIT/ML Solostar Pen Inject 80 Units into the skin daily at 10 pm.  . insulin lispro (HUMALOG KWIKPEN) 100 UNIT/ML KiwkPen INJECT 12 TO 18 UNITS INTO THE SKIN 3 TIMES DAILY BEFORE MEALS.  Marland Kitchen JANUVIA 50 MG tablet TAKE 1 TABLET BY MOUTH DAILY.  Marland Kitchen levothyroxine (SYNTHROID, LEVOTHROID) 137 MCG tablet TAKE 1 TABLET BY MOUTH ONCE DAILY FOR THYROID.  . Multiple Vitamins-Minerals (MULTIVITAMIN WITH MINERALS) tablet Take 1 tablet by mouth daily.    . simvastatin (ZOCOR) 40 MG tablet Take 1 tablet (40 mg total) by mouth at bedtime.  . SURE COMFORT PEN NEEDLES 31G X 8 MM MISC USE AS DIRECTED WITH LANTUS AND NOVOLOG UP TO FOUR TIMES DAILY.  . vitamin B-12 (CYANOCOBALAMIN) 100 MCG tablet Take 100 mcg  by mouth daily.  . vitamin C (ASCORBIC ACID) 500 MG tablet Take 500 mg by mouth daily.  . [DISCONTINUED] insulin lispro (HUMALOG KWIKPEN) 100 UNIT/ML KiwkPen INJECT 10 TO 16 UNITS INTO THE SKIN 3 TIMES DAILY BEFORE MEALS.  . [DISCONTINUED] LANTUS SOLOSTAR 100 UNIT/ML Solostar Pen INJECT 70 UNITS SUBCUTANEOUSLY AT BEDTIME.   No facility-administered encounter medications on file as of 10/24/2017.    ALLERGIES: Allergies  Allergen Reactions  . Alprazolam Nausea And Vomiting and Other (See Comments)    Altered mental status  . Atorvastatin Nausea And Vomiting  . Gemfibrozil     unknown  . Invokana [Canagliflozin] Diarrhea and Nausea And Vomiting   VACCINATION STATUS: Immunization History  Administered Date(s) Administered  . Influenza-Unspecified 05/13/2014    Diabetes  He presents for his follow-up diabetic visit. He has type 2 diabetes mellitus. Onset time: He was diagnosed at approximate age of 50 years. His disease course has been worsening. There are no hypoglycemic associated symptoms. Pertinent negatives for hypoglycemia include no confusion, headaches, pallor or seizures. Pertinent negatives for diabetes include no chest pain, no fatigue, no polydipsia, no polyphagia, no polyuria and no weakness. There are no hypoglycemic complications. Symptoms are worsening. Diabetic complications include a CVA, heart disease and nephropathy. Risk factors for coronary artery disease include diabetes mellitus, dyslipidemia, hypertension, male sex and tobacco exposure. Current diabetic treatment includes insulin injections and oral agent (monotherapy). He is compliant with treatment most of the time. His weight is fluctuating minimally. He is following a generally unhealthy diet. When asked about meal planning, he reported none. He has had a previous visit with a dietitian. He never participates in exercise. There is no change in his home blood glucose trend. His breakfast blood glucose range is generally  140-180 mg/dl. His lunch blood glucose range is generally 180-200 mg/dl. His dinner blood glucose range is generally 180-200 mg/dl. His bedtime blood glucose range is generally 140-180 mg/dl. His overall blood glucose range is 140-180 mg/dl. An ACE inhibitor/angiotensin II receptor blocker is being taken. Eye exam is current.  Hyperlipidemia  This is a chronic problem. The current episode started more than 1 year ago. Exacerbating diseases include diabetes and obesity. Pertinent negatives include no chest pain, myalgias or shortness of breath. Current antihyperlipidemic treatment includes statins. Risk factors for coronary artery disease include family history, dyslipidemia, diabetes mellitus, hypertension, male sex, a sedentary lifestyle and obesity.  Hypertension  This is a chronic problem. The current episode started more than 1 year ago. Pertinent negatives include no chest pain, headaches, neck pain, palpitations or shortness of  breath. Risk factors for coronary artery disease include diabetes mellitus, obesity, sedentary lifestyle and smoking/tobacco exposure. Past treatments include ACE inhibitors. Hypertensive end-organ damage includes CAD/MI and CVA.    Review of Systems  Constitutional: Negative for chills, fatigue, fever and unexpected weight change.  HENT: Negative for dental problem, mouth sores and trouble swallowing.   Eyes: Negative for visual disturbance.  Respiratory: Negative for cough, choking, chest tightness, shortness of breath and wheezing.   Cardiovascular: Negative for chest pain, palpitations and leg swelling.  Gastrointestinal: Negative for abdominal distention, abdominal pain, constipation, diarrhea, nausea and vomiting.  Endocrine: Negative for polydipsia, polyphagia and polyuria.  Genitourinary: Negative for dysuria, flank pain, hematuria and urgency.  Musculoskeletal: Negative for back pain, gait problem, myalgias and neck pain.  Skin: Negative for pallor, rash and  wound.  Neurological: Negative for seizures, syncope, weakness, numbness and headaches.  Psychiatric/Behavioral: Negative.  Negative for confusion and dysphoric mood.    Objective:    BP 133/78   Pulse 73   Ht 5\' 9"  (1.753 m)   Wt 282 lb (127.9 kg)   BMI 41.64 kg/m   Wt Readings from Last 3 Encounters:  10/24/17 282 lb (127.9 kg)  07/24/17 289 lb (131.1 kg)  07/07/17 280 lb (127 kg)    Physical Exam  Constitutional: He is oriented to person, place, and time. He appears well-developed and well-nourished. He is cooperative. No distress.  HENT:  Head: Normocephalic and atraumatic.  Eyes: EOM are normal.  Neck: Normal range of motion. Neck supple. No tracheal deviation present. No thyromegaly present.  Cardiovascular: Normal rate, S1 normal, S2 normal and normal heart sounds. Exam reveals no gallop.  No murmur heard. Pulses:      Dorsalis pedis pulses are 1+ on the right side, and 1+ on the left side.       Posterior tibial pulses are 1+ on the right side, and 1+ on the left side.  Pulmonary/Chest: Breath sounds normal. No respiratory distress. He has no wheezes.  Abdominal: Soft. Bowel sounds are normal. He exhibits no distension. There is no tenderness. There is no guarding and no CVA tenderness.  Musculoskeletal: He exhibits no edema.       Right shoulder: He exhibits no swelling and no deformity.  Neurological: He is alert and oriented to person, place, and time. He has normal strength and normal reflexes. No cranial nerve deficit or sensory deficit. Gait normal.  Skin: Skin is warm and dry. No rash noted. No cyanosis. Nails show no clubbing.  Psychiatric: He has a normal mood and affect. His speech is normal and behavior is normal. Judgment and thought content normal. Cognition and memory are normal.    Results for orders placed or performed in visit on 10/14/17  Comprehensive metabolic panel  Result Value Ref Range   Glucose 92 65 - 99 mg/dL   BUN 24 8 - 27 mg/dL    Creatinine, Ser 1.61 (H) 0.76 - 1.27 mg/dL   GFR calc non Af Amer 47 (L) >59 mL/min/1.73   GFR calc Af Amer 54 (L) >59 mL/min/1.73   BUN/Creatinine Ratio 15 10 - 24   Sodium 142 134 - 144 mmol/L   Potassium 4.5 3.5 - 5.2 mmol/L   Chloride 99 96 - 106 mmol/L   CO2 24 20 - 29 mmol/L   Calcium 10.2 8.6 - 10.2 mg/dL   Total Protein 7.4 6.0 - 8.5 g/dL   Albumin 4.9 (H) 3.6 - 4.8 g/dL   Globulin, Total 2.5 1.5 -  4.5 g/dL   Albumin/Globulin Ratio 2.0 1.2 - 2.2   Bilirubin Total 0.7 0.0 - 1.2 mg/dL   Alkaline Phosphatase 36 (L) 39 - 117 IU/L   AST 32 0 - 40 IU/L   ALT 43 0 - 44 IU/L  Hgb A1c w/o eAG  Result Value Ref Range   Hgb A1c MFr Bld 7.7 (H) 4.8 - 5.6 %  T4, free  Result Value Ref Range   Free T4 1.31 0.82 - 1.77 ng/dL  VITAMIN D 25 Hydroxy (Vit-D Deficiency, Fractures)  Result Value Ref Range   Vit D, 25-Hydroxy 35.5 30.0 - 100.0 ng/mL  Specimen status report  Result Value Ref Range   specimen status report Comment    Lab Results  Component Value Date   WBC 9.1 09/27/2010   HGB 13.2 09/27/2010   HCT 42.7 09/27/2010   MCV 95.1 09/27/2010   PLT 174 09/27/2010    Lab Results  Component Value Date   HGBA1C 7.7 (H) 10/14/2017   HGBA1C 7.2 (H) 07/17/2017   HGBA1C 6.7 (H) 04/09/2017     Lipid Panel     Component Value Date/Time   CHOL 181 07/17/2017 0757   TRIG 191 (H) 07/17/2017 0757   TRIG 329 10/02/2007   HDL 35 (L) 07/17/2017 0757   CHOLHDL 4.5 12/11/2016 0942   CHOLHDL 3.4 Ratio 12/26/2009 1848   VLDL 39 12/26/2009 1848   LDLCALC 108 (H) 07/17/2017 0757   LDLCALC 66 10/02/2007     Assessment & Plan:   1. Type 2 diabetes mellitus with stage 3 chronic kidney disease, with long-term current use of insulin (HCC)  His diabetes is  complicated by  CAD, CKD.CVA, Retinopathy.  Patient came with uncontrolled glucose profile,  A1c  Higher at 7.7% from  7.2%, after generally improving from 10.4%.     Glucose logs and insulin administration records pertaining to  this visit,  to be scanned into patient's records.  Recent labs reviewed.  - Patient remains at a high risk for more acute and chronic complications of diabetes which include CAD, CVA, CKD, retinopathy, and neuropathy. These are all discussed in detail with the patient.  - I have re-counseled the patient on diet management and weight loss  by adopting a carbohydrate restricted / protein rich  Diet. - Patient is advised to stick to a routine mealtimes to eat 3 meals  a day and avoid unnecessary snacks ( to snack only to correct hypoglycemia).  -  Suggestion is made for him to avoid simple carbohydrates  from his diet including Cakes, Sweet Desserts / Pastries, Ice Cream, Soda (diet and regular), Sweet Tea, Candies, Chips, Cookies, Store Bought Juices, Alcohol in Excess of  1-2 drinks a day, Artificial Sweeteners, and "Sugar-free" Products. This will help patient to have stable blood glucose profile and potentially avoid unintended weight gain.  - I have approached patient with the following individualized plan to manage diabetes and patient agrees.  -  He will continue to require basal/bolus insulin . -   I will increase his his basal insulin Lantus  To 80 units daily at bedtime, increase  Humalog  To 12 units 3 times a day before meals for pre-meal blood glucose above 90 mg/dL plus correction associated with monitoring of glucose 4 times daily- before meals and at bedtime. - He'll benefit from continuous glucose monitoring. I have discussed and initiated a prescription for the Freestyle libre device. -Patient is encouraged to call clinic for blood glucose levels less than  70 or above 300 mg /dl.  -Due to CKD patient is not a candidate for metformin , SGLT2i. -I advised him to continue Januvia 50 mg po qday. -He  does not tolerate metformin, and not appropriate candidate due to CKD.  -Target numbers for A1c, LDL, HDL, Triglycerides, Waist Circumference were discussed in detail.   2) BP/HTN: His  BP Continued to improve to target,  I advised him to continue his current blood pressure medications including ACEI. 3) Lipids/HPL: Recent LDL high at 108 increasing from 80. I advised him to be consistent and continue  statins. 4)  Weight/Diet: CDE consult in progress, exercise, and carbohydrates information provided.  5) Hypothyroidism:   - His current thyroid function tests are consistent with appropriate replacement.  - I will continue  levothyroxine at 137 g by mouth every morning.   - We discussed about correct intake of levothyroxine, at fasting, with water, separated by at least 30 minutes from breakfast, and separated by more than 4 hours from calcium, iron, multivitamins, acid reflux medications (PPIs). -Patient is made aware of the fact that thyroid hormone replacement is needed for life, dose to be adjusted by periodic monitoring of thyroid function tests.    6) Chronic Care/Health Maintenance:  -Patient  on ACEI and Statin medications and encouraged to continue to follow up with Ophthalmology, Podiatrist at least yearly or according to recommendations, and advised to  stay away from smoking. I have recommended yearly flu vaccine and pneumonia vaccination at least every 5 years; moderate intensity exercise for up to 150 minutes weekly; and  sleep for at least 7 hours a day. - He'll benefit from some more vitamin D supplement, prescribed vitamin D 3 5000 units daily for the next 90 days.  I advised patient to maintain close follow up with his PCP for primary care needs. - Time spent with the patient: 25 min, of which >50% was spent in reviewing his blood glucose logs , discussing his hypo- and hyper-glycemic episodes, reviewing his current and  previous labs and insulin doses and developing a plan to avoid hypo- and hyper-glycemia. Please refer to Patient Instructions for Blood Glucose Monitoring and Insulin/Medications Dosing Guide"  in media tab for additional information. [name] participated in the discussions, expressed understanding, and voiced agreement with the above plans.  All questions were answered to his satisfaction. he is encouraged to contact clinic should he have any questions or concerns prior to his return visit.  Follow up plan: Return in about 4 months (around 02/23/2018) for follow up with pre-visit labs, meter, and logs.  Marquis Lunch, MD Phone: [phone]  Fax: [phone]  This note was partially dictated with voice recognition software. Similar sounding words can be transcribed inadequately or may not  be corrected upon review.  10/24/2017, 6:12 PM

## 2017-11-22 ENCOUNTER — Other Ambulatory Visit: Payer: Self-pay

## 2017-11-22 MED ORDER — INSULIN GLARGINE 100 UNIT/ML SOLOSTAR PEN: 80.0000 [IU] | PEN_INJECTOR | Freq: Every day | SUBCUTANEOUS | 2 refills | Status: DC

## 2017-12-31 ENCOUNTER — Other Ambulatory Visit: Payer: Self-pay | Admitting: "Endocrinology

## 2018-01-07 DIAGNOSIS — M47816 Spondylosis without myelopathy or radiculopathy, lumbar region: Secondary | ICD-10-CM | POA: Diagnosis not present

## 2018-01-13 DIAGNOSIS — E039 Hypothyroidism, unspecified: Secondary | ICD-10-CM | POA: Diagnosis not present

## 2018-01-13 DIAGNOSIS — G4762 Sleep related leg cramps: Secondary | ICD-10-CM | POA: Diagnosis not present

## 2018-01-13 DIAGNOSIS — G2581 Restless legs syndrome: Secondary | ICD-10-CM | POA: Diagnosis not present

## 2018-01-28 DIAGNOSIS — E782 Mixed hyperlipidemia: Secondary | ICD-10-CM | POA: Diagnosis not present

## 2018-01-28 DIAGNOSIS — E1122 Type 2 diabetes mellitus with diabetic chronic kidney disease: Secondary | ICD-10-CM | POA: Diagnosis not present

## 2018-02-03 ENCOUNTER — Other Ambulatory Visit: Payer: Self-pay | Admitting: "Endocrinology

## 2018-02-07 DIAGNOSIS — H5213 Myopia, bilateral: Secondary | ICD-10-CM | POA: Diagnosis not present

## 2018-02-09 ENCOUNTER — Other Ambulatory Visit: Payer: Self-pay | Admitting: "Endocrinology

## 2018-02-18 ENCOUNTER — Other Ambulatory Visit: Payer: Self-pay | Admitting: "Endocrinology

## 2018-02-20 ENCOUNTER — Other Ambulatory Visit: Payer: Self-pay | Admitting: "Endocrinology

## 2018-02-20 DIAGNOSIS — E039 Hypothyroidism, unspecified: Secondary | ICD-10-CM | POA: Diagnosis not present

## 2018-02-20 DIAGNOSIS — E118 Type 2 diabetes mellitus with unspecified complications: Secondary | ICD-10-CM | POA: Diagnosis not present

## 2018-02-20 DIAGNOSIS — E1159 Type 2 diabetes mellitus with other circulatory complications: Secondary | ICD-10-CM | POA: Diagnosis not present

## 2018-02-21 LAB — COMPREHENSIVE METABOLIC PANEL
A/G RATIO: 1.5 (ref 1.2–2.2)
ALT: 33 IU/L (ref 0–44)
AST: 24 IU/L (ref 0–40)
Albumin: 4.4 g/dL (ref 3.6–4.8)
Alkaline Phosphatase: 30 IU/L — ABNORMAL LOW (ref 39–117)
BUN/Creatinine Ratio: 15 (ref 10–24)
BUN: 23 mg/dL (ref 8–27)
Bilirubin Total: 0.3 mg/dL (ref 0.0–1.2)
CALCIUM: 9.6 mg/dL (ref 8.6–10.2)
CO2: 28 mmol/L (ref 20–29)
CREATININE: 1.53 mg/dL — AB (ref 0.76–1.27)
Chloride: 100 mmol/L (ref 96–106)
GFR, EST AFRICAN AMERICAN: 56 mL/min/{1.73_m2} — AB (ref >59)
GFR, EST NON AFRICAN AMERICAN: 48 mL/min/{1.73_m2} — AB (ref >59)
Globulin, Total: 2.9 g/dL (ref 1.5–4.5)
Glucose: 56 mg/dL — ABNORMAL LOW (ref 65–99)
Potassium: 5.1 mmol/L (ref 3.5–5.2)
SODIUM: 143 mmol/L (ref 134–144)
TOTAL PROTEIN: 7.3 g/dL (ref 6.0–8.5)

## 2018-02-21 LAB — HGB A1C W/O EAG: Hgb A1c MFr Bld: 6.7 % — ABNORMAL HIGH (ref 4.8–5.6)

## 2018-02-21 LAB — TSH: TSH: 0.501 u[IU]/mL (ref 0.450–4.500)

## 2018-02-21 LAB — T4, FREE: Free T4: 1.32 ng/dL (ref 0.82–1.77)

## 2018-02-21 LAB — SPECIMEN STATUS REPORT

## 2018-02-26 ENCOUNTER — Encounter: Payer: Self-pay | Admitting: "Endocrinology

## 2018-02-26 ENCOUNTER — Ambulatory Visit: Payer: Medicare Other | Admitting: "Endocrinology

## 2018-02-26 VITALS — BP 132/76 | Ht 69.0 in | Wt 283.0 lb

## 2018-02-26 DIAGNOSIS — E1159 Type 2 diabetes mellitus with other circulatory complications: Secondary | ICD-10-CM

## 2018-02-26 DIAGNOSIS — E782 Mixed hyperlipidemia: Secondary | ICD-10-CM

## 2018-02-26 DIAGNOSIS — E039 Hypothyroidism, unspecified: Secondary | ICD-10-CM

## 2018-02-26 DIAGNOSIS — I10 Essential (primary) hypertension: Secondary | ICD-10-CM | POA: Diagnosis not present

## 2018-02-26 MED ORDER — INSULIN LISPRO 100 UNIT/ML (KWIKPEN): PEN_INJECTOR | SUBCUTANEOUS | 2 refills | Status: DC

## 2018-02-26 MED ORDER — INSULIN GLARGINE 100 UNIT/ML SOLOSTAR PEN: 80.0000 [IU] | PEN_INJECTOR | Freq: Every day | SUBCUTANEOUS | 2 refills | Status: DC

## 2018-02-26 MED ORDER — LEVOTHYROXINE SODIUM 137 MCG PO TABS
ORAL_TABLET | ORAL | 6 refills | Status: DC
Start: 2018-02-26 — End: 2018-08-21

## 2018-02-26 NOTE — Progress Notes (Signed)
Subjective:    Patient ID: [name], male    DOB: Jan 02, 1956,    Past Medical History:  Diagnosis Date  . ASCVD (arteriosclerotic cardiovascular disease)    -MI in 01/2001 prompted CABG; EF-30% at cath; 50% on echo in 7/02 and normal in 2005  . Cerebrovascular disease     L CEA 06/2004 following left renal embolism; 10/2008 plaque w/o focal stenosis  . Chronic back pain   . Chronic leg pain   . Chronic obstructive pulmonary disease (HCC)   . Degenerative joint disease     chronic LBP-s/p L3-4 fusion  . Diabetes mellitus    -no insulin  . Hyperlipidemia   . Hypertension   . Hypothyroidism   . Obesity   . Restless leg syndrome    Possible  . Tobacco abuse, in remission    50 pack years; discontinued in 2002   Past Surgical History:  Procedure Laterality Date  . CAROTID ENDARTERECTOMY  2005   Left  . CATARACT EXTRACTION     bilateral  . CORONARY ARTERY BYPASS GRAFT  2002  . LUMBAR SPINE SURGERY     L3-4 fusion   Social History   Socioeconomic History  . Marital status: Divorced    Spouse name: Not on file  . Number of children: 1  . Years of education: Not on file  . Highest education level: Not on file  Occupational History  . Occupation: veteran    Comment: disabledf  Social Needs  . Financial resource strain: Not on file  . Food insecurity:    Worry: Not on file    Inability: Not on file  . Transportation needs:    Medical: Not on file    Non-medical: Not on file  Tobacco Use  . Smoking status: Former Smoker    Packs/day: 1.00    Years: 50.00    Pack years: 50.00    Types: Cigarettes    Last attempt to quit: 01/15/2001    Years since quitting: 17.1  . Smokeless tobacco: Never Used  Substance and Sexual Activity  . Alcohol use: No  . Drug use: No  . Sexual activity: Not on file  Lifestyle  . Physical activity:    Days per week: Not on file    Minutes per session: Not on file  . Stress: Not on file  Relationships  . Social connections:     Talks on phone: Not on file    Gets together: Not on file    Attends religious service: Not on file    Active member of club or organization: Not on file    Attends meetings of clubs or organizations: Not on file    Relationship status: Not on file  Other Topics Concern  . Not on file  Social History Narrative  . Not on file   Outpatient Encounter Medications as of 02/26/2018  Medication Sig  . Acetaminophen (TYLENOL ARTHRITIS EXT RELIEF PO) Take 1-2 tablets by mouth daily as needed (for pain).  Marland Kitchen albuterol-ipratropium (COMBIVENT) 18-103 MCG/ACT inhaler Inhale 2 puffs into the lungs every 6 (six) hours as needed for wheezing or shortness of breath.   Marland Kitchen aspirin 81 MG tablet Take 81 mg by mouth daily.    . B Complex-Biotin-FA (VITAMIN B50 COMPLEX PO) Take by mouth daily.  . Cholecalciferol (VITAMIN D PO) Take 1 capsule by mouth daily.  . Cholecalciferol (VITAMIN D3) 5000 units CAPS Take 1 capsule (5,000 Units total) by mouth daily.  Marland Kitchen  Continuous Blood Gluc Sensor (FREESTYLE LIBRE SENSOR SYSTEM) MISC Use one sensor every 10 days.  . Continuous Blood Gluc Sensor (FREESTYLE LIBRE SENSOR SYSTEM) MISC Use one sensor every 10 days.  . diazepam (VALIUM) 10 MG tablet Take 10 mg by mouth 2 (two) times daily as needed for anxiety.   . docusate sodium (COLACE) 100 MG capsule Take 100 mg by mouth 2 (two) times daily.    Marland Kitchen escitalopram (LEXAPRO) 20 MG tablet Take 20 mg by mouth daily.    . fenofibrate 160 MG tablet Take 160 mg by mouth daily.    . fish oil-omega-3 fatty acids 1000 MG capsule Take 2 g by mouth daily.  Marland Kitchen HYDROcodone-acetaminophen (NORCO) 10-325 MG per tablet Take 1 tablet by mouth every 6 (six) hours as needed.    . Insulin Glargine (LANTUS SOLOSTAR) 100 UNIT/ML Solostar Pen Inject 80 Units into the skin at bedtime.  . insulin lispro (HUMALOG KWIKPEN) 100 UNIT/ML KiwkPen INJECT 10 TO 16 UNITS INTO THE SKIN 3 TIMES DAILY BEFORE MEALS.  Marland Kitchen JANUVIA 50 MG tablet TAKE 1 TABLET BY MOUTH  DAILY.  Marland Kitchen levothyroxine (SYNTHROID, LEVOTHROID) 137 MCG tablet TAKE 1 TABLET BY MOUTH ONCE DAILY FOR THYROID.  . Multiple Vitamins-Minerals (MULTIVITAMIN WITH MINERALS) tablet Take 1 tablet by mouth daily.    . simvastatin (ZOCOR) 40 MG tablet Take 1 tablet (40 mg total) by mouth at bedtime.  . SURE COMFORT PEN NEEDLES 31G X 8 MM MISC USE AS DIRECTED WITH LANTUS AND NOVOLOG UP TO FOUR TIMES DAILY.  . vitamin B-12 (CYANOCOBALAMIN) 100 MCG tablet Take 100 mcg by mouth daily.  . vitamin C (ASCORBIC ACID) 500 MG tablet Take 500 mg by mouth daily.  . [DISCONTINUED] Insulin Glargine (LANTUS SOLOSTAR) 100 UNIT/ML Solostar Pen Inject 80 Units into the skin at bedtime.  . [DISCONTINUED] insulin lispro (HUMALOG KWIKPEN) 100 UNIT/ML KiwkPen INJECT 10 TO 16 UNITS INTO THE SKIN 3 TIMES DAILY BEFORE MEALS.  . [DISCONTINUED] levothyroxine (SYNTHROID, LEVOTHROID) 137 MCG tablet TAKE 1 TABLET BY MOUTH ONCE DAILY FOR THYROID.   No facility-administered encounter medications on file as of 02/26/2018.    ALLERGIES: Allergies  Allergen Reactions  . Alprazolam Nausea And Vomiting and Other (See Comments)    Altered mental status  . Atorvastatin Nausea And Vomiting  . Gemfibrozil     unknown  . Invokana [Canagliflozin] Diarrhea and Nausea And Vomiting   VACCINATION STATUS: Immunization History  Administered Date(s) Administered  . Influenza-Unspecified 05/13/2014    Diabetes  He presents for his follow-up diabetic visit. He has type 2 diabetes mellitus. Onset time: He was diagnosed at approximate age of 50 years. His disease course has been improving. There are no hypoglycemic associated symptoms. Pertinent negatives for hypoglycemia include no confusion, headaches, pallor or seizures. Pertinent negatives for diabetes include no chest pain, no fatigue, no polydipsia, no polyphagia, no polyuria and no weakness. There are no hypoglycemic complications. Symptoms are improving. Diabetic complications include a  CVA, heart disease and nephropathy. Risk factors for coronary artery disease include diabetes mellitus, dyslipidemia, hypertension, male sex and tobacco exposure. Current diabetic treatment includes insulin injections and oral agent (monotherapy). He is compliant with treatment most of the time. His weight is fluctuating minimally. He is following a generally unhealthy diet. When asked about meal planning, he reported none. He has had a previous visit with a dietitian. He never participates in exercise. There is no change in his home blood glucose trend. His breakfast blood glucose range is generally 140-180  mg/dl. His lunch blood glucose range is generally 140-180 mg/dl. His dinner blood glucose range is generally 140-180 mg/dl. His bedtime blood glucose range is generally 140-180 mg/dl. His overall blood glucose range is 140-180 mg/dl. An ACE inhibitor/angiotensin II receptor blocker is being taken. Eye exam is current.  Hyperlipidemia  This is a chronic problem. The current episode started more than 1 year ago. Exacerbating diseases include diabetes and obesity. Pertinent negatives include no chest pain, myalgias or shortness of breath. Current antihyperlipidemic treatment includes statins. Risk factors for coronary artery disease include family history, dyslipidemia, diabetes mellitus, hypertension, male sex, a sedentary lifestyle and obesity.  Hypertension  This is a chronic problem. The current episode started more than 1 year ago. Pertinent negatives include no chest pain, headaches, neck pain, palpitations or shortness of breath. Risk factors for coronary artery disease include diabetes mellitus, obesity, sedentary lifestyle and smoking/tobacco exposure. Past treatments include ACE inhibitors. Hypertensive end-organ damage includes CAD/MI and CVA.    Review of Systems  Constitutional: Negative for chills, fatigue, fever and unexpected weight change.  HENT: Negative for dental problem, mouth sores  and trouble swallowing.   Eyes: Negative for visual disturbance.  Respiratory: Negative for cough, choking, chest tightness, shortness of breath and wheezing.   Cardiovascular: Negative for chest pain, palpitations and leg swelling.  Gastrointestinal: Negative for abdominal distention, abdominal pain, constipation, diarrhea, nausea and vomiting.  Endocrine: Negative for polydipsia, polyphagia and polyuria.  Genitourinary: Negative for dysuria, flank pain, hematuria and urgency.  Musculoskeletal: Negative for back pain, gait problem, myalgias and neck pain.  Skin: Negative for pallor, rash and wound.  Neurological: Negative for seizures, syncope, weakness, numbness and headaches.  Psychiatric/Behavioral: Negative.  Negative for confusion and dysphoric mood.    Objective:    BP 132/76   Ht 5\' 9"  (1.753 m)   Wt 283 lb (128.4 kg)   BMI 41.79 kg/m   Wt Readings from Last 3 Encounters:  02/26/18 283 lb (128.4 kg)  10/24/17 282 lb (127.9 kg)  07/24/17 289 lb (131.1 kg)    Physical Exam  Constitutional: He is oriented to person, place, and time. He appears well-developed. He is cooperative. No distress.  HENT:  Head: Normocephalic and atraumatic.  Eyes: EOM are normal.  Neck: Normal range of motion. Neck supple. No tracheal deviation present. No thyromegaly present.  Cardiovascular: Normal rate, S1 normal and S2 normal. Exam reveals no gallop.  No murmur heard. Pulses:      Dorsalis pedis pulses are 1+ on the right side, and 1+ on the left side.       Posterior tibial pulses are 1+ on the right side, and 1+ on the left side.  Pulmonary/Chest: Effort normal. No respiratory distress. He has no wheezes.  Abdominal: He exhibits no distension. There is no tenderness. There is no guarding and no CVA tenderness.  Musculoskeletal: He exhibits no edema.       Right shoulder: He exhibits no swelling and no deformity.  Neurological: He is alert and oriented to person, place, and time. He has  normal strength. No cranial nerve deficit or sensory deficit. Gait normal.  Skin: Skin is warm and dry. No rash noted. No cyanosis. Nails show no clubbing.  Psychiatric: He has a normal mood and affect. His speech is normal. Judgment normal. Cognition and memory are normal.    Results for orders placed or performed in visit on 02/20/18  Comprehensive metabolic panel  Result Value Ref Range   Glucose 56 (L) 65 -  99 mg/dL   BUN 23 8 - 27 mg/dL   Creatinine, Ser 1.611.53 (H) 0.76 - 1.27 mg/dL   GFR calc non Af Amer 48 (L) >59 mL/min/1.73   GFR calc Af Amer 56 (L) >59 mL/min/1.73   BUN/Creatinine Ratio 15 10 - 24   Sodium 143 134 - 144 mmol/L   Potassium 5.1 3.5 - 5.2 mmol/L   Chloride 100 96 - 106 mmol/L   CO2 28 20 - 29 mmol/L   Calcium 9.6 8.6 - 10.2 mg/dL   Total Protein 7.3 6.0 - 8.5 g/dL   Albumin 4.4 3.6 - 4.8 g/dL   Globulin, Total 2.9 1.5 - 4.5 g/dL   Albumin/Globulin Ratio 1.5 1.2 - 2.2   Bilirubin Total 0.3 0.0 - 1.2 mg/dL   Alkaline Phosphatase 30 (L) 39 - 117 IU/L   AST 24 0 - 40 IU/L   ALT 33 0 - 44 IU/L  Hgb A1c w/o eAG  Result Value Ref Range   Hgb A1c MFr Bld 6.7 (H) 4.8 - 5.6 %  T4, free  Result Value Ref Range   Free T4 1.32 0.82 - 1.77 ng/dL  TSH  Result Value Ref Range   TSH 0.501 0.450 - 4.500 uIU/mL  Specimen status report  Result Value Ref Range   specimen status report Comment    Lab Results  Component Value Date   WBC 9.1 09/27/2010   HGB 13.2 09/27/2010   HCT 42.7 09/27/2010   MCV 95.1 09/27/2010   PLT 174 09/27/2010    Lab Results  Component Value Date   HGBA1C 6.7 (H) 02/20/2018   HGBA1C 7.7 (H) 10/14/2017   HGBA1C 7.2 (H) 07/17/2017     Lipid Panel     Component Value Date/Time   CHOL 181 07/17/2017 0757   TRIG 191 (H) 07/17/2017 0757   TRIG 329 10/02/2007   HDL 35 (L) 07/17/2017 0757   CHOLHDL 4.5 12/11/2016 0942   CHOLHDL 3.4 Ratio 12/26/2009 1848   VLDL 39 12/26/2009 1848   LDLCALC 108 (H) 07/17/2017 0757   LDLCALC 66  10/02/2007     Assessment & Plan:   1. Type 2 diabetes mellitus with stage 3 chronic kidney disease, with long-term current use of insulin (HCC)  His diabetes is  complicated by  CAD, CKD.CVA, Retinopathy.  Patient came with controlled glycemia both fasting and postprandial and A1c of 6.7%, slowly improving from 10.4%.    Glucose logs and insulin administration records pertaining to this visit,  to be scanned into patient's records.  Recent labs reviewed.  - Patient remains at a high risk for more acute and chronic complications of diabetes which include CAD, CVA, CKD, retinopathy, and neuropathy. These are all discussed in detail with the patient.  - I have re-counseled the patient on diet management and weight loss  by adopting a carbohydrate restricted / protein rich  Diet. - Patient is advised to stick to a routine mealtimes to eat 3 meals  a day and avoid unnecessary snacks ( to snack only to correct hypoglycemia).  -  Suggestion is made for him to avoid simple carbohydrates  from his diet including Cakes, Sweet Desserts / Pastries, Ice Cream, Soda (diet and regular), Sweet Tea, Candies, Chips, Cookies, Store Bought Juices, Alcohol in Excess of  1-2 drinks a day, Artificial Sweeteners, and "Sugar-free" Products. This will help patient to have stable blood glucose profile and potentially avoid unintended weight gain.  - I have approached patient with the following individualized plan  to manage diabetes and patient agrees.  -  He will continue to require basal/bolus insulin. -  I will continue his basal insulin Lantus to 80 units daily at bedtime, decrease  Humalog to 10 units 3 times a day before meals for pre-meal blood glucose above 90 mg/dL plus correction associated with monitoring of glucose 4 times daily- before meals and at bedtime. -He would have benefited from continuous glucose monitoring, however his insurance did not provide adequate coverage for this device.   -Patient is  encouraged to call clinic for blood glucose levels less than 70 or above 300 mg /dl.  -Due to CKD patient is not a candidate for metformin , SGLT2i. -I advised him to continue Januvia 50 mg p.o. every morning with breakfast.   -He  does not tolerate metformin, and not appropriate candidate due to CKD.  -Target numbers for A1c, LDL, HDL, Triglycerides, Waist Circumference were discussed in detail.   2) BP/HTN: His blood pressure is controlled to target.   I advised him to continue his current blood pressure medications.  3) Lipids/HPL: His recent lipid panel showed proving LDL at 67 from 108.  He is tolerating simvastatin 40 mg p.o. nightly.    4)  Weight/Diet: CDE consult in progress, exercise, and carbohydrates information provided.  5) Hypothyroidism:   - His current thyroid function tests are consistent with appropriate replacement.  - I will continue  levothyroxine at 137 g by mouth every morning.   - We discussed about correct intake of levothyroxine, at fasting, with water, separated by at least 30 minutes from breakfast, and separated by more than 4 hours from calcium, iron, multivitamins, acid reflux medications (PPIs). -Patient is made aware of the fact that thyroid hormone replacement is needed for life, dose to be adjusted by periodic monitoring of thyroid function tests.    6) Chronic Care/Health Maintenance:  -Patient  on ACEI and Statin medications and encouraged to continue to follow up with Ophthalmology, Podiatrist at least yearly or according to recommendations, and advised to  stay away from smoking. I have recommended yearly flu vaccine and pneumonia vaccination at least every 5 years; moderate intensity exercise for up to 150 minutes weekly; and  sleep for at least 7 hours a day. - He'll benefit from some more vitamin D supplement, prescribed vitamin D 3 5000 units daily for the next 90 days.  I advised patient to maintain close follow up with his PCP for primary  care needs.  - Time spent with the patient: 25 min, of which >50% was spent in reviewing his blood glucose logs , discussing his hypo- and hyper-glycemic episodes, reviewing his current and  previous labs and insulin doses and developing a plan to avoid hypo- and hyper-glycemia. Please refer to Patient Instructions for Blood Glucose Monitoring and Insulin/Medications Dosing Guide"  in media tab for additional information. [name] participated in the discussions, expressed understanding, and voiced agreement with the above plans.  All questions were answered to his satisfaction. he is encouraged to contact clinic should he have any questions or concerns prior to his return visit.  Follow up plan: Return in about 4 months (around 06/29/2018) for follow up with pre-visit labs, meter, and logs.  Marquis Lunch, MD Phone: [phone]  Fax: [phone]  This note was partially dictated with voice recognition software. Similar sounding words can be transcribed inadequately or may not  be corrected upon review.  02/26/2018, 2:36 PM

## 2018-02-26 NOTE — Patient Instructions (Signed)

## 2018-03-14 ENCOUNTER — Other Ambulatory Visit: Payer: Self-pay | Admitting: "Endocrinology

## 2018-03-17 ENCOUNTER — Other Ambulatory Visit: Payer: Self-pay | Admitting: "Endocrinology

## 2018-03-25 ENCOUNTER — Other Ambulatory Visit: Payer: Self-pay | Admitting: "Endocrinology

## 2018-04-15 DIAGNOSIS — M47816 Spondylosis without myelopathy or radiculopathy, lumbar region: Secondary | ICD-10-CM | POA: Diagnosis not present

## 2018-06-03 ENCOUNTER — Other Ambulatory Visit: Payer: Self-pay | Admitting: "Endocrinology

## 2018-06-09 ENCOUNTER — Other Ambulatory Visit: Payer: Self-pay | Admitting: "Endocrinology

## 2018-06-12 DIAGNOSIS — J449 Chronic obstructive pulmonary disease, unspecified: Secondary | ICD-10-CM | POA: Diagnosis not present

## 2018-06-12 DIAGNOSIS — Z0001 Encounter for general adult medical examination with abnormal findings: Secondary | ICD-10-CM | POA: Diagnosis not present

## 2018-06-12 DIAGNOSIS — E039 Hypothyroidism, unspecified: Secondary | ICD-10-CM | POA: Diagnosis not present

## 2018-06-12 DIAGNOSIS — G8929 Other chronic pain: Secondary | ICD-10-CM | POA: Diagnosis not present

## 2018-06-12 DIAGNOSIS — E1122 Type 2 diabetes mellitus with diabetic chronic kidney disease: Secondary | ICD-10-CM | POA: Diagnosis not present

## 2018-06-12 DIAGNOSIS — Z1389 Encounter for screening for other disorder: Secondary | ICD-10-CM | POA: Diagnosis not present

## 2018-06-12 DIAGNOSIS — E782 Mixed hyperlipidemia: Secondary | ICD-10-CM | POA: Diagnosis not present

## 2018-06-12 DIAGNOSIS — I2510 Atherosclerotic heart disease of native coronary artery without angina pectoris: Secondary | ICD-10-CM | POA: Diagnosis not present

## 2018-06-27 ENCOUNTER — Other Ambulatory Visit: Payer: Self-pay | Admitting: "Endocrinology

## 2018-06-27 DIAGNOSIS — E1159 Type 2 diabetes mellitus with other circulatory complications: Secondary | ICD-10-CM | POA: Diagnosis not present

## 2018-06-27 DIAGNOSIS — E1165 Type 2 diabetes mellitus with hyperglycemia: Secondary | ICD-10-CM | POA: Diagnosis not present

## 2018-06-28 LAB — COMPREHENSIVE METABOLIC PANEL
A/G RATIO: 1.8 (ref 1.2–2.2)
ALBUMIN: 4.8 g/dL (ref 3.6–4.8)
ALT: 48 IU/L — ABNORMAL HIGH (ref 0–44)
AST: 37 IU/L (ref 0–40)
Alkaline Phosphatase: 38 IU/L — ABNORMAL LOW (ref 39–117)
BUN / CREAT RATIO: 15 (ref 10–24)
BUN: 23 mg/dL (ref 8–27)
Bilirubin Total: 0.4 mg/dL (ref 0.0–1.2)
CO2: 27 mmol/L (ref 20–29)
CREATININE: 1.51 mg/dL — AB (ref 0.76–1.27)
Calcium: 10.6 mg/dL — ABNORMAL HIGH (ref 8.6–10.2)
Chloride: 97 mmol/L (ref 96–106)
GFR calc Af Amer: 56 mL/min/{1.73_m2} — ABNORMAL LOW (ref >59)
GFR calc non Af Amer: 49 mL/min/{1.73_m2} — ABNORMAL LOW (ref >59)
GLOBULIN, TOTAL: 2.6 g/dL (ref 1.5–4.5)
Glucose: 107 mg/dL — ABNORMAL HIGH (ref 65–99)
POTASSIUM: 5.2 mmol/L (ref 3.5–5.2)
SODIUM: 139 mmol/L (ref 134–144)
Total Protein: 7.4 g/dL (ref 6.0–8.5)

## 2018-06-28 LAB — HGB A1C W/O EAG: Hgb A1c MFr Bld: 6.7 % — ABNORMAL HIGH (ref 4.8–5.6)

## 2018-07-03 ENCOUNTER — Encounter: Payer: Self-pay | Admitting: "Endocrinology

## 2018-07-03 ENCOUNTER — Ambulatory Visit (INDEPENDENT_AMBULATORY_CARE_PROVIDER_SITE_OTHER): Payer: Medicare Other | Admitting: "Endocrinology

## 2018-07-03 ENCOUNTER — Other Ambulatory Visit: Payer: Self-pay | Admitting: "Endocrinology

## 2018-07-03 VITALS — BP 135/78 | HR 73 | Ht 69.0 in | Wt 277.0 lb

## 2018-07-03 DIAGNOSIS — I10 Essential (primary) hypertension: Secondary | ICD-10-CM | POA: Diagnosis not present

## 2018-07-03 DIAGNOSIS — E782 Mixed hyperlipidemia: Secondary | ICD-10-CM | POA: Diagnosis not present

## 2018-07-03 DIAGNOSIS — E039 Hypothyroidism, unspecified: Secondary | ICD-10-CM

## 2018-07-03 DIAGNOSIS — E1159 Type 2 diabetes mellitus with other circulatory complications: Secondary | ICD-10-CM | POA: Diagnosis not present

## 2018-07-03 NOTE — Patient Instructions (Signed)

## 2018-07-03 NOTE — Progress Notes (Signed)
Endocrinology follow-up note   Subjective:    Patient ID: [name], male    DOB: May 14, 1956,    Past Medical History:  Diagnosis Date  . ASCVD (arteriosclerotic cardiovascular disease)    -MI in 01/2001 prompted CABG; EF-30% at cath; 50% on echo in 7/02 and normal in 2005  . Cerebrovascular disease     L CEA 06/2004 following left renal embolism; 10/2008 plaque w/o focal stenosis  . Chronic back pain   . Chronic leg pain   . Chronic obstructive pulmonary disease (HCC)   . Degenerative joint disease     chronic LBP-s/p L3-4 fusion  . Diabetes mellitus    -no insulin  . Hyperlipidemia   . Hypertension   . Hypothyroidism   . Obesity   . Restless leg syndrome    Possible  . Tobacco abuse, in remission    50 pack years; discontinued in 2002   Past Surgical History:  Procedure Laterality Date  . CAROTID ENDARTERECTOMY  2005   Left  . CATARACT EXTRACTION     bilateral  . CORONARY ARTERY BYPASS GRAFT  2002  . LUMBAR SPINE SURGERY     L3-4 fusion   Social History   Socioeconomic History  . Marital status: Divorced    Spouse name: Not on file  . Number of children: 1  . Years of education: Not on file  . Highest education level: Not on file  Occupational History  . Occupation: veteran    Comment: disabledf  Social Needs  . Financial resource strain: Not on file  . Food insecurity:    Worry: Not on file    Inability: Not on file  . Transportation needs:    Medical: Not on file    Non-medical: Not on file  Tobacco Use  . Smoking status: Former Smoker    Packs/day: 1.00    Years: 50.00    Pack years: 50.00    Types: Cigarettes    Last attempt to quit: 01/15/2001    Years since quitting: 17.4  . Smokeless tobacco: Never Used  Substance and Sexual Activity  . Alcohol use: No  . Drug use: No  . Sexual activity: Not on file  Lifestyle  . Physical activity:    Days per week: Not on file    Minutes per session: Not on file  . Stress: Not on file   Relationships  . Social connections:    Talks on phone: Not on file    Gets together: Not on file    Attends religious service: Not on file    Active member of club or organization: Not on file    Attends meetings of clubs or organizations: Not on file    Relationship status: Not on file  Other Topics Concern  . Not on file  Social History Narrative  . Not on file   Outpatient Encounter Medications as of 07/03/2018  Medication Sig  . ACCU-CHEK AVIVA PLUS test strip USE AS DIRECTED UP TO 6 TIMES DAILY.  Marland Kitchen Acetaminophen (TYLENOL ARTHRITIS EXT RELIEF PO) Take 1-2 tablets by mouth daily as needed (for pain).  Marland Kitchen albuterol-ipratropium (COMBIVENT) 18-103 MCG/ACT inhaler Inhale 2 puffs into the lungs every 6 (six) hours as needed for wheezing or shortness of breath.   Marland Kitchen aspirin 81 MG tablet Take 81 mg by mouth daily.    . B Complex-Biotin-FA (VITAMIN B50 COMPLEX PO) Take by mouth daily.  . Cholecalciferol (VITAMIN D PO) Take 1 capsule by mouth daily.  Marland Kitchen  diazepam (VALIUM) 10 MG tablet Take 10 mg by mouth 2 (two) times daily as needed for anxiety.   . docusate sodium (COLACE) 100 MG capsule Take 100 mg by mouth 2 (two) times daily.    Marland Kitchen escitalopram (LEXAPRO) 20 MG tablet Take 20 mg by mouth daily.    . fenofibrate 160 MG tablet Take 160 mg by mouth daily.    . fish oil-omega-3 fatty acids 1000 MG capsule Take 2 g by mouth daily.  Marland Kitchen HYDROcodone-acetaminophen (NORCO) 10-325 MG per tablet Take 2 tablets by mouth every 6 (six) hours as needed.   . insulin lispro (HUMALOG KWIKPEN) 100 UNIT/ML KiwkPen INJECT 10 TO 16 UNITS INTO THE SKIN 3 TIMES DAILY BEFORE MEALS.  Marland Kitchen JANUVIA 50 MG tablet TAKE 1 TABLET BY MOUTH DAILY.  Marland Kitchen LANTUS SOLOSTAR 100 UNIT/ML Solostar Pen INJECT 80 UNITS SUBCUTANEOUSLY AT BEDTIME.  Marland Kitchen levothyroxine (SYNTHROID, LEVOTHROID) 137 MCG tablet TAKE 1 TABLET BY MOUTH ONCE DAILY FOR THYROID.  . Multiple Vitamins-Minerals (MULTIVITAMIN WITH MINERALS) tablet Take 1 tablet by mouth daily.     . simvastatin (ZOCOR) 40 MG tablet Take 1 tablet (40 mg total) by mouth at bedtime.  . SURE COMFORT PEN NEEDLES 31G X 8 MM MISC USE AS DIRECTED WITH LANTUS AND NOVOLOG UP TO FOUR TIMES DAILY.  . vitamin B-12 (CYANOCOBALAMIN) 100 MCG tablet Take 100 mcg by mouth daily.  . vitamin C (ASCORBIC ACID) 500 MG tablet Take 500 mg by mouth daily.  . [DISCONTINUED] Continuous Blood Gluc Sensor (FREESTYLE LIBRE SENSOR SYSTEM) MISC Use one sensor every 10 days.  . [DISCONTINUED] Continuous Blood Gluc Sensor (FREESTYLE LIBRE SENSOR SYSTEM) MISC Use one sensor every 10 days.   No facility-administered encounter medications on file as of 07/03/2018.    ALLERGIES: Allergies  Allergen Reactions  . Alprazolam Nausea And Vomiting and Other (See Comments)    Altered mental status  . Atorvastatin Nausea And Vomiting  . Gemfibrozil     unknown  . Invokana [Canagliflozin] Diarrhea and Nausea And Vomiting   VACCINATION STATUS: Immunization History  Administered Date(s) Administered  . Influenza-Unspecified 05/13/2014    Diabetes  He presents for his follow-up diabetic visit. He has type 2 diabetes mellitus. Onset time: He was diagnosed at approximate age of 50 years. His disease course has been stable. There are no hypoglycemic associated symptoms. Pertinent negatives for hypoglycemia include no confusion, headaches, pallor or seizures. Pertinent negatives for diabetes include no chest pain, no fatigue, no polydipsia, no polyphagia, no polyuria and no weakness. There are no hypoglycemic complications. Symptoms are stable. Diabetic complications include a CVA, heart disease and nephropathy. Risk factors for coronary artery disease include diabetes mellitus, dyslipidemia, hypertension, male sex and tobacco exposure. Current diabetic treatment includes insulin injections and oral agent (monotherapy). He is compliant with treatment most of the time. His weight is fluctuating minimally. He is following a generally  unhealthy diet. When asked about meal planning, he reported none. He has had a previous visit with a dietitian. He never participates in exercise. There is no change in his home blood glucose trend. His breakfast blood glucose range is generally 140-180 mg/dl. His lunch blood glucose range is generally 140-180 mg/dl. His dinner blood glucose range is generally 140-180 mg/dl. His bedtime blood glucose range is generally 140-180 mg/dl. His overall blood glucose range is 140-180 mg/dl. An ACE inhibitor/angiotensin II receptor blocker is being taken. Eye exam is current.  Hyperlipidemia  This is a chronic problem. The current episode started more than  1 year ago. Exacerbating diseases include diabetes and obesity. Pertinent negatives include no chest pain, myalgias or shortness of breath. Current antihyperlipidemic treatment includes statins. Risk factors for coronary artery disease include family history, dyslipidemia, diabetes mellitus, hypertension, male sex, a sedentary lifestyle and obesity.  Hypertension  This is a chronic problem. The current episode started more than 1 year ago. Pertinent negatives include no chest pain, headaches, neck pain, palpitations or shortness of breath. Risk factors for coronary artery disease include diabetes mellitus, obesity, sedentary lifestyle and smoking/tobacco exposure. Past treatments include ACE inhibitors. Hypertensive end-organ damage includes CAD/MI and CVA.    Review of Systems  Constitutional: Negative for chills, fatigue, fever and unexpected weight change.  HENT: Negative for dental problem, mouth sores and trouble swallowing.   Eyes: Negative for visual disturbance.  Respiratory: Negative for cough, choking, chest tightness, shortness of breath and wheezing.   Cardiovascular: Negative for chest pain, palpitations and leg swelling.  Gastrointestinal: Negative for abdominal distention, abdominal pain, constipation, diarrhea, nausea and vomiting.  Endocrine:  Negative for polydipsia, polyphagia and polyuria.  Genitourinary: Negative for dysuria, flank pain, hematuria and urgency.  Musculoskeletal: Negative for back pain, gait problem, myalgias and neck pain.  Skin: Negative for pallor, rash and wound.  Neurological: Negative for seizures, syncope, weakness, numbness and headaches.  Psychiatric/Behavioral: Negative.  Negative for confusion and dysphoric mood.    Objective:    BP 135/78   Pulse 73   Ht 5\' 9"  (1.753 m)   Wt 277 lb (125.6 kg)   BMI 40.91 kg/m   Wt Readings from Last 3 Encounters:  07/03/18 277 lb (125.6 kg)  02/26/18 283 lb (128.4 kg)  10/24/17 282 lb (127.9 kg)    Physical Exam  Constitutional: He is oriented to person, place, and time. He appears well-developed. He is cooperative. No distress.  HENT:  Head: Normocephalic and atraumatic.  Eyes: EOM are normal.  Neck: Normal range of motion. Neck supple. No tracheal deviation present. No thyromegaly present.  Cardiovascular: Normal rate, S1 normal and S2 normal. Exam reveals no gallop.  No murmur heard. Pulses:      Dorsalis pedis pulses are 1+ on the right side, and 1+ on the left side.       Posterior tibial pulses are 1+ on the right side, and 1+ on the left side.  Pulmonary/Chest: Effort normal. No respiratory distress. He has no wheezes.  Abdominal: He exhibits no distension. There is no tenderness. There is no guarding and no CVA tenderness.  Musculoskeletal: He exhibits no edema.       Right shoulder: He exhibits no swelling and no deformity.  Neurological: He is alert and oriented to person, place, and time. He has normal strength. No cranial nerve deficit or sensory deficit. Gait normal.  Skin: Skin is warm and dry. No rash noted. No cyanosis. Nails show no clubbing.  Psychiatric: He has a normal mood and affect. His speech is normal. Judgment normal. Cognition and memory are normal.    Results for orders placed or performed in visit on 06/27/18   Comprehensive metabolic panel  Result Value Ref Range   Glucose 107 (H) 65 - 99 mg/dL   BUN 23 8 - 27 mg/dL   Creatinine, Ser 1.61 (H) 0.76 - 1.27 mg/dL   GFR calc non Af Amer 49 (L) >59 mL/min/1.73   GFR calc Af Amer 56 (L) >59 mL/min/1.73   BUN/Creatinine Ratio 15 10 - 24   Sodium 139 134 - 144 mmol/L  Potassium 5.2 3.5 - 5.2 mmol/L   Chloride 97 96 - 106 mmol/L   CO2 27 20 - 29 mmol/L   Calcium 10.6 (H) 8.6 - 10.2 mg/dL   Total Protein 7.4 6.0 - 8.5 g/dL   Albumin 4.8 3.6 - 4.8 g/dL   Globulin, Total 2.6 1.5 - 4.5 g/dL   Albumin/Globulin Ratio 1.8 1.2 - 2.2   Bilirubin Total 0.4 0.0 - 1.2 mg/dL   Alkaline Phosphatase 38 (L) 39 - 117 IU/L   AST 37 0 - 40 IU/L   ALT 48 (H) 0 - 44 IU/L  Hgb A1c w/o eAG  Result Value Ref Range   Hgb A1c MFr Bld 6.7 (H) 4.8 - 5.6 %   Lab Results  Component Value Date   WBC 9.1 09/27/2010   HGB 13.2 09/27/2010   HCT 42.7 09/27/2010   MCV 95.1 09/27/2010   PLT 174 09/27/2010    Lab Results  Component Value Date   HGBA1C 6.7 (H) 06/27/2018   HGBA1C 6.7 (H) 02/20/2018   HGBA1C 7.7 (H) 10/14/2017     Lipid Panel     Component Value Date/Time   CHOL 181 07/17/2017 0757   TRIG 191 (H) 07/17/2017 0757   TRIG 329 10/02/2007   HDL 35 (L) 07/17/2017 0757   CHOLHDL 4.5 12/11/2016 0942   CHOLHDL 3.4 Ratio 12/26/2009 1848   VLDL 39 12/26/2009 1848   LDLCALC 108 (H) 07/17/2017 0757   LDLCALC 66 10/02/2007     Assessment & Plan:   1. Type 2 diabetes mellitus with stage 3 chronic kidney disease, with long-term current use of insulin (HCC)  His diabetes is  complicated by  CAD, CKD.CVA, Retinopathy.  He came with controlled glycemia both fasting and postprandial.  His A1c remains stable at 6.7%, overall improving from 10.4%.     Glucose logs and insulin administration records pertaining to this visit,  to be scanned into patient's records.  Recent labs reviewed.  - Patient remains at a high risk for more acute and chronic complications  of diabetes which include CAD, CVA, CKD, retinopathy, and neuropathy. These are all discussed in detail with the patient.  - I have re-counseled the patient on diet management and weight loss  by adopting a carbohydrate restricted / protein rich  Diet. - Patient is advised to stick to a routine mealtimes to eat 3 meals  a day and avoid unnecessary snacks ( to snack only to correct hypoglycemia).  -  Suggestion is made for him to avoid simple carbohydrates  from his diet including Cakes, Sweet Desserts / Pastries, Ice Cream, Soda (diet and regular), Sweet Tea, Candies, Chips, Cookies, Store Bought Juices, Alcohol in Excess of  1-2 drinks a day, Artificial Sweeteners, and "Sugar-free" Products. This will help patient to have stable blood glucose profile and potentially avoid unintended weight gain.  - I have approached patient with the following individualized plan to manage diabetes and patient agrees.  -  He will continue to require basal/bolus insulin. -He is advised to continue Lantus 80 units nightly, Humalog 10 units 3 times a day before meals for pre-meal blood glucose above 90 mg/dL plus correction associated with monitoring of glucose 4 times daily- before meals and at bedtime. -He would have benefited from continuous glucose monitoring, however his insurance did not provide adequate coverage for this device.   -Patient is encouraged to call clinic for blood glucose levels less than 70 or above 300 mg /dl.  -Due to CKD patient is  not a candidate for metformin , SGLT2i. -I advised him to continue Januvia 50 mg p.o. every morning with breakfast.   -He  does not tolerate metformin, and not appropriate candidate due to CKD.  -Target numbers for A1c, LDL, HDL, Triglycerides, Waist Circumference were discussed in detail.   2) BP/HTN: His blood pressure is controlled to target.  3) Lipids/HPL: His recent lipid panel showed proving LDL at 67 from 108.  He is advised to continue simvastatin 40 mg  p.o. nightly.    4)  Weight/Diet: CDE consult in progress, exercise, and carbohydrates information provided.  5) Hypothyroidism:   - His current thyroid function tests are consistent with appropriate replacement.  - I will continue  levothyroxine at 137 g by mouth every morning.   - We discussed about correct intake of levothyroxine, at fasting, with water, separated by at least 30 minutes from breakfast, and separated by more than 4 hours from calcium, iron, multivitamins, acid reflux medications (PPIs). -Patient is made aware of the fact that thyroid hormone replacement is needed for life, dose to be adjusted by periodic monitoring of thyroid function tests.    6) Chronic Care/Health Maintenance:  -Patient  on ACEI and Statin medications and encouraged to continue to follow up with Ophthalmology, Podiatrist at least yearly or according to recommendations, and advised to  stay away from smoking. I have recommended yearly flu vaccine and pneumonia vaccination at least every 5 years; moderate intensity exercise for up to 150 minutes weekly; and  sleep for at least 7 hours a day. - He'll benefit from some more vitamin D supplement, prescribed vitamin D 3 5000 units daily for the next 90 days.  I advised patient to maintain close follow up with his PCP for primary care needs.  - Time spent with the patient: 25 min, of which >50% was spent in reviewing his blood glucose logs , discussing his hypo- and hyper-glycemic episodes, reviewing his current and  previous labs and insulin doses and developing a plan to avoid hypo- and hyper-glycemia. Please refer to Patient Instructions for Blood Glucose Monitoring and Insulin/Medications Dosing Guide"  in media tab for additional information. [name] participated in the discussions, expressed understanding, and voiced agreement with the above plans.  All questions were answered to his satisfaction. he is encouraged to contact clinic should he have  any questions or concerns prior to his return visit.  Follow up plan: Return in about 4 months (around 11/01/2018) for Follow up with Pre-visit Labs, Meter, and Logs.  Alan LunchGebre Zidan Helget, MD Phone: [phone]  Fax: [phone]  This note was partially dictated with voice recognition software. Similar sounding words can be transcribed inadequately or may not  be corrected upon review.  07/03/2018, 4:17 PM

## 2018-07-22 DIAGNOSIS — M47816 Spondylosis without myelopathy or radiculopathy, lumbar region: Secondary | ICD-10-CM | POA: Diagnosis not present

## 2018-07-31 DIAGNOSIS — G894 Chronic pain syndrome: Secondary | ICD-10-CM | POA: Diagnosis not present

## 2018-08-19 ENCOUNTER — Other Ambulatory Visit: Payer: Self-pay | Admitting: "Endocrinology

## 2018-08-21 ENCOUNTER — Other Ambulatory Visit: Payer: Self-pay | Admitting: "Endocrinology

## 2018-08-29 ENCOUNTER — Other Ambulatory Visit: Payer: Self-pay | Admitting: "Endocrinology

## 2018-09-08 ENCOUNTER — Other Ambulatory Visit: Payer: Self-pay | Admitting: "Endocrinology

## 2018-09-08 DIAGNOSIS — G894 Chronic pain syndrome: Secondary | ICD-10-CM | POA: Diagnosis not present

## 2018-09-09 ENCOUNTER — Other Ambulatory Visit (HOSPITAL_COMMUNITY)
Admission: RE | Admit: 2018-09-09 | Discharge: 2018-09-09 | Disposition: A | Discharge status: Home / Self Care | Payer: Medicare Other | Source: Ambulatory Visit | Attending: Cardiovascular Disease | Admitting: Cardiovascular Disease

## 2018-09-09 ENCOUNTER — Encounter: Payer: Self-pay | Admitting: Cardiovascular Disease

## 2018-09-09 ENCOUNTER — Ambulatory Visit: Payer: Medicare Other | Admitting: Cardiovascular Disease

## 2018-09-09 VITALS — BP 134/72 | HR 74 | Ht 69.0 in | Wt 283.0 lb

## 2018-09-09 DIAGNOSIS — Z9889 Other specified postprocedural states: Secondary | ICD-10-CM | POA: Insufficient documentation

## 2018-09-09 DIAGNOSIS — I10 Essential (primary) hypertension: Secondary | ICD-10-CM

## 2018-09-09 DIAGNOSIS — R0609 Other forms of dyspnea: Secondary | ICD-10-CM | POA: Diagnosis not present

## 2018-09-09 DIAGNOSIS — I25708 Atherosclerosis of coronary artery bypass graft(s), unspecified, with other forms of angina pectoris: Secondary | ICD-10-CM | POA: Diagnosis not present

## 2018-09-09 DIAGNOSIS — E7849 Other hyperlipidemia: Secondary | ICD-10-CM | POA: Diagnosis not present

## 2018-09-09 DIAGNOSIS — E118 Type 2 diabetes mellitus with unspecified complications: Secondary | ICD-10-CM

## 2018-09-09 DIAGNOSIS — N183 Chronic kidney disease, stage 3 unspecified: Secondary | ICD-10-CM

## 2018-09-09 DIAGNOSIS — Z951 Presence of aortocoronary bypass graft: Secondary | ICD-10-CM

## 2018-09-09 DIAGNOSIS — E785 Hyperlipidemia, unspecified: Secondary | ICD-10-CM

## 2018-09-09 LAB — LIPID PANEL
CHOLESTEROL: 117 mg/dL (ref 0–200)
HDL: 39 mg/dL — ABNORMAL LOW (ref >40)
LDL Cholesterol: 59 mg/dL (ref 0–99)
Total CHOL/HDL Ratio: 3 RATIO
Triglycerides: 96 mg/dL (ref <150)
VLDL: 19 mg/dL (ref 0–40)

## 2018-09-09 NOTE — Patient Instructions (Signed)
Medication Instructions:  Your physician recommends that you continue on your current medications as directed. Please refer to the Current Medication list given to you today.  If you need a refill on your cardiac medications before your next appointment, please call your pharmacy.   Lab work: Fasting Lipids If you have labs (blood work) drawn today and your tests are completely normal, you will receive your results only by: Marland Kitchen MyChart Message (if you have MyChart) OR . A paper copy in the mail If you have any lab test that is abnormal or we need to change your treatment, we will call you to review the results.  Testing/Procedures: Your physician has requested that you have a lexiscan myoview. For further information please visit [email or web address] Please follow instruction sheet, as given.  Your physician has requested that you have a carotid duplex. This test is an ultrasound of the carotid arteries in your neck. It looks at blood flow through these arteries that supply the brain with blood. Allow one hour for this exam. There are no restrictions or special instructions.   Follow-Up: At [facility], you and your health needs are our priority.  As part of our continuing mission to provide you with exceptional heart care, we have created designated Provider Care Teams.  These Care Teams include your primary Cardiologist (physician) and Advanced Practice Providers (APPs -  Physician Assistants and Nurse Practitioners) who all work together to provide you with the care you need, when you need it. You will need a follow up appointment in 12 months.  Please call our office 2 months in advance to schedule this appointment.  You may see Prentice Docker, MD or one of the following Advanced Practice Providers on your designated Care Team:   Randall An, PA-C [facility]) . Jacolyn Reedy, PA-C [facility])  Any Other Special Instructions Will Be Listed Below (If  Applicable). None

## 2018-09-09 NOTE — Progress Notes (Signed)
CARDIOLOGY CONSULT NOTE  Patient ID: [name] MRN: [id number] DOB/AGE: 63-Aug-1957 69 y.o.  Admit date: (Not on file) Primary Physician: Assunta Found, MD Referring Physician: Trey Sailors MD  Reason for Consultation: Coronary artery disease  HPI: [name] is a 63 y.o. male who is being seen today for the evaluation of coronary artery disease at the request of Trey Sailors, MD.   I evaluated him once before on 04/29/2013 but he did not follow-up afterwards.  He has a history of coronary artery disease status post CABG in 2002. He also underwent carotid endarterectomy in 2005. He is followed by Dr. Phillips Odor regularly for his diabetes, hyperlipidemia, and thyroid disease. He also has chronic back problems.   Prior to his CABG, he had symptoms of "indigestion" and he thought it was from a cheeseburger he had eaten that day. He's had no recurrence of such symptoms.  He very seldom has fleeting chest pains lasting seconds.  He has occasional shortness of breath when people are burning things near to his house outdoors.  He seldom has palpitations.  He no longer smokes cigarettes.  He no longer takes Valium or Bystolic.  His chronic lower back pain is followed by his orthopedic surgeon and his PCP who prescribes pain management medications.  I reviewed labs dated 06/27/2018: Sodium 139, potassium 5.2, BUN 23, creatinine 1.51, A1c 6.7%.  TSH was 0.501 on 02/20/2018.  ECG performed in the office today which I ordered and personally interpreted demonstrates normal sinus rhythm with nonspecific IVCD.    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  His mother sees Dr. Jens Som and I have seen his father before.   Allergies  Allergen Reactions  . Alprazolam Nausea And Vomiting and Other (See Comments)    Altered mental status  . Atorvastatin Nausea And Vomiting  . Gemfibrozil     unknown  .  Invokana [Canagliflozin] Diarrhea and Nausea And Vomiting    Current Outpatient Medications  Medication Sig Dispense Refill  . ACCU-CHEK AVIVA PLUS test strip USE AS DIRECTED UP TO 6 TIMES DAILY. 200 each 2  . Acetaminophen (TYLENOL ARTHRITIS EXT RELIEF PO) Take 1-2 tablets by mouth daily as needed (for pain).    Marland Kitchen albuterol-ipratropium (COMBIVENT) 18-103 MCG/ACT inhaler Inhale 2 puffs into the lungs every 6 (six) hours as needed for wheezing or shortness of breath.     Marland Kitchen aspirin 81 MG tablet Take 81 mg by mouth daily.      . B Complex-Biotin-FA (VITAMIN B50 COMPLEX PO) Take by mouth daily.    . Cholecalciferol (VITAMIN D PO) Take 1 capsule by mouth daily.    Marland Kitchen docusate sodium (COLACE) 100 MG capsule Take 100 mg by mouth 2 (two) times daily.      Marland Kitchen escitalopram (LEXAPRO) 20 MG tablet Take 20 mg by mouth daily.      . fenofibrate 160 MG tablet Take 160 mg by mouth daily.      . fish oil-omega-3 fatty acids 1000 MG capsule Take 2 g by mouth daily.    Marland Kitchen HYDROcodone-acetaminophen (NORCO) 10-325 MG per tablet Take 2 tablets by mouth.     . insulin lispro (HUMALOG) 100 UNIT/ML KwikPen INJECT 10 TO 16 UNITS INTO THE SKIN 3 TIMES DAILY BEFORE MEALS. 15 mL 2  . JANUVIA 50 MG tablet TAKE 1 TABLET BY MOUTH DAILY. 90 tablet 0  .  LANTUS SOLOSTAR 100 UNIT/ML Solostar Pen INJECT 80 UNITS SUBCUTANEOUSLY AT BEDTIME. 30 mL 2  . levothyroxine (SYNTHROID, LEVOTHROID) 137 MCG tablet TAKE 1 TABLET BY MOUTH ONCE DAILY FOR THYROID. 30 tablet 3  . Multiple Vitamins-Minerals (MULTIVITAMIN WITH MINERALS) tablet Take 1 tablet by mouth daily.      . simvastatin (ZOCOR) 40 MG tablet Take 1 tablet (40 mg total) by mouth at bedtime. 90 tablet 1  . SURE COMFORT PEN NEEDLES 31G X 8 MM MISC USE AS DIRECTED WITH LANTUS AND NOVOLOG UP TO FOUR TIMES DAILY. 100 each 3  . vitamin B-12 (CYANOCOBALAMIN) 100 MCG tablet Take 100 mcg by mouth daily.    . vitamin C (ASCORBIC ACID) 500 MG tablet Take 500 mg by mouth daily.     No  current facility-administered medications for this visit.     Past Medical History:  Diagnosis Date  . ASCVD (arteriosclerotic cardiovascular disease)    -MI in 01/2001 prompted CABG; EF-30% at cath; 50% on echo in 7/02 and normal in 2005  . Cerebrovascular disease     L CEA 06/2004 following left renal embolism; 10/2008 plaque w/o focal stenosis  . Chronic back pain   . Chronic leg pain   . Chronic obstructive pulmonary disease (HCC)   . Degenerative joint disease     chronic LBP-s/p L3-4 fusion  . Diabetes mellitus    -no insulin  . Hyperlipidemia   . Hypertension   . Hypothyroidism   . Obesity   . Restless leg syndrome    Possible  . Tobacco abuse, in remission    50 pack years; discontinued in 2002    Past Surgical History:  Procedure Laterality Date  . CAROTID ENDARTERECTOMY  2005   Left  . CATARACT EXTRACTION     bilateral  . CORONARY ARTERY BYPASS GRAFT  2002  . LUMBAR SPINE SURGERY     L3-4 fusion    Social History   Socioeconomic History  . Marital status: Divorced    Spouse name: Not on file  . Number of children: 1  . Years of education: Not on file  . Highest education level: Not on file  Occupational History  . Occupation: veteran    Comment: disabledf  Social Needs  . Financial resource strain: Not on file  . Food insecurity:    Worry: Not on file    Inability: Not on file  . Transportation needs:    Medical: Not on file    Non-medical: Not on file  Tobacco Use  . Smoking status: Former Smoker    Packs/day: 1.00    Years: 50.00    Pack years: 50.00    Types: Cigarettes    Last attempt to quit: 01/15/2001    Years since quitting: 17.6  . Smokeless tobacco: Never Used  Substance and Sexual Activity  . Alcohol use: No  . Drug use: No  . Sexual activity: Not on file  Lifestyle  . Physical activity:    Days per week: Not on file    Minutes per session: Not on file  . Stress: Not on file  Relationships  . Social connections:    Talks on  phone: Not on file    Gets together: Not on file    Attends religious service: Not on file    Active member of club or organization: Not on file    Attends meetings of clubs or organizations: Not on file    Relationship status: Not on file  .  Intimate partner violence:    Fear of current or ex partner: Not on file    Emotionally abused: Not on file    Physically abused: Not on file    Forced sexual activity: Not on file  Other Topics Concern  . Not on file  Social History Narrative  . Not on file     No family history of premature CAD in 1st degree relatives.  Current Meds  Medication Sig  . ACCU-CHEK AVIVA PLUS test strip USE AS DIRECTED UP TO 6 TIMES DAILY.  Marland Kitchen Acetaminophen (TYLENOL ARTHRITIS EXT RELIEF PO) Take 1-2 tablets by mouth daily as needed (for pain).  Marland Kitchen albuterol-ipratropium (COMBIVENT) 18-103 MCG/ACT inhaler Inhale 2 puffs into the lungs every 6 (six) hours as needed for wheezing or shortness of breath.   Marland Kitchen aspirin 81 MG tablet Take 81 mg by mouth daily.    . B Complex-Biotin-FA (VITAMIN B50 COMPLEX PO) Take by mouth daily.  . Cholecalciferol (VITAMIN D PO) Take 1 capsule by mouth daily.  Marland Kitchen docusate sodium (COLACE) 100 MG capsule Take 100 mg by mouth 2 (two) times daily.    Marland Kitchen escitalopram (LEXAPRO) 20 MG tablet Take 20 mg by mouth daily.    . fenofibrate 160 MG tablet Take 160 mg by mouth daily.    . fish oil-omega-3 fatty acids 1000 MG capsule Take 2 g by mouth daily.  Marland Kitchen HYDROcodone-acetaminophen (NORCO) 10-325 MG per tablet Take 2 tablets by mouth.   . insulin lispro (HUMALOG) 100 UNIT/ML KwikPen INJECT 10 TO 16 UNITS INTO THE SKIN 3 TIMES DAILY BEFORE MEALS.  Marland Kitchen JANUVIA 50 MG tablet TAKE 1 TABLET BY MOUTH DAILY.  Marland Kitchen LANTUS SOLOSTAR 100 UNIT/ML Solostar Pen INJECT 80 UNITS SUBCUTANEOUSLY AT BEDTIME.  Marland Kitchen levothyroxine (SYNTHROID, LEVOTHROID) 137 MCG tablet TAKE 1 TABLET BY MOUTH ONCE DAILY FOR THYROID.  . Multiple Vitamins-Minerals (MULTIVITAMIN WITH MINERALS) tablet  Take 1 tablet by mouth daily.    . simvastatin (ZOCOR) 40 MG tablet Take 1 tablet (40 mg total) by mouth at bedtime.  . SURE COMFORT PEN NEEDLES 31G X 8 MM MISC USE AS DIRECTED WITH LANTUS AND NOVOLOG UP TO FOUR TIMES DAILY.  . vitamin B-12 (CYANOCOBALAMIN) 100 MCG tablet Take 100 mcg by mouth daily.  . vitamin C (ASCORBIC ACID) 500 MG tablet Take 500 mg by mouth daily.  . [DISCONTINUED] diazepam (VALIUM) 10 MG tablet Take 10 mg by mouth 2 (two) times daily as needed for anxiety.       Review of systems complete and found to be negative unless listed above in HPI    Physical exam Blood pressure 134/72, pulse 74, height 5\' 9"  (1.753 m), weight 283 lb (128.4 kg), SpO2 94 %. General: NAD Neck: No JVD, no thyromegaly or thyroid nodule.  Lungs: Clear to auscultation bilaterally with normal respiratory effort. CV: Nondisplaced PMI. Regular rate and rhythm, normal S1/S2, no S3/S4, no murmur.  No peripheral edema.  No carotid bruit.    Abdomen: Soft, nontender, no distention.  Skin: Intact without lesions or rashes.  Neurologic: Alert and oriented x 3.  Psych: Normal affect. Extremities: No clubbing or cyanosis.  HEENT: Normal.   ECG: Most recent ECG reviewed.   Labs: Lab Results  Component Value Date/Time   K 5.2 06/27/2018 08:06 AM   BUN 23 06/27/2018 08:06 AM   CREATININE 1.51 (H) 06/27/2018 08:06 AM   ALT 48 (H) 06/27/2018 08:06 AM   TSH 0.501 02/20/2018 02:11 PM   HGB 13.2 09/27/2010  Lipids: Lab Results  Component Value Date/Time   LDLCALC 108 (H) 07/17/2017 07:57 AM   LDLCALC 66 10/02/2007   CHOL 181 07/17/2017 07:57 AM   TRIG 191 (H) 07/17/2017 07:57 AM   TRIG 329 10/02/2007   HDL 35 (L) 07/17/2017 07:57 AM        ASSESSMENT AND PLAN:  1.  Coronary artery disease: History of CABG in 2002.  Symptomatically stable overall.  Currently on aspirin and simvastatin.  As it has been 18 years since CABG, I will obtain a screening Lexiscan Myoview stress test to  evaluate for any significant ischemia.  2.  Hypertension: Blood pressure is normal.  No changes to therapy.  3.  Hyperlipidemia: Most recent LDL in EMR is 108 on 07/17/2017.  I will check lipids.  For now continue current dose of simvastatin.  4.  History of carotid endarterectomy: I will obtain carotid Dopplers.  Continue aspirin and statin.  5.  Chronic kidney disease stage III: Creatinine 1.51 on 06/27/2018.  Avoid nephrotoxic medications.  6.  Type 2 diabetes mellitus: Followed by endocrinology.  A1c 6.7% on 06/27/2018.  Also on statin therapy.    Disposition: Follow up in 1 year.    Signed: Prentice DockerSuresh Marsi Turvey, M.D., F.A.C.C.  09/09/2018, 8:41 AM

## 2018-09-18 ENCOUNTER — Encounter (HOSPITAL_COMMUNITY): Payer: Self-pay

## 2018-09-18 ENCOUNTER — Ambulatory Visit (HOSPITAL_COMMUNITY)
Admission: RE | Admit: 2018-09-18 | Discharge: 2018-09-18 | Disposition: A | Discharge status: Home / Self Care | Payer: Medicare Other | Source: Ambulatory Visit | Attending: Cardiovascular Disease | Admitting: Cardiovascular Disease

## 2018-09-18 ENCOUNTER — Encounter (HOSPITAL_BASED_OUTPATIENT_CLINIC_OR_DEPARTMENT_OTHER)
Admission: RE | Admit: 2018-09-18 | Discharge: 2018-09-18 | Disposition: A | Discharge status: Home / Self Care | Payer: Medicare Other | Source: Ambulatory Visit | Attending: Cardiovascular Disease | Admitting: Cardiovascular Disease

## 2018-09-18 ENCOUNTER — Encounter (HOSPITAL_COMMUNITY)
Admission: RE | Admit: 2018-09-18 | Discharge: 2018-09-18 | Disposition: A | Discharge status: Home / Self Care | Payer: Medicare Other | Source: Ambulatory Visit | Attending: Cardiovascular Disease | Admitting: Cardiovascular Disease

## 2018-09-18 DIAGNOSIS — Z9889 Other specified postprocedural states: Secondary | ICD-10-CM | POA: Insufficient documentation

## 2018-09-18 DIAGNOSIS — R0609 Other forms of dyspnea: Secondary | ICD-10-CM

## 2018-09-18 DIAGNOSIS — I6523 Occlusion and stenosis of bilateral carotid arteries: Secondary | ICD-10-CM | POA: Diagnosis not present

## 2018-09-18 DIAGNOSIS — Z951 Presence of aortocoronary bypass graft: Secondary | ICD-10-CM | POA: Insufficient documentation

## 2018-09-18 HISTORY — DX: Unspecified asthma, uncomplicated: J45.909

## 2018-09-18 LAB — NM MYOCAR MULTI W/SPECT W/WALL MOTION / EF
LV dias vol: 142 mL (ref 62–150)
LV sys vol: 62 mL
Peak HR: 77 {beats}/min
RATE: 0.38
Rest HR: 61 {beats}/min
SDS: 4
SRS: 5
SSS: 9
TID: 1.01

## 2018-09-18 MED ORDER — SODIUM CHLORIDE 0.9% FLUSH
INTRAVENOUS | Status: AC
  Administered 2018-09-18: 10 mL via INTRAVENOUS
  Filled 2018-09-18: qty 10

## 2018-09-18 MED ORDER — REGADENOSON 0.4 MG/5ML IV SOLN
INTRAVENOUS | Status: AC
  Administered 2018-09-18: 0.4 mg via INTRAVENOUS
  Filled 2018-09-18: qty 5

## 2018-09-18 MED ORDER — TECHNETIUM TC 99M TETROFOSMIN IV KIT
10.0000 | PACK | Freq: Once | INTRAVENOUS | Status: AC | PRN
  Administered 2018-09-18: 11 via INTRAVENOUS

## 2018-09-18 MED ORDER — TECHNETIUM TC 99M TETROFOSMIN IV KIT
30.0000 | PACK | Freq: Once | INTRAVENOUS | Status: AC | PRN
  Administered 2018-09-18: 33 via INTRAVENOUS

## 2018-10-08 DIAGNOSIS — G894 Chronic pain syndrome: Secondary | ICD-10-CM | POA: Diagnosis not present

## 2018-10-18 DIAGNOSIS — Z1211 Encounter for screening for malignant neoplasm of colon: Secondary | ICD-10-CM | POA: Diagnosis not present

## 2018-10-29 DIAGNOSIS — M47816 Spondylosis without myelopathy or radiculopathy, lumbar region: Secondary | ICD-10-CM | POA: Diagnosis not present

## 2018-10-30 ENCOUNTER — Other Ambulatory Visit: Payer: Self-pay | Admitting: "Endocrinology

## 2018-10-30 DIAGNOSIS — E1159 Type 2 diabetes mellitus with other circulatory complications: Secondary | ICD-10-CM | POA: Diagnosis not present

## 2018-10-30 DIAGNOSIS — E118 Type 2 diabetes mellitus with unspecified complications: Secondary | ICD-10-CM | POA: Diagnosis not present

## 2018-10-31 LAB — COMPREHENSIVE METABOLIC PANEL
ALT: 37 IU/L (ref 0–44)
AST: 27 IU/L (ref 0–40)
Albumin/Globulin Ratio: 1.8 (ref 1.2–2.2)
Albumin: 4.6 g/dL (ref 3.8–4.8)
Alkaline Phosphatase: 30 IU/L — ABNORMAL LOW (ref 39–117)
BILIRUBIN TOTAL: 0.5 mg/dL (ref 0.0–1.2)
BUN/Creatinine Ratio: 17 (ref 10–24)
BUN: 22 mg/dL (ref 8–27)
CHLORIDE: 100 mmol/L (ref 96–106)
CO2: 21 mmol/L (ref 20–29)
Calcium: 10 mg/dL (ref 8.6–10.2)
Creatinine, Ser: 1.26 mg/dL (ref 0.76–1.27)
GFR calc Af Amer: 70 mL/min/{1.73_m2} (ref >59)
GFR calc non Af Amer: 61 mL/min/{1.73_m2} (ref >59)
GLOBULIN, TOTAL: 2.5 g/dL (ref 1.5–4.5)
Glucose: 91 mg/dL (ref 65–99)
Potassium: 4.4 mmol/L (ref 3.5–5.2)
SODIUM: 139 mmol/L (ref 134–144)
Total Protein: 7.1 g/dL (ref 6.0–8.5)

## 2018-10-31 LAB — HGB A1C W/O EAG: Hgb A1c MFr Bld: 7.4 % — ABNORMAL HIGH (ref 4.8–5.6)

## 2018-11-03 ENCOUNTER — Telehealth (INDEPENDENT_AMBULATORY_CARE_PROVIDER_SITE_OTHER): Payer: Medicare Other | Admitting: "Endocrinology

## 2018-11-03 ENCOUNTER — Other Ambulatory Visit: Payer: Self-pay

## 2018-11-03 DIAGNOSIS — E039 Hypothyroidism, unspecified: Secondary | ICD-10-CM

## 2018-11-03 DIAGNOSIS — E782 Mixed hyperlipidemia: Secondary | ICD-10-CM

## 2018-11-03 DIAGNOSIS — Z794 Long term (current) use of insulin: Secondary | ICD-10-CM | POA: Diagnosis not present

## 2018-11-03 DIAGNOSIS — N183 Chronic kidney disease, stage 3 (moderate): Secondary | ICD-10-CM | POA: Diagnosis not present

## 2018-11-03 DIAGNOSIS — E1122 Type 2 diabetes mellitus with diabetic chronic kidney disease: Secondary | ICD-10-CM

## 2018-11-03 DIAGNOSIS — E1159 Type 2 diabetes mellitus with other circulatory complications: Secondary | ICD-10-CM

## 2018-11-03 MED ORDER — LEVOTHYROXINE SODIUM 137 MCG PO TABS: 137.0000 ug | ORAL_TABLET | Freq: Every day | ORAL | 3 refills | Status: DC

## 2018-11-03 MED ORDER — INSULIN LISPRO (1 UNIT DIAL) 100 UNIT/ML (KWIKPEN): PEN_INJECTOR | SUBCUTANEOUS | 2 refills | Status: DC

## 2018-11-03 MED ORDER — INSULIN GLARGINE 100 UNIT/ML SOLOSTAR PEN: PEN_INJECTOR | SUBCUTANEOUS | 2 refills | Status: DC

## 2018-11-03 MED ORDER — SITAGLIPTIN PHOSPHATE 50 MG PO TABS: 50.0000 mg | ORAL_TABLET | Freq: Every day | ORAL | 0 refills | Status: DC

## 2018-11-03 NOTE — Progress Notes (Signed)
Endocrinology Telephone Visit Follow up Note -During COVID -19 Pandemic    Subjective:    Patient ID: [name], male    DOB: 1956/01/31,    Past Medical History:  Diagnosis Date  . ASCVD (arteriosclerotic cardiovascular disease)    -MI in 01/2001 prompted CABG; EF-30% at cath; 50% on echo in 7/02 and normal in 2005  . Asthma   . Cerebrovascular disease     L CEA 06/2004 following left renal embolism; 10/2008 plaque w/o focal stenosis  . Chronic back pain   . Chronic leg pain   . Chronic obstructive pulmonary disease (HCC)   . Degenerative joint disease     chronic LBP-s/p L3-4 fusion  . Diabetes mellitus    -no insulin  . Hyperlipidemia   . Hypertension   . Hypothyroidism   . Obesity   . Restless leg syndrome    Possible  . Tobacco abuse, in remission    50 pack years; discontinued in 2002   Past Surgical History:  Procedure Laterality Date  . CAROTID ENDARTERECTOMY  2005   Left  . CATARACT EXTRACTION     bilateral  . CORONARY ARTERY BYPASS GRAFT  2002  . LUMBAR SPINE SURGERY     L3-4 fusion   Social History   Socioeconomic History  . Marital status: Divorced    Spouse name: Not on file  . Number of children: 1  . Years of education: Not on file  . Highest education level: Not on file  Occupational History  . Occupation: veteran    Comment: disabledf  Social Needs  . Financial resource strain: Not on file  . Food insecurity:    Worry: Not on file    Inability: Not on file  . Transportation needs:    Medical: Not on file    Non-medical: Not on file  Tobacco Use  . Smoking status: Former Smoker    Packs/day: 1.00    Years: 50.00    Pack years: 50.00    Types: Cigarettes    Last attempt to quit: 01/15/2001    Years since quitting: 17.8  . Smokeless tobacco: Never Used  Substance and Sexual Activity  . Alcohol use: No  . Drug use: No  . Sexual activity: Not on file  Lifestyle  . Physical  activity:    Days per week: Not on file    Minutes per session: Not on file  . Stress: Not on file  Relationships  . Social connections:    Talks on phone: Not on file    Gets together: Not on file    Attends religious service: Not on file    Active member of club or organization: Not on file    Attends meetings of clubs or organizations: Not on file    Relationship status: Not on file  Other Topics Concern  . Not on file  Social History Narrative  . Not on file   Outpatient Encounter Medications as of 11/03/2018  Medication Sig  . ACCU-CHEK AVIVA PLUS test strip USE AS DIRECTED UP TO 6 TIMES DAILY.  Marland Kitchen Acetaminophen (TYLENOL ARTHRITIS EXT RELIEF PO) Take 1-2 tablets by mouth daily as needed (for pain).  Marland Kitchen  albuterol-ipratropium (COMBIVENT) 18-103 MCG/ACT inhaler Inhale 2 puffs into the lungs every 6 (six) hours as needed for wheezing or shortness of breath.   Marland Kitchen. aspirin 81 MG tablet Take 81 mg by mouth daily.    . B Complex-Biotin-FA (VITAMIN B50 COMPLEX PO) Take by mouth daily.  . Cholecalciferol (VITAMIN D PO) Take 1 capsule by mouth daily.  Marland Kitchen. docusate sodium (COLACE) 100 MG capsule Take 100 mg by mouth 2 (two) times daily.    Marland Kitchen. escitalopram (LEXAPRO) 20 MG tablet Take 20 mg by mouth daily.    . fenofibrate 160 MG tablet Take 160 mg by mouth daily.    . fish oil-omega-3 fatty acids 1000 MG capsule Take 2 g by mouth daily.  Marland Kitchen. HYDROcodone-acetaminophen (NORCO) 10-325 MG per tablet Take 2 tablets by mouth.   . Insulin Glargine (LANTUS SOLOSTAR) 100 UNIT/ML Solostar Pen INJECT 80 UNITS SUBCUTANEOUSLY AT BEDTIME.  . insulin lispro (HUMALOG) 100 UNIT/ML KwikPen INJECT 10 TO 16 UNITS INTO THE SKIN 3 TIMES DAILY BEFORE MEALS.  Marland Kitchen. levothyroxine (SYNTHROID, LEVOTHROID) 137 MCG tablet Take 1 tablet (137 mcg total) by mouth daily before breakfast.  . Multiple Vitamins-Minerals (MULTIVITAMIN WITH MINERALS) tablet Take 1 tablet by mouth daily.    . simvastatin (ZOCOR) 40 MG tablet Take 1 tablet (40  mg total) by mouth at bedtime.  . sitaGLIPtin (JANUVIA) 50 MG tablet Take 1 tablet (50 mg total) by mouth daily.  . SURE COMFORT PEN NEEDLES 31G X 8 MM MISC USE AS DIRECTED WITH LANTUS AND NOVOLOG UP TO FOUR TIMES DAILY.  . vitamin B-12 (CYANOCOBALAMIN) 100 MCG tablet Take 100 mcg by mouth daily.  . vitamin C (ASCORBIC ACID) 500 MG tablet Take 500 mg by mouth daily.  . [DISCONTINUED] insulin lispro (HUMALOG) 100 UNIT/ML KwikPen INJECT 10 TO 16 UNITS INTO THE SKIN 3 TIMES DAILY BEFORE MEALS.  . [DISCONTINUED] JANUVIA 50 MG tablet TAKE 1 TABLET BY MOUTH DAILY.  . [DISCONTINUED] LANTUS SOLOSTAR 100 UNIT/ML Solostar Pen INJECT 80 UNITS SUBCUTANEOUSLY AT BEDTIME.  . [DISCONTINUED] levothyroxine (SYNTHROID, LEVOTHROID) 137 MCG tablet TAKE 1 TABLET BY MOUTH ONCE DAILY FOR THYROID.   No facility-administered encounter medications on file as of 11/03/2018.    ALLERGIES: Allergies  Allergen Reactions  . Alprazolam Nausea And Vomiting and Other (See Comments)    Altered mental status  . Atorvastatin Nausea And Vomiting  . Gemfibrozil     unknown  . Invokana [Canagliflozin] Diarrhea and Nausea And Vomiting   VACCINATION STATUS: Immunization History  Administered Date(s) Administered  . Influenza-Unspecified 05/13/2014    Diabetes  He presents for his follow-up diabetic visit. He has type 2 diabetes mellitus. Onset time: He was diagnosed at approximate age of 50 years. His disease course has been worsening. There are no hypoglycemic associated symptoms. Pertinent negatives for hypoglycemia include no confusion, headaches, pallor or seizures. Pertinent negatives for diabetes include no chest pain, no fatigue, no polydipsia, no polyphagia, no polyuria and no weakness. There are no hypoglycemic complications. Symptoms are worsening. Diabetic complications include a CVA, heart disease and nephropathy. Risk factors for coronary artery disease include diabetes mellitus, dyslipidemia, hypertension, male sex  and tobacco exposure. Current diabetic treatment includes insulin injections and oral agent (monotherapy). He is compliant with treatment most of the time. His weight is fluctuating minimally. He is following a generally unhealthy diet. When asked about meal planning, he reported none. He has had a previous visit with a dietitian. He never participates in exercise. There is no change in  his home blood glucose trend. His breakfast blood glucose range is generally 140-180 mg/dl. His lunch blood glucose range is generally 140-180 mg/dl. His dinner blood glucose range is generally 140-180 mg/dl. His bedtime blood glucose range is generally 140-180 mg/dl. His overall blood glucose range is 140-180 mg/dl. An ACE inhibitor/angiotensin II receptor blocker is being taken. Eye exam is current.  Hyperlipidemia  This is a chronic problem. The current episode started more than 1 year ago. Exacerbating diseases include diabetes and obesity. Pertinent negatives include no chest pain, myalgias or shortness of breath. Current antihyperlipidemic treatment includes statins. Risk factors for coronary artery disease include family history, dyslipidemia, diabetes mellitus, hypertension, male sex, a sedentary lifestyle and obesity.      Objective:      Results for orders placed or performed during the hospital encounter of 09/18/18  NM Myocar Multi W/Spect W/Wall Motion / EF  Result Value Ref Range   Rest HR 61 bpm   Rest BP 141/72 mmHg   Peak HR 77 bpm   Peak BP 143/73 mmHg   SSS 9    SRS 5    SDS 4    LHR 0.38    TID 1.01    LV sys vol 62 mL   LV dias vol 142 62 - 150 mL   Lab Results  Component Value Date   WBC 9.1 09/27/2010   HGB 13.2 09/27/2010   HCT 42.7 09/27/2010   MCV 95.1 09/27/2010   PLT 174 09/27/2010    Lab Results  Component Value Date   HGBA1C 6.7 (H) 06/27/2018   HGBA1C 6.7 (H) 02/20/2018   HGBA1C 7.7 (H) 10/14/2017     Lipid Panel     Component Value Date/Time   CHOL 117  09/09/2018 0949   CHOL 181 07/17/2017 0757   TRIG 96 09/09/2018 0949   TRIG 329 10/02/2007   HDL 39 (L) 09/09/2018 0949   HDL 35 (L) 07/17/2017 0757   CHOLHDL 3.0 09/09/2018 0949   VLDL 19 09/09/2018 0949   LDLCALC 59 09/09/2018 0949   LDLCALC 108 (H) 07/17/2017 0757   LDLCALC 66 10/02/2007     Assessment & Plan:   1. Type 2 diabetes mellitus with stage 3 chronic kidney disease, with long-term current use of insulin    His diabetes is  complicated by  CAD, CKD.CVA, Retinopathy.  -This is a telephone visit because of the coronavirus pandemic.  His previsit labs show A1c of 7.4% increasing from 6.7% during his last visit.     Recent labs reviewed.  - Patient remains at a high risk for more acute and chronic complications of diabetes which include CAD, CVA, CKD, retinopathy, and neuropathy. These are all discussed in detail with the patient.  -He will continue to require intensive treatment with basal/bolus insulin.   -He is advised to continue Lantus 80 units nightly, Humalog 10 units 3 times a day before meals for pre-meal blood glucose above 90 mg/dL plus correction associated with monitoring of glucose 4 times daily- before meals and at bedtime. -He would have benefited from continuous glucose monitoring, however his insurance did not provide adequate coverage for this device.   -Patient is encouraged to call clinic for blood glucose levels less than 70 or above 300 mg /dl.  -Due to CKD patient is not a candidate for metformin , SGLT2i. -I advised him to continue Januvia 50 mg p.o. every morning with breakfast.   -He  does not tolerate metformin, and not appropriate candidate  due to CKD.  -Target numbers for A1c, LDL, HDL, Triglycerides, Waist Circumference were discussed in detail.   2) Lipids/HPL: His recent lipid panel showed proving LDL at 67 from 108.  He is advised to continue simvastatin 40 mg p.o. nightly.    3) Hypothyroidism:   - His current thyroid function tests  are consistent with appropriate replacement.   -He is advised to continue  levothyroxine at 137 g by mouth every morning.   - We discussed about the correct intake of his thyroid hormone, on empty stomach at fasting, with water, separated by at least 30 minutes from breakfast and other medications,  and separated by more than 4 hours from calcium, iron, multivitamins, acid reflux medications (PPIs). -Patient is made aware of the fact that thyroid hormone replacement is needed for life, dose to be adjusted by periodic monitoring of thyroid function tests.   I advised patient to maintain close follow up with his PCP for primary care needs.  - Time spent with the patient: 25 min, of which >50% was spent in reviewing his blood glucose logs , discussing his hypoglycemia and hyperglycemia episodes, reviewing his current and  previous labs / studies and medications  doses and developing a plan to avoid hypoglycemia and hyperglycemia.   [name] participated in the discussions, expressed understanding, and voiced agreement with the above plans.  All questions were answered to his satisfaction. he is encouraged to contact clinic should he have any questions or concerns prior to his return visit.  Follow up plan: No follow-ups on file.  Marquis Lunch, MD Phone: [phone]  Fax: [phone]  This note was partially dictated with voice recognition software. Similar sounding words can be transcribed inadequately or may not  be corrected upon review.  11/03/2018, 1:22 PM

## 2018-11-04 DIAGNOSIS — Z0000 Encounter for general adult medical examination without abnormal findings: Secondary | ICD-10-CM | POA: Diagnosis not present

## 2018-11-04 DIAGNOSIS — Z1389 Encounter for screening for other disorder: Secondary | ICD-10-CM | POA: Diagnosis not present

## 2018-11-19 ENCOUNTER — Other Ambulatory Visit: Payer: Self-pay | Admitting: "Endocrinology

## 2018-11-26 ENCOUNTER — Other Ambulatory Visit: Payer: Self-pay | Admitting: "Endocrinology

## 2018-12-01 DIAGNOSIS — G894 Chronic pain syndrome: Secondary | ICD-10-CM | POA: Diagnosis not present

## 2018-12-15 ENCOUNTER — Other Ambulatory Visit: Payer: Self-pay

## 2018-12-15 MED ORDER — SITAGLIPTIN PHOSPHATE 50 MG PO TABS: 50.0000 mg | ORAL_TABLET | Freq: Every day | ORAL | 0 refills | Status: DC

## 2018-12-17 ENCOUNTER — Other Ambulatory Visit: Payer: Self-pay | Admitting: "Endocrinology

## 2018-12-29 ENCOUNTER — Telehealth: Payer: Self-pay

## 2018-12-29 NOTE — Telephone Encounter (Signed)
Pt states they have had high BG readings.   Date Before breakfast Before lunch Before supper Bedtime  5/15 [phone] 273  5/16 202  268 255  5/17 229  177 247  5/18 255       Pt taking: Januvia 50 mg,  Humalog 12-18 tidac, Lantus 80 units qhs

## 2018-12-29 NOTE — Telephone Encounter (Signed)
Advice to increase Humalog to 14-20 units TID, Keep lantus at 80 units .

## 2018-12-30 NOTE — Telephone Encounter (Signed)
Pt.notified

## 2018-12-31 IMAGING — DX DG HIP (WITH OR WITHOUT PELVIS) 2-3V*L*
3 series · 3 of 3 positions shown · non-contrast
Comparison: None.

CLINICAL DATA: Patient with left hip pain radiating to the left
knee.

EXAM:
DG HIP (WITH OR WITHOUT PELVIS) 2-3V LEFT

[hip ap]
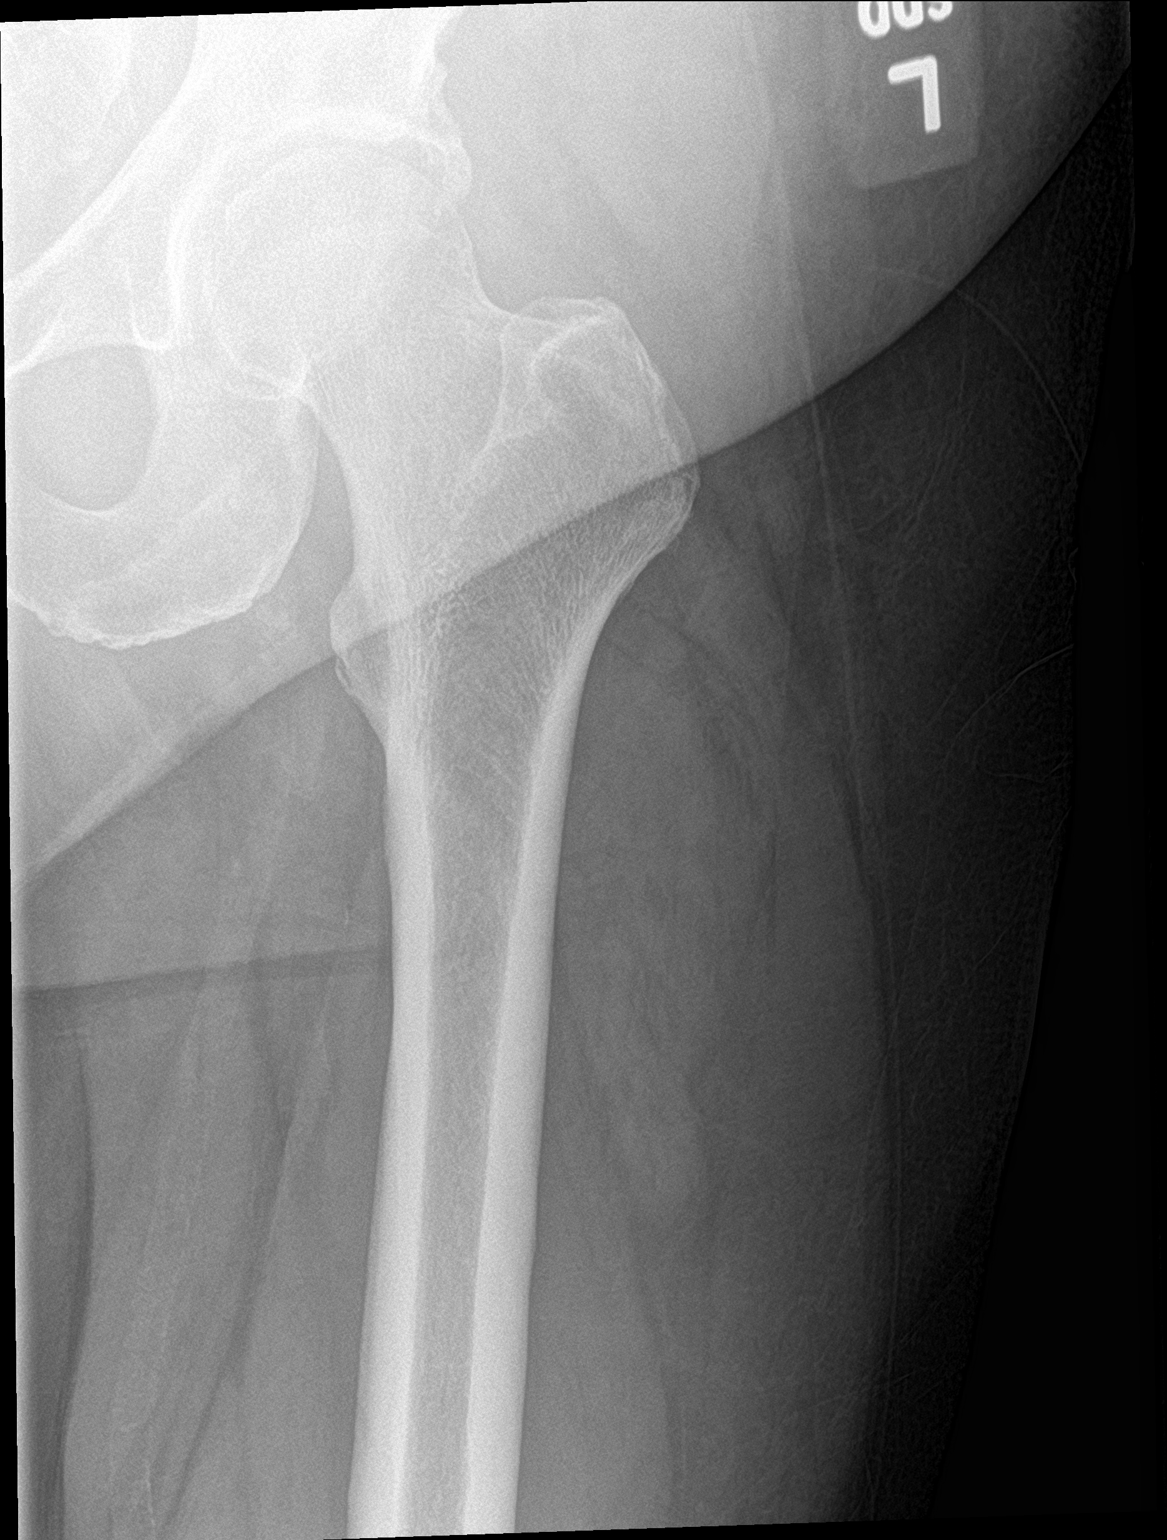

[hip lat]
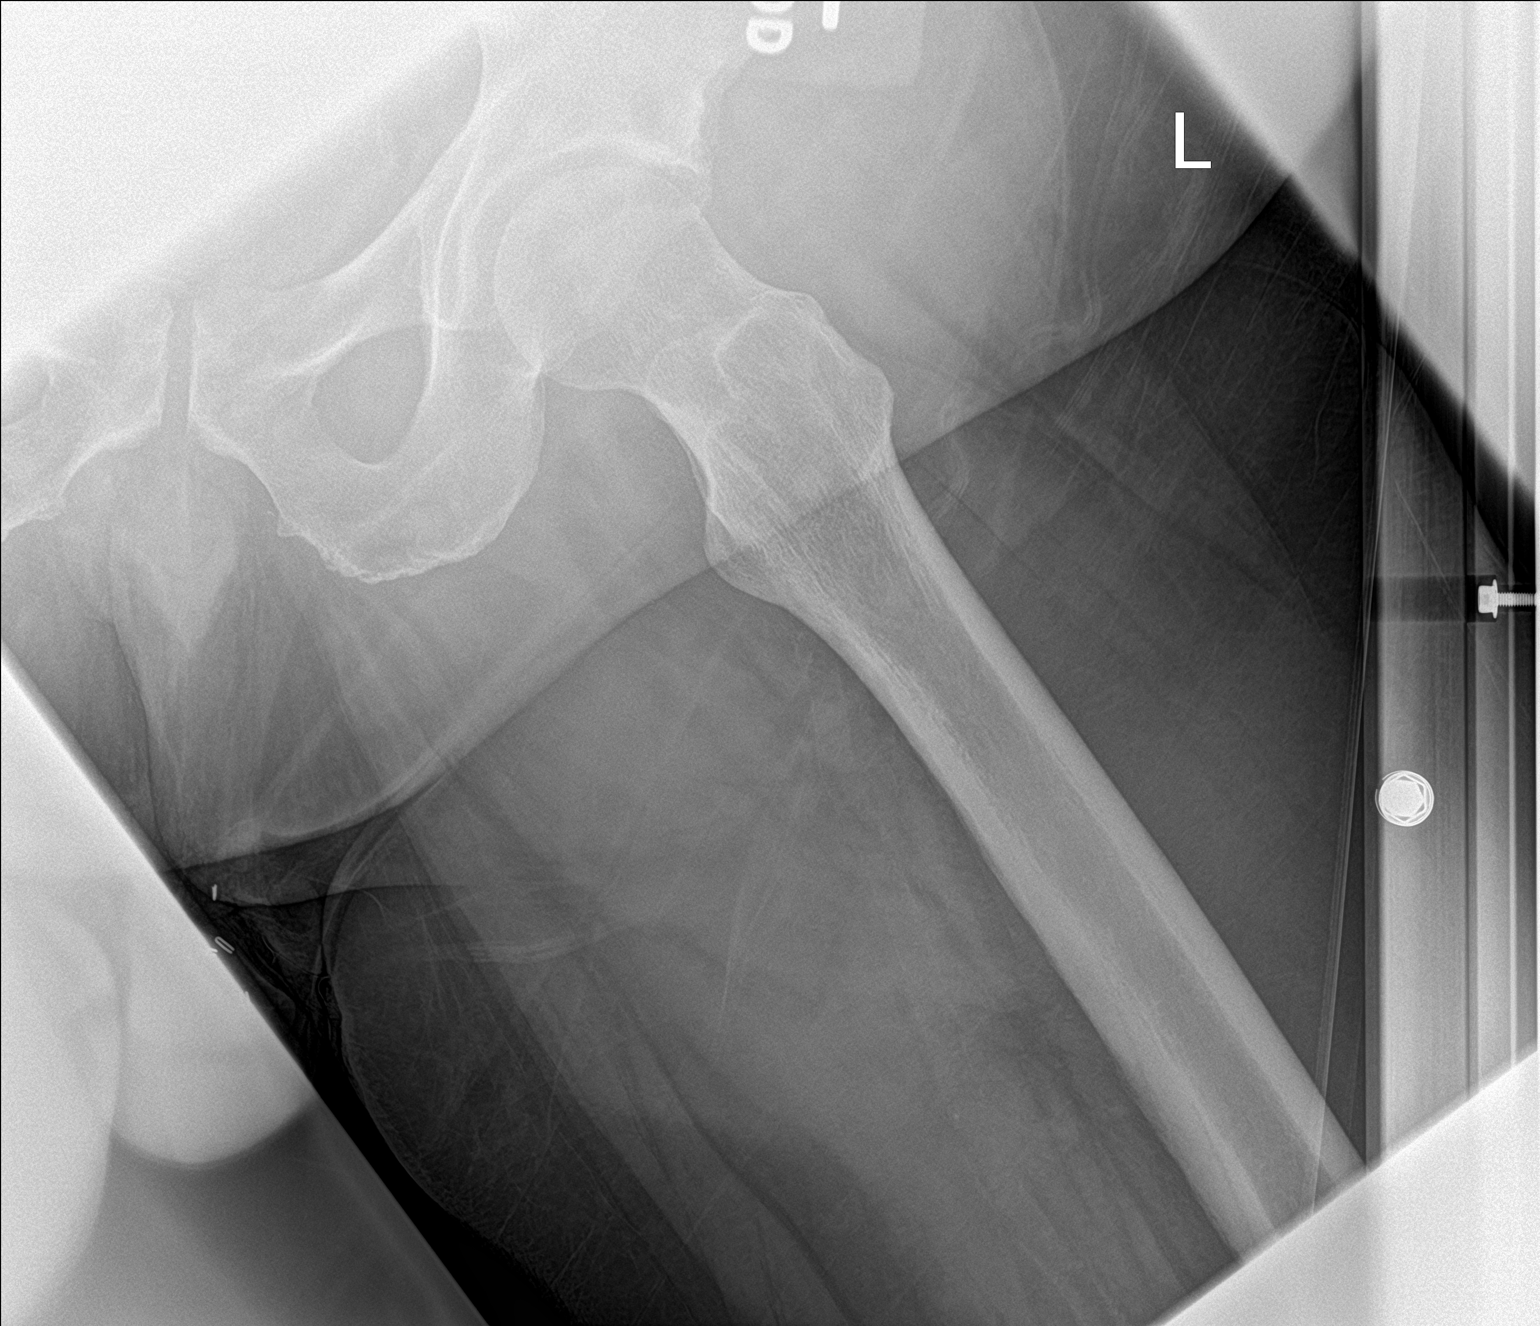

[pelvis ap]
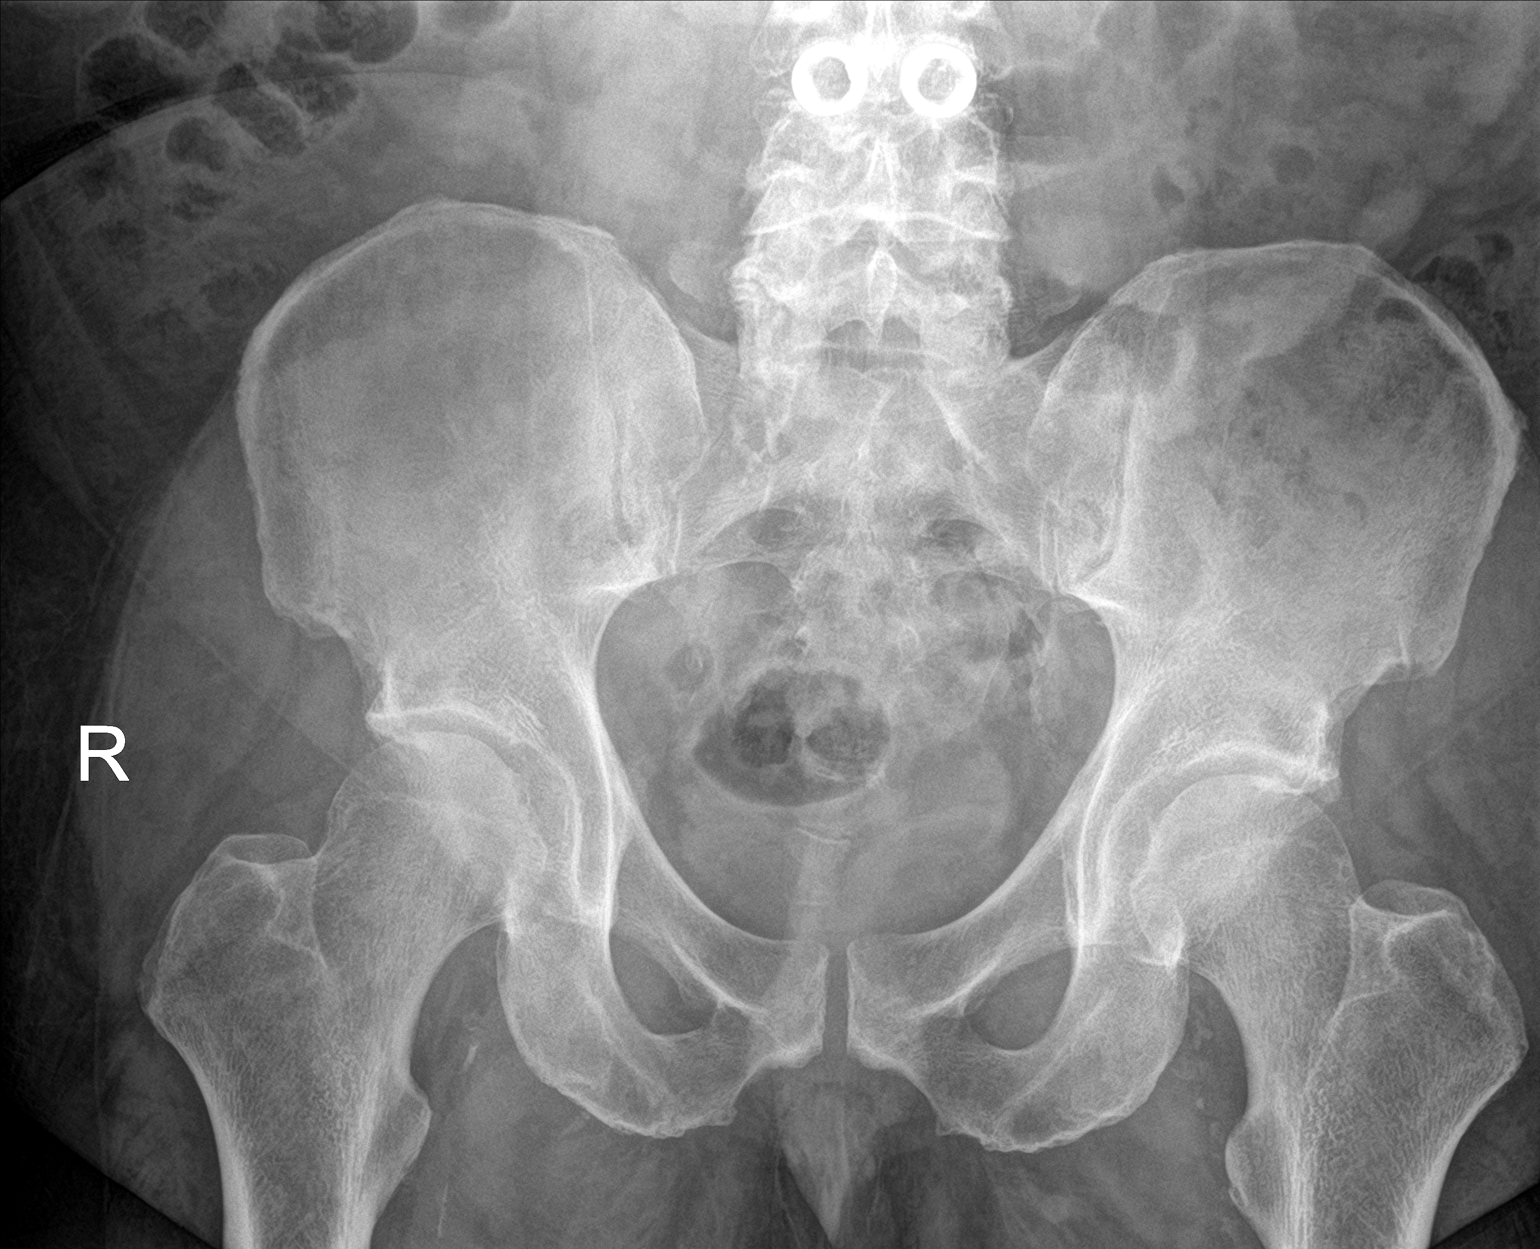

[3 of 3 positions shown; findings below may reference images not displayed]

FINDINGS: Lumbar spinal hardware. SI joints unremarkable. Eft hip joint
degenerative changes. Normal anatomic alignment. No evidence for
acute fracture or dislocation.
IMPRESSION: Mild left hip joint degenerative changes.

## 2019-01-01 DIAGNOSIS — G894 Chronic pain syndrome: Secondary | ICD-10-CM | POA: Diagnosis not present

## 2019-01-16 ENCOUNTER — Other Ambulatory Visit: Payer: Self-pay | Admitting: "Endocrinology

## 2019-01-22 DIAGNOSIS — Z1389 Encounter for screening for other disorder: Secondary | ICD-10-CM | POA: Diagnosis not present

## 2019-01-22 DIAGNOSIS — Z0001 Encounter for general adult medical examination with abnormal findings: Secondary | ICD-10-CM | POA: Diagnosis not present

## 2019-01-29 ENCOUNTER — Other Ambulatory Visit: Payer: Self-pay | Admitting: "Endocrinology

## 2019-02-19 DIAGNOSIS — J449 Chronic obstructive pulmonary disease, unspecified: Secondary | ICD-10-CM | POA: Diagnosis not present

## 2019-02-19 DIAGNOSIS — G8929 Other chronic pain: Secondary | ICD-10-CM | POA: Diagnosis not present

## 2019-03-03 ENCOUNTER — Other Ambulatory Visit: Payer: Self-pay | Admitting: "Endocrinology

## 2019-03-03 DIAGNOSIS — E118 Type 2 diabetes mellitus with unspecified complications: Secondary | ICD-10-CM | POA: Diagnosis not present

## 2019-03-03 DIAGNOSIS — E1159 Type 2 diabetes mellitus with other circulatory complications: Secondary | ICD-10-CM | POA: Diagnosis not present

## 2019-03-03 DIAGNOSIS — E039 Hypothyroidism, unspecified: Secondary | ICD-10-CM | POA: Diagnosis not present

## 2019-03-04 LAB — COMPREHENSIVE METABOLIC PANEL
ALT: 54 IU/L — ABNORMAL HIGH (ref 0–44)
AST: 46 IU/L — ABNORMAL HIGH (ref 0–40)
Albumin/Globulin Ratio: 1.8 (ref 1.2–2.2)
Albumin: 4.7 g/dL (ref 3.8–4.8)
Alkaline Phosphatase: 41 IU/L (ref 39–117)
BUN/Creatinine Ratio: 14 (ref 10–24)
BUN: 18 mg/dL (ref 8–27)
Bilirubin Total: 0.4 mg/dL (ref 0.0–1.2)
CO2: 20 mmol/L (ref 20–29)
Calcium: 11 mg/dL — ABNORMAL HIGH (ref 8.6–10.2)
Chloride: 99 mmol/L (ref 96–106)
Creatinine, Ser: 1.31 mg/dL — ABNORMAL HIGH (ref 0.76–1.27)
GFR calc Af Amer: 67 mL/min/{1.73_m2} (ref >59)
GFR calc non Af Amer: 58 mL/min/{1.73_m2} — ABNORMAL LOW (ref >59)
Globulin, Total: 2.6 g/dL (ref 1.5–4.5)
Glucose: 173 mg/dL — ABNORMAL HIGH (ref 65–99)
Potassium: 4.4 mmol/L (ref 3.5–5.2)
Sodium: 138 mmol/L (ref 134–144)
Total Protein: 7.3 g/dL (ref 6.0–8.5)

## 2019-03-04 LAB — HGB A1C W/O EAG: Hgb A1c MFr Bld: 9.3 % — ABNORMAL HIGH (ref 4.8–5.6)

## 2019-03-04 LAB — TSH: TSH: 3.07 u[IU]/mL (ref 0.450–4.500)

## 2019-03-04 LAB — T4, FREE: Free T4: 1.12 ng/dL (ref 0.82–1.77)

## 2019-03-05 ENCOUNTER — Ambulatory Visit (INDEPENDENT_AMBULATORY_CARE_PROVIDER_SITE_OTHER): Payer: Medicare Other | Admitting: "Endocrinology

## 2019-03-05 ENCOUNTER — Other Ambulatory Visit: Payer: Self-pay

## 2019-03-05 ENCOUNTER — Encounter: Payer: Self-pay | Admitting: "Endocrinology

## 2019-03-05 DIAGNOSIS — E039 Hypothyroidism, unspecified: Secondary | ICD-10-CM | POA: Diagnosis not present

## 2019-03-05 DIAGNOSIS — E782 Mixed hyperlipidemia: Secondary | ICD-10-CM

## 2019-03-05 DIAGNOSIS — I10 Essential (primary) hypertension: Secondary | ICD-10-CM | POA: Diagnosis not present

## 2019-03-05 DIAGNOSIS — E1159 Type 2 diabetes mellitus with other circulatory complications: Secondary | ICD-10-CM | POA: Diagnosis not present

## 2019-03-05 NOTE — Progress Notes (Signed)
03/05/2019                                                    Endocrinology Telehealth Visit Follow up Note -During COVID -19 Pandemic  This visit type was conducted due to national recommendations for restrictions regarding the COVID-19 Pandemic  in an effort to limit this patient's exposure and mitigate transmission of the corona virus.  Due to his co-morbid illnesses, [name] is at  moderate to high risk for complications without adequate follow up.  This format is felt to be most appropriate for him at this time.  I connected with this patient on 03/05/2019   by telephone and verified that I am speaking with the correct person using two identifiers. [name], 26-Dec-1955. he has verbally consented to this visit. All issues noted in this document were discussed and addressed. The format was not optimal for physical exam.    Subjective:    Patient ID: [name], male    DOB: 12-31-1955,    Past Medical History:  Diagnosis Date  . ASCVD (arteriosclerotic cardiovascular disease)    -MI in 01/2001 prompted CABG; EF-30% at cath; 50% on echo in 7/02 and normal in 2005  . Asthma   . Cerebrovascular disease     L CEA 06/2004 following left renal embolism; 10/2008 plaque w/o focal stenosis  . Chronic back pain   . Chronic leg pain   . Chronic obstructive pulmonary disease ([place])   . Degenerative joint disease     chronic LBP-s/p L3-4 fusion  . Diabetes mellitus    -no insulin  . Hyperlipidemia   . Hypertension   . Hypothyroidism   . Obesity   . Restless leg syndrome    Possible  . Tobacco abuse, in remission    50 pack years; discontinued in 2002   Past Surgical History:  Procedure Laterality Date  . CAROTID ENDARTERECTOMY  2005   Left  . CATARACT EXTRACTION     bilateral  . CORONARY ARTERY BYPASS GRAFT  2002  . LUMBAR SPINE SURGERY     L3-4 fusion   Social History   Socioeconomic History  . Marital status: Divorced    Spouse name: Not on file  .  Number of children: 1  . Years of education: Not on file  . Highest education level: Not on file  Occupational History  . Occupation: veteran    Comment: disabledf  Social Needs  . Financial resource strain: Not on file  . Food insecurity    Worry: Not on file    Inability: Not on file  . Transportation needs    Medical: Not on file    Non-medical: Not on file  Tobacco Use  . Smoking status: Former Smoker    Packs/day: 1.00    Years: 50.00    Pack years: 50.00    Types: Cigarettes    Quit date: 01/15/2001    Years since quitting: 18.1  . Smokeless tobacco: Never Used  Substance and Sexual Activity  . Alcohol use: No  . Drug use: No  . Sexual activity: Not on file  Lifestyle  . Physical activity    Days per week: Not on file    Minutes per session: Not on file  . Stress: Not on file  Relationships  . Social  connections    Talks on phone: Not on file    Gets together: Not on file    Attends religious service: Not on file    Active member of club or organization: Not on file    Attends meetings of clubs or organizations: Not on file    Relationship status: Not on file  Other Topics Concern  . Not on file  Social History Narrative  . Not on file   Outpatient Encounter Medications as of 03/05/2019  Medication Sig  . ACCU-CHEK AVIVA PLUS test strip USE AS DIRECTED UP TO 6 TIMES DAILY.  Marland Kitchen. Acetaminophen (TYLENOL ARTHRITIS EXT RELIEF PO) Take 1-2 tablets by mouth daily as needed (for pain).  Marland Kitchen. albuterol-ipratropium (COMBIVENT) 18-103 MCG/ACT inhaler Inhale 2 puffs into the lungs every 6 (six) hours as needed for wheezing or shortness of breath.   Marland Kitchen. aspirin 81 MG tablet Take 81 mg by mouth daily.    . B Complex-Biotin-FA (VITAMIN B50 COMPLEX PO) Take by mouth daily.  . Cholecalciferol (VITAMIN D PO) Take 1 capsule by mouth daily.  Marland Kitchen. docusate sodium (COLACE) 100 MG capsule Take 100 mg by mouth 2 (two) times daily.    Marland Kitchen. escitalopram (LEXAPRO) 20 MG tablet Take 20 mg by mouth  daily.    . fenofibrate 160 MG tablet Take 160 mg by mouth daily.    . fish oil-omega-3 fatty acids 1000 MG capsule Take 2 g by mouth daily.  Marland Kitchen. HYDROcodone-acetaminophen (NORCO) 10-325 MG per tablet Take 2 tablets by mouth.   . Insulin Glargine (LANTUS SOLOSTAR) 100 UNIT/ML Solostar Pen INJECT 80 UNITS SUBCUTANEOUSLY AT BEDTIME.  . insulin lispro (HUMALOG) 100 UNIT/ML KwikPen INJECT 10 TO 16 UNITS INTO THE SKIN 3 TIMES DAILY BEFORE MEALS.  Marland Kitchen. levothyroxine (SYNTHROID) 137 MCG tablet TAKE 1 TABLET BY MOUTH ONCE DAILY FOR THYROID.  . Multiple Vitamins-Minerals (MULTIVITAMIN WITH MINERALS) tablet Take 1 tablet by mouth daily.    . simvastatin (ZOCOR) 40 MG tablet Take 1 tablet (40 mg total) by mouth at bedtime.  . sitaGLIPtin (JANUVIA) 50 MG tablet Take 1 tablet (50 mg total) by mouth daily.  . SURE COMFORT PEN NEEDLES 31G X 8 MM MISC USE AS DIRECTED WITH LANTUS AND NOVOLOG UP TO FOUR TIMES DAILY.  . vitamin B-12 (CYANOCOBALAMIN) 100 MCG tablet Take 100 mcg by mouth daily.  . vitamin C (ASCORBIC ACID) 500 MG tablet Take 500 mg by mouth daily.   No facility-administered encounter medications on file as of 03/05/2019.    ALLERGIES: Allergies  Allergen Reactions  . Alprazolam Nausea And Vomiting and Other (See Comments)    Altered mental status  . Atorvastatin Nausea And Vomiting  . Gemfibrozil     unknown  . Invokana [Canagliflozin] Diarrhea and Nausea And Vomiting   VACCINATION STATUS: Immunization History  Administered Date(s) Administered  . Influenza-Unspecified 05/13/2014    Diabetes He presents for his follow-up diabetic visit. He has type 2 diabetes mellitus. Onset time: He was diagnosed at approximate age of 50 years. His disease course has been worsening. There are no hypoglycemic associated symptoms. Pertinent negatives for hypoglycemia include no confusion, headaches, pallor or seizures. Pertinent negatives for diabetes include no chest pain, no fatigue, no polydipsia, no  polyphagia, no polyuria and no weakness. There are no hypoglycemic complications. Symptoms are worsening. Diabetic complications include a CVA, heart disease and nephropathy. Risk factors for coronary artery disease include diabetes mellitus, dyslipidemia, hypertension, male sex and tobacco exposure. Current diabetic treatment includes insulin injections and  oral agent (monotherapy). He is compliant with treatment most of the time. His weight is fluctuating minimally. He is following a generally unhealthy diet. When asked about meal planning, he reported none. He has had a previous visit with a dietitian. He never participates in exercise. There is no change in his home blood glucose trend. His breakfast blood glucose range is generally 140-180 mg/dl. His lunch blood glucose range is generally 140-180 mg/dl. His dinner blood glucose range is generally 140-180 mg/dl. His bedtime blood glucose range is generally 140-180 mg/dl. His overall blood glucose range is 140-180 mg/dl. An ACE inhibitor/angiotensin II receptor blocker is being taken. Eye exam is current.  Hyperlipidemia This is a chronic problem. The current episode started more than 1 year ago. Exacerbating diseases include diabetes and obesity. Pertinent negatives include no chest pain, myalgias or shortness of breath. Current antihyperlipidemic treatment includes statins. Risk factors for coronary artery disease include family history, dyslipidemia, diabetes mellitus, hypertension, male sex, a sedentary lifestyle and obesity.     Objective:      Results for orders placed or performed in visit on 10/30/18  Comprehensive metabolic panel  Result Value Ref Range   Glucose 91 65 - 99 mg/dL   BUN 22 8 - 27 mg/dL   Creatinine, Ser 9.561.26 0.76 - 1.27 mg/dL   GFR calc non Af Amer 61 >59 mL/min/1.73   GFR calc Af Amer 70 >59 mL/min/1.73   BUN/Creatinine Ratio 17 10 - 24   Sodium 139 134 - 144 mmol/L   Potassium 4.4 3.5 - 5.2 mmol/L   Chloride 100 96  - 106 mmol/L   CO2 21 20 - 29 mmol/L   Calcium 10.0 8.6 - 10.2 mg/dL   Total Protein 7.1 6.0 - 8.5 g/dL   Albumin 4.6 3.8 - 4.8 g/dL   Globulin, Total 2.5 1.5 - 4.5 g/dL   Albumin/Globulin Ratio 1.8 1.2 - 2.2   Bilirubin Total 0.5 0.0 - 1.2 mg/dL   Alkaline Phosphatase 30 (L) 39 - 117 IU/L   AST 27 0 - 40 IU/L   ALT 37 0 - 44 IU/L  Hgb A1c w/o eAG  Result Value Ref Range   Hgb A1c MFr Bld 7.4 (H) 4.8 - 5.6 %   Lab Results  Component Value Date   WBC 9.1 09/27/2010   HGB 13.2 09/27/2010   HCT 42.7 09/27/2010   MCV 95.1 09/27/2010   PLT 174 09/27/2010    Lab Results  Component Value Date   HGBA1C 7.4 (H) 10/30/2018   HGBA1C 6.7 (H) 06/27/2018   HGBA1C 6.7 (H) 02/20/2018     Lipid Panel     Component Value Date/Time   CHOL 117 09/09/2018 0949   CHOL 181 07/17/2017 0757   TRIG 96 09/09/2018 0949   TRIG 329 10/02/2007   HDL 39 (L) 09/09/2018 0949   HDL 35 (L) 07/17/2017 0757   CHOLHDL 3.0 09/09/2018 0949   VLDL 19 09/09/2018 0949   LDLCALC 59 09/09/2018 0949   LDLCALC 108 (H) 07/17/2017 0757   LDLCALC 66 10/02/2007     Assessment & Plan:   1. Type 2 diabetes mellitus with stage 3 chronic kidney disease, with long-term current use of insulin    His diabetes is  complicated by  CAD, CKD.CVA, Retinopathy.  - His previsit labs show A1c of 7.4% increasing from 6.7% during his last visit.     Recent labs reviewed.  - Patient remains at a high risk for more acute and chronic complications  of diabetes which include CAD, CVA, CKD, retinopathy, and neuropathy. These are all discussed in detail with the patient.  -He will continue to require intensive treatment with basal/bolus insulin.   -He is advised to continue Lantus 80 units nightly, continue Humalog 10 units 3 times a day before meals for pre-meal blood glucose above 90 mg/dL plus correction associated with monitoring of glucose 4 times daily- before meals and at bedtime. -He would have benefited from continuous  glucose monitoring, however his insurance did not provide adequate coverage for this device.   -Patient is encouraged to call clinic for blood glucose levels less than 70 or above 300 mg /dl.  -Due to CKD patient is not a candidate for metformin , SGLT2i. -He is advised to continue Januvia 50 mg p.o. every morning with breakfast.   -He  does not tolerate metformin, and not appropriate candidate due to CKD.  -Target numbers for A1c, LDL, HDL, Triglycerides, Waist Circumference were discussed in detail.   2) Lipids/HPL: His recent lipid panel showed proving LDL at 67 from 108.  He is advised to continue simvastatin 40 mg p.o. nightly.    3) Hypothyroidism:   - His current thyroid function tests are consistent with appropriate replacement.   -He is advised to continue  levothyroxine at 137 g by mouth every morning.   - We discussed about the correct intake of his thyroid hormone, on empty stomach at fasting, with water, separated by at least 30 minutes from breakfast and other medications,  and separated by more than 4 hours from calcium, iron, multivitamins, acid reflux medications (PPIs). -Patient is made aware of the fact that thyroid hormone replacement is needed for life, dose to be adjusted by periodic monitoring of thyroid function tests.  I advised patient to maintain close follow up with his PCP for primary care needs.  - Patient Care Time Today:  25 min, of which >50% was spent in reviewing his  current and  previous labs/studies, previous treatments, his blood glucose readings, and medications doses and developing a plan for long-term care based on the latest recommendations for standards of care.  [name] participated in the discussions, expressed understanding, and voiced agreement with the above plans.  All questions were answered to his satisfaction. he is encouraged to contact clinic should he have any questions or concerns prior to his return visit.  Follow up  plan: Return in about 4 months (around 07/06/2019), or logs, for Follow up with Pre-visit Labs, Meter, and Logs.  Alan LunchGebre Nocole Zammit, MD Phone: [phone]  Fax: [phone]  This note was partially dictated with voice recognition software. Similar sounding words can be transcribed inadequately or may not  be corrected upon review.  03/05/2019, 1:51 PM

## 2019-03-06 ENCOUNTER — Other Ambulatory Visit: Payer: Self-pay | Admitting: "Endocrinology

## 2019-03-17 DIAGNOSIS — M67813 Other specified disorders of tendon, right shoulder: Secondary | ICD-10-CM | POA: Diagnosis not present

## 2019-03-17 DIAGNOSIS — G894 Chronic pain syndrome: Secondary | ICD-10-CM | POA: Diagnosis not present

## 2019-03-17 DIAGNOSIS — E1122 Type 2 diabetes mellitus with diabetic chronic kidney disease: Secondary | ICD-10-CM | POA: Diagnosis not present

## 2019-03-18 DIAGNOSIS — E1122 Type 2 diabetes mellitus with diabetic chronic kidney disease: Secondary | ICD-10-CM | POA: Diagnosis not present

## 2019-03-18 DIAGNOSIS — E118 Type 2 diabetes mellitus with unspecified complications: Secondary | ICD-10-CM | POA: Diagnosis not present

## 2019-04-09 DIAGNOSIS — G894 Chronic pain syndrome: Secondary | ICD-10-CM | POA: Diagnosis not present

## 2019-04-11 ENCOUNTER — Other Ambulatory Visit: Payer: Self-pay | Admitting: "Endocrinology

## 2019-05-06 DIAGNOSIS — G894 Chronic pain syndrome: Secondary | ICD-10-CM | POA: Diagnosis not present

## 2019-05-06 DIAGNOSIS — M5136 Other intervertebral disc degeneration, lumbar region: Secondary | ICD-10-CM | POA: Diagnosis not present

## 2019-05-18 ENCOUNTER — Other Ambulatory Visit: Payer: Self-pay | Admitting: "Endocrinology

## 2019-06-05 DIAGNOSIS — G894 Chronic pain syndrome: Secondary | ICD-10-CM | POA: Diagnosis not present

## 2019-06-05 DIAGNOSIS — Z23 Encounter for immunization: Secondary | ICD-10-CM | POA: Diagnosis not present

## 2019-06-06 ENCOUNTER — Other Ambulatory Visit: Payer: Self-pay | Admitting: "Endocrinology

## 2019-06-22 ENCOUNTER — Other Ambulatory Visit: Payer: Self-pay | Admitting: "Endocrinology

## 2019-07-01 ENCOUNTER — Other Ambulatory Visit: Payer: Self-pay | Admitting: "Endocrinology

## 2019-07-01 DIAGNOSIS — E1159 Type 2 diabetes mellitus with other circulatory complications: Secondary | ICD-10-CM | POA: Diagnosis not present

## 2019-07-01 DIAGNOSIS — E039 Hypothyroidism, unspecified: Secondary | ICD-10-CM | POA: Diagnosis not present

## 2019-07-01 DIAGNOSIS — E118 Type 2 diabetes mellitus with unspecified complications: Secondary | ICD-10-CM | POA: Diagnosis not present

## 2019-07-01 LAB — BASIC METABOLIC PANEL
BUN: 23 — AB (ref 4–21)
Creatinine: 1.2 (ref 0.6–1.3)
Potassium: 4.8 (ref 3.4–5.3)
Sodium: 138 (ref 137–147)

## 2019-07-01 LAB — HEMOGLOBIN A1C: Hemoglobin A1C: 11.1

## 2019-07-01 LAB — HEPATIC FUNCTION PANEL
ALT: 77 — AB (ref 10–40)
AST: 46 — AB (ref 14–40)

## 2019-07-01 LAB — VITAMIN D 25 HYDROXY (VIT D DEFICIENCY, FRACTURES): Vit D, 25-Hydroxy: 25.8

## 2019-07-01 LAB — COMPREHENSIVE METABOLIC PANEL
Albumin: 4.6 (ref 3.5–5.0)
Calcium: 10.8 — AB (ref 8.7–10.7)

## 2019-07-01 LAB — TSH: TSH: 7.2 — AB (ref 0.41–5.90)

## 2019-07-02 LAB — VITAMIN D 25 HYDROXY (VIT D DEFICIENCY, FRACTURES): Vit D, 25-Hydroxy: 25.8 ng/mL — ABNORMAL LOW (ref 30.0–100.0)

## 2019-07-02 LAB — COMPREHENSIVE METABOLIC PANEL
ALT: 77 IU/L — ABNORMAL HIGH (ref 0–44)
AST: 46 IU/L — ABNORMAL HIGH (ref 0–40)
Albumin/Globulin Ratio: 1.4 (ref 1.2–2.2)
Albumin: 4.6 g/dL (ref 3.8–4.8)
Alkaline Phosphatase: 62 IU/L (ref 39–117)
BUN/Creatinine Ratio: 19 (ref 10–24)
BUN: 23 mg/dL (ref 8–27)
Bilirubin Total: 0.4 mg/dL (ref 0.0–1.2)
CO2: 18 mmol/L — ABNORMAL LOW (ref 20–29)
Calcium: 10.8 mg/dL — ABNORMAL HIGH (ref 8.6–10.2)
Chloride: 98 mmol/L (ref 96–106)
Creatinine, Ser: 1.24 mg/dL (ref 0.76–1.27)
GFR calc Af Amer: 71 mL/min/{1.73_m2} (ref >59)
GFR calc non Af Amer: 61 mL/min/{1.73_m2} (ref >59)
Globulin, Total: 3.4 g/dL (ref 1.5–4.5)
Glucose: 374 mg/dL — ABNORMAL HIGH (ref 65–99)
Potassium: 4.8 mmol/L (ref 3.5–5.2)
Sodium: 138 mmol/L (ref 134–144)
Total Protein: 8 g/dL (ref 6.0–8.5)

## 2019-07-02 LAB — TSH: TSH: 7.2 u[IU]/mL — ABNORMAL HIGH (ref 0.450–4.500)

## 2019-07-02 LAB — HGB A1C W/O EAG: Hgb A1c MFr Bld: 11.1 % — ABNORMAL HIGH (ref 4.8–5.6)

## 2019-07-02 LAB — T4, FREE: Free T4: 1.17 ng/dL (ref 0.82–1.77)

## 2019-07-06 ENCOUNTER — Ambulatory Visit (INDEPENDENT_AMBULATORY_CARE_PROVIDER_SITE_OTHER): Payer: Medicare Other | Admitting: "Endocrinology

## 2019-07-06 ENCOUNTER — Other Ambulatory Visit: Payer: Self-pay

## 2019-07-06 ENCOUNTER — Encounter: Payer: Self-pay | Admitting: "Endocrinology

## 2019-07-06 DIAGNOSIS — E039 Hypothyroidism, unspecified: Secondary | ICD-10-CM

## 2019-07-06 DIAGNOSIS — E1159 Type 2 diabetes mellitus with other circulatory complications: Secondary | ICD-10-CM | POA: Diagnosis not present

## 2019-07-06 DIAGNOSIS — I10 Essential (primary) hypertension: Secondary | ICD-10-CM | POA: Diagnosis not present

## 2019-07-06 DIAGNOSIS — E782 Mixed hyperlipidemia: Secondary | ICD-10-CM | POA: Diagnosis not present

## 2019-07-06 MED ORDER — LEVOTHYROXINE SODIUM 150 MCG PO TABS: 150.0000 ug | ORAL_TABLET | Freq: Every day | ORAL | 2 refills | Status: DC

## 2019-07-06 MED ORDER — INSULIN LISPRO (1 UNIT DIAL) 100 UNIT/ML (KWIKPEN): 20.0000 [IU] | PEN_INJECTOR | Freq: Three times a day (TID) | SUBCUTANEOUS | 2 refills | Status: DC

## 2019-07-06 MED ORDER — LANTUS SOLOSTAR 100 UNIT/ML ~~LOC~~ SOPN: 100.0000 [IU] | PEN_INJECTOR | Freq: Every day | SUBCUTANEOUS | 2 refills | Status: DC

## 2019-07-06 NOTE — Progress Notes (Signed)
07/06/2019                                                    Endocrinology Telehealth Visit Follow up Note -During COVID -19 Pandemic  This visit type was conducted due to national recommendations for restrictions regarding the COVID-19 Pandemic  in an effort to limit this patient's exposure and mitigate transmission of the corona virus.  Due to his co-morbid illnesses, [name] is at  moderate to high risk for complications without adequate follow up.  This format is felt to be most appropriate for him at this time.  I connected with this patient on 07/06/2019   by telephone and verified that I am speaking with the correct person using two identifiers. [name], 07/16/1956. he has verbally consented to this visit. All issues noted in this document were discussed and addressed. The format was not optimal for physical exam.    Subjective:    Patient ID: [name], male    DOB: 02/13/1956,    Past Medical History:  Diagnosis Date  . ASCVD (arteriosclerotic cardiovascular disease)    -MI in 01/2001 prompted CABG; EF-30% at cath; 50% on echo in 7/02 and normal in 2005  . Asthma   . Cerebrovascular disease     L CEA 06/2004 following left renal embolism; 10/2008 plaque w/o focal stenosis  . Chronic back pain   . Chronic leg pain   . Chronic obstructive pulmonary disease ([place])   . Degenerative joint disease     chronic LBP-s/p L3-4 fusion  . Diabetes mellitus    -no insulin  . Hyperlipidemia   . Hypertension   . Hypothyroidism   . Obesity   . Restless leg syndrome    Possible  . Tobacco abuse, in remission    50 pack years; discontinued in 2002   Past Surgical History:  Procedure Laterality Date  . CAROTID ENDARTERECTOMY  2005   Left  . CATARACT EXTRACTION     bilateral  . CORONARY ARTERY BYPASS GRAFT  2002  . LUMBAR SPINE SURGERY     L3-4 fusion   Social History   Socioeconomic History  . Marital status: Divorced    Spouse name: Not on file  .  Number of children: 1  . Years of education: Not on file  . Highest education level: Not on file  Occupational History  . Occupation: veteran    Comment: disabledf  Social Needs  . Financial resource strain: Not on file  . Food insecurity    Worry: Not on file    Inability: Not on file  . Transportation needs    Medical: Not on file    Non-medical: Not on file  Tobacco Use  . Smoking status: Former Smoker    Packs/day: 1.00    Years: 50.00    Pack years: 50.00    Types: Cigarettes    Quit date: 01/15/2001    Years since quitting: 18.4  . Smokeless tobacco: Never Used  Substance and Sexual Activity  . Alcohol use: No  . Drug use: No  . Sexual activity: Not on file  Lifestyle  . Physical activity    Days per week: Not on file    Minutes per session: Not on file  . Stress: Not on file  Relationships  . Social  connections    Talks on phone: Not on file    Gets together: Not on file    Attends religious service: Not on file    Active member of club or organization: Not on file    Attends meetings of clubs or organizations: Not on file    Relationship status: Not on file  Other Topics Concern  . Not on file  Social History Narrative  . Not on file   Outpatient Encounter Medications as of 07/06/2019  Medication Sig  . ACCU-CHEK AVIVA PLUS test strip USE AS DIRECTED UP TO 6 TIMES DAILY.  Marland Kitchen Acetaminophen (TYLENOL ARTHRITIS EXT RELIEF PO) Take 1-2 tablets by mouth daily as needed (for pain).  Marland Kitchen albuterol-ipratropium (COMBIVENT) 18-103 MCG/ACT inhaler Inhale 2 puffs into the lungs every 6 (six) hours as needed for wheezing or shortness of breath.   Marland Kitchen aspirin 81 MG tablet Take 81 mg by mouth daily.    . B Complex-Biotin-FA (VITAMIN B50 COMPLEX PO) Take by mouth daily.  . Cholecalciferol (VITAMIN D PO) Take 1 capsule by mouth daily.  Marland Kitchen docusate sodium (COLACE) 100 MG capsule Take 100 mg by mouth 2 (two) times daily.    Marland Kitchen escitalopram (LEXAPRO) 20 MG tablet Take 20 mg by mouth  daily.    . fenofibrate 160 MG tablet Take 160 mg by mouth daily.    . fish oil-omega-3 fatty acids 1000 MG capsule Take 2 g by mouth daily.  Marland Kitchen HYDROcodone-acetaminophen (NORCO) 10-325 MG per tablet Take 2 tablets by mouth.   . Insulin Glargine (LANTUS SOLOSTAR) 100 UNIT/ML Solostar Pen Inject 100 Units into the skin at bedtime.  . insulin lispro (HUMALOG) 100 UNIT/ML KwikPen Inject 0.2-0.26 mLs (20-26 Units total) into the skin 3 (three) times daily before meals.  Marland Kitchen JANUVIA 50 MG tablet TAKE 1 TABLET BY MOUTH DAILY.  Marland Kitchen levothyroxine (SYNTHROID) 150 MCG tablet Take 1 tablet (150 mcg total) by mouth daily before breakfast.  . Multiple Vitamins-Minerals (MULTIVITAMIN WITH MINERALS) tablet Take 1 tablet by mouth daily.    . simvastatin (ZOCOR) 40 MG tablet Take 1 tablet (40 mg total) by mouth at bedtime.  . SURE COMFORT PEN NEEDLES 31G X 8 MM MISC USE AS DIRECTED WITH LANTUS AND NOVOLOG UP TO FOUR TIMES DAILY.  . vitamin B-12 (CYANOCOBALAMIN) 100 MCG tablet Take 100 mcg by mouth daily.  . vitamin C (ASCORBIC ACID) 500 MG tablet Take 500 mg by mouth daily.  . [DISCONTINUED] insulin lispro (HUMALOG) 100 UNIT/ML KwikPen INJECT 10 TO 16 UNITS INTO THE SKIN 3 TIMES DAILY BEFORE MEALS.  . [DISCONTINUED] LANTUS SOLOSTAR 100 UNIT/ML Solostar Pen INJECT 80 UNITS SUBCUTANEOUSLY AT BEDTIME.  . [DISCONTINUED] levothyroxine (SYNTHROID) 137 MCG tablet TAKE 1 TABLET BY MOUTH ONCE DAILY FOR THYROID.   No facility-administered encounter medications on file as of 07/06/2019.    ALLERGIES: Allergies  Allergen Reactions  . Alprazolam Nausea And Vomiting and Other (See Comments)    Altered mental status  . Atorvastatin Nausea And Vomiting  . Gemfibrozil     unknown  . Invokana [Canagliflozin] Diarrhea and Nausea And Vomiting   VACCINATION STATUS: Immunization History  Administered Date(s) Administered  . Influenza-Unspecified 05/13/2014    Diabetes He presents for his follow-up diabetic visit. He has  type 2 diabetes mellitus. Onset time: He was diagnosed at approximate age of 50 years. His disease course has been worsening. There are no hypoglycemic associated symptoms. Pertinent negatives for hypoglycemia include no confusion, headaches, pallor or seizures. Pertinent negatives for  diabetes include no chest pain, no fatigue, no polydipsia, no polyphagia, no polyuria and no weakness. There are no hypoglycemic complications. Symptoms are worsening. Diabetic complications include a CVA, heart disease and nephropathy. Risk factors for coronary artery disease include diabetes mellitus, dyslipidemia, hypertension, male sex and tobacco exposure. Current diabetic treatment includes insulin injections and oral agent (monotherapy). He is compliant with treatment most of the time. He is following a generally unhealthy diet. When asked about meal planning, he reported none. He has had a previous visit with a dietitian. He never participates in exercise. There is no change in his home blood glucose trend. His breakfast blood glucose range is generally >200 mg/dl. His lunch blood glucose range is generally >200 mg/dl. His dinner blood glucose range is generally >200 mg/dl. His bedtime blood glucose range is generally >200 mg/dl. His overall blood glucose range is >200 mg/dl. An ACE inhibitor/angiotensin II receptor blocker is being taken. Eye exam is current.  Hyperlipidemia This is a chronic problem. The current episode started more than 1 year ago. Exacerbating diseases include diabetes and obesity. Pertinent negatives include no chest pain, myalgias or shortness of breath. Current antihyperlipidemic treatment includes statins. Risk factors for coronary artery disease include family history, dyslipidemia, diabetes mellitus, hypertension, male sex, a sedentary lifestyle and obesity.     Objective:      Results for orders placed or performed in visit on 07/06/19  Basic metabolic panel  Result Value Ref Range    BUN 23 (A) 4 - 21   Creatinine 1.2 0.6 - 1.3   Potassium 4.8 3.4 - 5.3   Sodium 138 137 - 147  Comprehensive metabolic panel  Result Value Ref Range   Calcium 10.8 (A) 8.7 - 10.7   Albumin 4.6 3.5 - 5.0  Hepatic function panel  Result Value Ref Range   ALT 77 (A) 10 - 40   AST 46 (A) 14 - 40  Hemoglobin A1c  Result Value Ref Range   Hemoglobin A1C 11.1   VITAMIN D 25 Hydroxy (Vit-D Deficiency, Fractures)  Result Value Ref Range   Vit D, 25-Hydroxy 25.8   TSH  Result Value Ref Range   TSH 7.20 (A) 0.41 - 5.90   Lab Results  Component Value Date   WBC 9.1 09/27/2010   HGB 13.2 09/27/2010   HCT 42.7 09/27/2010   MCV 95.1 09/27/2010   PLT 174 09/27/2010    Lab Results  Component Value Date   HGBA1C 11.1 07/01/2019   HGBA1C 9.3 (H) 03/03/2019   HGBA1C 7.4 (H) 10/30/2018     Lipid Panel     Component Value Date/Time   CHOL 117 09/09/2018 0949   CHOL 181 07/17/2017 0757   TRIG 96 09/09/2018 0949   TRIG 329 10/02/2007   HDL 39 (L) 09/09/2018 0949   HDL 35 (L) 07/17/2017 0757   CHOLHDL 3.0 09/09/2018 0949   VLDL 19 09/09/2018 0949   LDLCALC 59 09/09/2018 0949   LDLCALC 108 (H) 07/17/2017 0757   LDLCALC 66 10/02/2007     Assessment & Plan:   1. Type 2 diabetes mellitus with stage 3 chronic kidney disease, with long-term current use of insulin    His diabetes is  complicated by  CAD, CKD.CVA, Retinopathy.  - He reports significantly above target glycemic profile and A1c of 11.1% increasing from 7.4%.      Recent labs reviewed.  - Patient remains at a high risk for more acute and chronic complications of diabetes which include CAD,  CVA, CKD, retinopathy, and neuropathy. These are all discussed in detail with the patient.  -He will continue to require intensive treatment with basal/bolus insulin.   -He is advised to increase Lantus to 100 units nightly, increase Humalog to 20  units 3 times a day before meals for pre-meal blood glucose above 90 mg/dL plus  correction associated with monitoring of glucose 4 times daily- before meals and at bedtime. -He would have benefited from continuous glucose monitoring, however his insurance did not provide adequate coverage for this device.   -Patient is encouraged to call clinic for blood glucose levels less than 70 or above 300 mg /dl.  -Due to CKD patient is not a candidate for metformin , SGLT2i. -He is advised to continue Januvia 50 mg p.o. every morning with breakfast.   -He  does not tolerate metformin, and not appropriate candidate due to CKD.  -Target numbers for A1c, LDL, HDL, Triglycerides, Waist Circumference were discussed in detail.   2) Lipids/HPL: His recent lipid panel showed proving LDL at 67 from 108.  He is advised to continue simvastatin 40 mg p.o. nightly.      3) Hypothyroidism:   - His current thyroid function tests are consistent with inadequate replacement.  I discussed and increase his levothyroxine to 150 mcg p.o. daily before breakfast.     - We discussed about the correct intake of his thyroid hormone, on empty stomach at fasting, with water, separated by at least 30 minutes from breakfast and other medications,  and separated by more than 4 hours from calcium, iron, multivitamins, acid reflux medications (PPIs). -Patient is made aware of the fact that thyroid hormone replacement is needed for life, dose to be adjusted by periodic monitoring of thyroid function tests.   I advised patient to maintain close follow up with his PCP for primary care needs.   - Patient Care Time Today:  25 min, of which >50% was spent in  counseling and the rest reviewing his  current and  previous labs/studies, previous treatments, his blood glucose readings, and medications' doses and developing a plan for long-term care based on the latest recommendations for standards of care.   Dierdre HighmanWilliam J Hern participated in the discussions, expressed understanding, and voiced agreement with the above  plans.  All questions were answered to his satisfaction. he is encouraged to contact clinic should he have any questions or concerns prior to his return visit.   Follow up plan: Return in about 2 weeks (around 07/20/2019) for Follow up with Meter and Logs Only - no Labs, Include 8 log sheets.  Marquis LunchGebre Mahmood Boehringer, MD Phone: [phone]  Fax: [phone]  This note was partially dictated with voice recognition software. Similar sounding words can be transcribed inadequately or may not  be corrected upon review.  07/06/2019, 4:58 PM

## 2019-07-08 DIAGNOSIS — M5136 Other intervertebral disc degeneration, lumbar region: Secondary | ICD-10-CM | POA: Diagnosis not present

## 2019-07-08 DIAGNOSIS — G894 Chronic pain syndrome: Secondary | ICD-10-CM | POA: Diagnosis not present

## 2019-07-14 DIAGNOSIS — M5136 Other intervertebral disc degeneration, lumbar region: Secondary | ICD-10-CM | POA: Diagnosis not present

## 2019-07-15 ENCOUNTER — Other Ambulatory Visit: Payer: Self-pay | Admitting: "Endocrinology

## 2019-07-23 ENCOUNTER — Ambulatory Visit (INDEPENDENT_AMBULATORY_CARE_PROVIDER_SITE_OTHER): Payer: Medicare Other | Admitting: "Endocrinology

## 2019-07-23 ENCOUNTER — Encounter: Payer: Self-pay | Admitting: "Endocrinology

## 2019-07-23 DIAGNOSIS — E1159 Type 2 diabetes mellitus with other circulatory complications: Secondary | ICD-10-CM | POA: Diagnosis not present

## 2019-07-23 DIAGNOSIS — E039 Hypothyroidism, unspecified: Secondary | ICD-10-CM

## 2019-07-23 DIAGNOSIS — E782 Mixed hyperlipidemia: Secondary | ICD-10-CM | POA: Diagnosis not present

## 2019-07-23 DIAGNOSIS — I10 Essential (primary) hypertension: Secondary | ICD-10-CM | POA: Diagnosis not present

## 2019-07-23 NOTE — Progress Notes (Signed)
07/23/2019                                                    Endocrinology Telehealth Visit Follow up Note -During COVID -19 Pandemic  This visit type was conducted due to national recommendations for restrictions regarding the COVID-19 Pandemic  in an effort to limit this patient's exposure and mitigate transmission of the corona virus.  Due to his co-morbid illnesses, [name] is at  moderate to high risk for complications without adequate follow up.  This format is felt to be most appropriate for him at this time.  I connected with this patient on 07/23/2019   by telephone and verified that I am speaking with the correct person using two identifiers. [name], 05-Apr-1956. he has verbally consented to this visit. All issues noted in this document were discussed and addressed. The format was not optimal for physical exam.    Subjective:    Patient ID: [name], male    DOB: [birth year],    Past Medical History:  Diagnosis Date  . ASCVD (arteriosclerotic cardiovascular disease)    -MI in 01/2001 prompted CABG; EF-30% at cath; 50% on echo in 7/02 and normal in 2005  . Asthma   . Cerebrovascular disease     L CEA 06/2004 following left renal embolism; 10/2008 plaque w/o focal stenosis  . Chronic back pain   . Chronic leg pain   . Chronic obstructive pulmonary disease (HCC)   . Degenerative joint disease     chronic LBP-s/p L3-4 fusion  . Diabetes mellitus    -no insulin  . Hyperlipidemia   . Hypertension   . Hypothyroidism   . Obesity   . Restless leg syndrome    Possible  . Tobacco abuse, in remission    50 pack years; discontinued in 2002   Past Surgical History:  Procedure Laterality Date  . CAROTID ENDARTERECTOMY  2005   Left  . CATARACT EXTRACTION     bilateral  . CORONARY ARTERY BYPASS GRAFT  2002  . LUMBAR SPINE SURGERY     L3-4 fusion   Social History   Socioeconomic History  . Marital status: Divorced    Spouse name: Not on file  .  Number of children: 1  . Years of education: Not on file  . Highest education level: Not on file  Occupational History  . Occupation: veteran    Comment: disabledf  Tobacco Use  . Smoking status: Former Smoker    Packs/day: 1.00    Years: 50.00    Pack years: 50.00    Types: Cigarettes    Quit date: 01/15/2001    Years since quitting: 18.5  . Smokeless tobacco: Never Used  Substance and Sexual Activity  . Alcohol use: No  . Drug use: No  . Sexual activity: Not on file  Other Topics Concern  . Not on file  Social History Narrative  . Not on file   Social Determinants of Health   Financial Resource Strain:   . Difficulty of Paying Living Expenses: Not on file  Food Insecurity:   . Worried About Programme researcher, broadcasting/film/video in the Last Year: Not on file  . Ran Out of Food in the Last Year: Not on file  Transportation Needs:   . Lack of Transportation (Medical):  Not on file  . Lack of Transportation (Non-Medical): Not on file  Physical Activity:   . Days of Exercise per Week: Not on file  . Minutes of Exercise per Session: Not on file  Stress:   . Feeling of Stress : Not on file  Social Connections:   . Frequency of Communication with Friends and Family: Not on file  . Frequency of Social Gatherings with Friends and Family: Not on file  . Attends Religious Services: Not on file  . Active Member of Clubs or Organizations: Not on file  . Attends Banker Meetings: Not on file  . Marital Status: Not on file   Outpatient Encounter Medications as of 07/23/2019  Medication Sig  . ACCU-CHEK AVIVA PLUS test strip USE AS DIRECTED UP TO 6 TIMES DAILY.  Marland Kitchen Acetaminophen (TYLENOL ARTHRITIS EXT RELIEF PO) Take 1-2 tablets by mouth daily as needed (for pain).  Marland Kitchen albuterol-ipratropium (COMBIVENT) 18-103 MCG/ACT inhaler Inhale 2 puffs into the lungs every 6 (six) hours as needed for wheezing or shortness of breath.   Marland Kitchen aspirin 81 MG tablet Take 81 mg by mouth daily.    . B  Complex-Biotin-FA (VITAMIN B50 COMPLEX PO) Take by mouth daily.  . Cholecalciferol (VITAMIN D PO) Take 1 capsule by mouth daily.  Marland Kitchen docusate sodium (COLACE) 100 MG capsule Take 100 mg by mouth 2 (two) times daily.    Marland Kitchen escitalopram (LEXAPRO) 20 MG tablet Take 20 mg by mouth daily.    . fenofibrate 160 MG tablet Take 160 mg by mouth daily.    . fish oil-omega-3 fatty acids 1000 MG capsule Take 2 g by mouth daily.  Marland Kitchen HYDROcodone-acetaminophen (NORCO) 10-325 MG per tablet Take 2 tablets by mouth.   . Insulin Glargine (LANTUS SOLOSTAR) 100 UNIT/ML Solostar Pen Inject 100 Units into the skin at bedtime.  . insulin lispro (HUMALOG) 100 UNIT/ML KwikPen Inject 0.2-0.26 mLs (20-26 Units total) into the skin 3 (three) times daily before meals.  Marland Kitchen JANUVIA 50 MG tablet TAKE 1 TABLET BY MOUTH DAILY.  Marland Kitchen levothyroxine (SYNTHROID) 150 MCG tablet Take 1 tablet (150 mcg total) by mouth daily before breakfast.  . Multiple Vitamins-Minerals (MULTIVITAMIN WITH MINERALS) tablet Take 1 tablet by mouth daily.    . simvastatin (ZOCOR) 40 MG tablet Take 1 tablet (40 mg total) by mouth at bedtime.  . SURE COMFORT PEN NEEDLES 31G X 8 MM MISC USE AS DIRECTED WITH LANTUS AND NOVOLOG UP TO FOUR TIMES DAILY.  . vitamin B-12 (CYANOCOBALAMIN) 100 MCG tablet Take 100 mcg by mouth daily.  . vitamin C (ASCORBIC ACID) 500 MG tablet Take 500 mg by mouth daily.   No facility-administered encounter medications on file as of 07/23/2019.   ALLERGIES: Allergies  Allergen Reactions  . Alprazolam Nausea And Vomiting and Other (See Comments)    Altered mental status  . Atorvastatin Nausea And Vomiting  . Gemfibrozil     unknown  . Invokana [Canagliflozin] Diarrhea and Nausea And Vomiting   VACCINATION STATUS: Immunization History  Administered Date(s) Administered  . Influenza-Unspecified 05/13/2014    Diabetes He presents for his follow-up diabetic visit. He has type 2 diabetes mellitus. Onset time: He was diagnosed at  approximate age of 50 years. His disease course has been improving. There are no hypoglycemic associated symptoms. Pertinent negatives for hypoglycemia include no confusion, headaches, pallor or seizures. Pertinent negatives for diabetes include no chest pain, no fatigue, no polydipsia, no polyphagia, no polyuria and no weakness. There are no  hypoglycemic complications. Symptoms are improving. Diabetic complications include a CVA, heart disease and nephropathy. Risk factors for coronary artery disease include diabetes mellitus, dyslipidemia, hypertension, male sex and tobacco exposure. Current diabetic treatment includes insulin injections and oral agent (monotherapy). He is compliant with treatment most of the time. His weight is fluctuating minimally. He is following a diabetic diet. He has had a previous visit with a dietitian. He never participates in exercise. His home blood glucose trend is decreasing steadily. His breakfast blood glucose range is generally 180-200 mg/dl. His lunch blood glucose range is generally 180-200 mg/dl. His dinner blood glucose range is generally 180-200 mg/dl. His bedtime blood glucose range is generally 180-200 mg/dl. His overall blood glucose range is 180-200 mg/dl. An ACE inhibitor/angiotensin II receptor blocker is being taken. Eye exam is current.  Hyperlipidemia This is a chronic problem. The current episode started more than 1 year ago. Exacerbating diseases include diabetes and obesity. Pertinent negatives include no chest pain, myalgias or shortness of breath. Current antihyperlipidemic treatment includes statins. Risk factors for coronary artery disease include family history, dyslipidemia, diabetes mellitus, hypertension, male sex, a sedentary lifestyle and obesity.  Thyroid Problem Presents for follow-up (He has longstanding hypothyroidism, currently on levothyroxine 150 mcg p.o. daily before breakfast.  He reports compliance with medication.) visit. Patient reports  no fatigue. His past medical history is significant for diabetes and hyperlipidemia.     Objective:      Results for orders placed or performed in visit on 98/92/11  Basic metabolic panel  Result Value Ref Range   BUN 23 (A) 4 - 21   Creatinine 1.2 0.6 - 1.3   Potassium 4.8 3.4 - 5.3   Sodium 138 137 - 147  Comprehensive metabolic panel  Result Value Ref Range   Calcium 10.8 (A) 8.7 - 10.7   Albumin 4.6 3.5 - 5.0  Hepatic function panel  Result Value Ref Range   ALT 77 (A) 10 - 40   AST 46 (A) 14 - 40  Hemoglobin A1c  Result Value Ref Range   Hemoglobin A1C 11.1   VITAMIN D 25 Hydroxy (Vit-D Deficiency, Fractures)  Result Value Ref Range   Vit D, 25-Hydroxy 25.8   TSH  Result Value Ref Range   TSH 7.20 (A) 0.41 - 5.90   Lab Results  Component Value Date   WBC 9.1 09/27/2010   HGB 13.2 09/27/2010   HCT 42.7 09/27/2010   MCV 95.1 09/27/2010   PLT 174 09/27/2010    Lab Results  Component Value Date   HGBA1C 11.1 (H) 07/01/2019   HGBA1C 11.1 07/01/2019   HGBA1C 9.3 (H) 03/03/2019     Lipid Panel     Component Value Date/Time   CHOL 117 09/09/2018 0949   CHOL 181 07/17/2017 0757   TRIG 96 09/09/2018 0949   TRIG 329 10/02/2007 0000   HDL 39 (L) 09/09/2018 0949   HDL 35 (L) 07/17/2017 0757   CHOLHDL 3.0 09/09/2018 0949   VLDL 19 09/09/2018 0949   LDLCALC 59 09/09/2018 0949   LDLCALC 108 (H) 07/17/2017 0757   LDLCALC 66 10/02/2007 0000     Assessment & Plan:   1. Type 2 diabetes mellitus with stage 3 chronic kidney disease, with long-term current use of insulin    His diabetes is  complicated by  CAD, CKD.CVA, Retinopathy.  -Since his last visit, he reports significantly improved glycemic profile on a higher dose of basal/bolus insulin.    His most recent A1c was  11.1%, increasing from 7.4%.     Recent labs reviewed.  - Patient remains at a high risk for more acute and chronic complications of diabetes which include CAD, CVA, CKD, retinopathy, and  neuropathy. These are all discussed in detail with the patient.  -He will continue to require intensive treatment with basal/bolus insulin in order for him to achieve and maintain control of diabetes to target.   -He is advised to continue Lantus 100 units nightly, Humalog 20 units 3 times a day before meals for pre-meal blood glucose above 90 mg/dL plus correction associated with monitoring of glucose 4 times daily- before meals and at bedtime. -He would have benefited from continuous glucose monitoring, however his insurance did not provide adequate coverage for this device.   -Patient is encouraged to call clinic for blood glucose levels less than 70 or above 300 mg /dl.  -Due to CKD patient is not a candidate for metformin , SGLT2i. -He is advised to continue Januvia 50 mg p.o. every morning with breakfast.   -He  does not tolerate metformin. -Target numbers for A1c, LDL, HDL, Triglycerides, Waist Circumference were discussed in detail.   2) Lipids/HPL: His most recent lipid panel showed controlled LDL at 67 improving from 108.   He is advised to continue simvastatin 40 mg p.o. nightly.      3) Hypothyroidism:   -His levothyroxine was recently adjusted due to abnormal thyroid function tests.  He is advised to continue  levothyroxine 150 mcg p.o. daily before breakfast.     - We discussed about the correct intake of his thyroid hormone, on empty stomach at fasting, with water, separated by at least 30 minutes from breakfast and other medications,  and separated by more than 4 hours from calcium, iron, multivitamins, acid reflux medications (PPIs). -Patient is made aware of the fact that thyroid hormone replacement is needed for life, dose to be adjusted by periodic monitoring of thyroid function tests.  I advised patient to maintain close follow up with his PCP for primary care needs.   - Patient Care Time Today:  25 min, of which >50% was spent in  counseling and the rest reviewing his   current and  previous labs/studies, previous treatments, his blood glucose readings, and medications' doses and developing a plan for long-term care based on the latest recommendations for standards of care.   Alan HighmanWilliam J Shiveley participated in the discussions, expressed understanding, and voiced agreement with the above plans.  All questions were answered to his satisfaction. he is encouraged to contact clinic should he have any questions or concerns prior to his return visit.    Follow up plan: Return in about 3 months (around 10/21/2019) for Bring Meter and Logs- A1c in Office, Include 8 log sheets.  Marquis LunchGebre Nikko Goldwire, MD Phone: [phone]  Fax: [phone]  This note was partially dictated with voice recognition software. Similar sounding words can be transcribed inadequately or may not  be corrected upon review.  07/23/2019, 10:50 AM

## 2019-07-23 NOTE — Patient Instructions (Signed)

## 2019-08-05 DIAGNOSIS — J449 Chronic obstructive pulmonary disease, unspecified: Secondary | ICD-10-CM | POA: Diagnosis not present

## 2019-08-05 DIAGNOSIS — G894 Chronic pain syndrome: Secondary | ICD-10-CM | POA: Diagnosis not present

## 2019-08-11 ENCOUNTER — Other Ambulatory Visit: Payer: Self-pay | Admitting: "Endocrinology

## 2019-09-02 DIAGNOSIS — G8929 Other chronic pain: Secondary | ICD-10-CM | POA: Diagnosis not present

## 2019-09-02 DIAGNOSIS — Z0001 Encounter for general adult medical examination with abnormal findings: Secondary | ICD-10-CM | POA: Diagnosis not present

## 2019-09-02 DIAGNOSIS — Z1389 Encounter for screening for other disorder: Secondary | ICD-10-CM | POA: Diagnosis not present

## 2019-09-02 DIAGNOSIS — E039 Hypothyroidism, unspecified: Secondary | ICD-10-CM | POA: Diagnosis not present

## 2019-09-02 DIAGNOSIS — G2581 Restless legs syndrome: Secondary | ICD-10-CM | POA: Diagnosis not present

## 2019-09-02 DIAGNOSIS — Z0000 Encounter for general adult medical examination without abnormal findings: Secondary | ICD-10-CM | POA: Diagnosis not present

## 2019-10-06 ENCOUNTER — Other Ambulatory Visit: Payer: Self-pay | Admitting: "Endocrinology

## 2019-10-14 DIAGNOSIS — G894 Chronic pain syndrome: Secondary | ICD-10-CM | POA: Diagnosis not present

## 2019-10-14 DIAGNOSIS — E1165 Type 2 diabetes mellitus with hyperglycemia: Secondary | ICD-10-CM | POA: Diagnosis not present

## 2019-10-14 LAB — HEMOGLOBIN A1C: Hemoglobin A1C: 9.5

## 2019-10-21 ENCOUNTER — Other Ambulatory Visit: Payer: Self-pay | Admitting: "Endocrinology

## 2019-10-22 ENCOUNTER — Ambulatory Visit (INDEPENDENT_AMBULATORY_CARE_PROVIDER_SITE_OTHER): Payer: Medicare Other | Admitting: "Endocrinology

## 2019-10-22 ENCOUNTER — Encounter: Payer: Self-pay | Admitting: "Endocrinology

## 2019-10-22 ENCOUNTER — Other Ambulatory Visit: Payer: Self-pay

## 2019-10-22 VITALS — BP 95/77 | HR 83 | Ht 69.0 in | Wt 294.2 lb

## 2019-10-22 DIAGNOSIS — E1159 Type 2 diabetes mellitus with other circulatory complications: Secondary | ICD-10-CM

## 2019-10-22 DIAGNOSIS — I10 Essential (primary) hypertension: Secondary | ICD-10-CM | POA: Diagnosis not present

## 2019-10-22 DIAGNOSIS — E782 Mixed hyperlipidemia: Secondary | ICD-10-CM

## 2019-10-22 DIAGNOSIS — E039 Hypothyroidism, unspecified: Secondary | ICD-10-CM

## 2019-10-22 MED ORDER — FREESTYLE LIBRE 14 DAY READER DEVI: 1.0000 | Freq: Once | 0 refills | Status: AC

## 2019-10-22 MED ORDER — FREESTYLE LIBRE 14 DAY SENSOR MISC: 1.0000 | 2 refills | Status: DC

## 2019-10-22 MED ORDER — GLIPIZIDE ER 5 MG PO TB24: 5.0000 mg | ORAL_TABLET | Freq: Every day | ORAL | 3 refills | Status: DC

## 2019-10-22 NOTE — Patient Instructions (Signed)

## 2019-10-22 NOTE — Progress Notes (Signed)
10/22/2019   Endocrinology follow-up note   Subjective:    Patient ID: [name], male    DOB: February 06, 1956,    Past Medical History:  Diagnosis Date  . ASCVD (arteriosclerotic cardiovascular disease)    -MI in 01/2001 prompted CABG; EF-30% at cath; 50% on echo in 7/02 and normal in 2005  . Asthma   . Cerebrovascular disease     L CEA 06/2004 following left renal embolism; 10/2008 plaque w/o focal stenosis  . Chronic back pain   . Chronic leg pain   . Chronic obstructive pulmonary disease ([place])   . Degenerative joint disease     chronic LBP-s/p L3-4 fusion  . Diabetes mellitus    -no insulin  . Hyperlipidemia   . Hypertension   . Hypothyroidism   . Obesity   . Restless leg syndrome    Possible  . Tobacco abuse, in remission    50 pack years; discontinued in 2002   Past Surgical History:  Procedure Laterality Date  . CAROTID ENDARTERECTOMY  2005   Left  . CATARACT EXTRACTION     bilateral  . CORONARY ARTERY BYPASS GRAFT  2002  . LUMBAR SPINE SURGERY     L3-4 fusion   Social History   Socioeconomic History  . Marital status: Divorced    Spouse name: Not on file  . Number of children: 1  . Years of education: Not on file  . Highest education level: Not on file  Occupational History  . Occupation: veteran    Comment: disabledf  Tobacco Use  . Smoking status: Former Smoker    Packs/day: 1.00    Years: 50.00    Pack years: 50.00    Types: Cigarettes    Quit date: 01/15/2001    Years since quitting: 18.7  . Smokeless tobacco: Never Used  Substance and Sexual Activity  . Alcohol use: No  . Drug use: No  . Sexual activity: Not on file  Other Topics Concern  . Not on file  Social History Narrative  . Not on file   Social Determinants of Health   Financial Resource Strain:   . Difficulty of Paying Living Expenses:   Food Insecurity:   . Worried About Charity fundraiser in the Last Year:   . Arboriculturist in the Last Year:   Transportation  Needs:   . Film/video editor (Medical):   Marland Kitchen Lack of Transportation (Non-Medical):   Physical Activity:   . Days of Exercise per Week:   . Minutes of Exercise per Session:   Stress:   . Feeling of Stress :   Social Connections:   . Frequency of Communication with Friends and Family:   . Frequency of Social Gatherings with Friends and Family:   . Attends Religious Services:   . Active Member of Clubs or Organizations:   . Attends Archivist Meetings:   Marland Kitchen Marital Status:    Outpatient Encounter Medications as of 10/22/2019  Medication Sig  . ACCU-CHEK AVIVA PLUS test strip USE AS DIRECTED UP TO 6 TIMES DAILY.  Marland Kitchen Acetaminophen (TYLENOL ARTHRITIS EXT RELIEF PO) Take 1-2 tablets by mouth daily as needed (for pain).  Marland Kitchen albuterol-ipratropium (COMBIVENT) 18-103 MCG/ACT inhaler Inhale 2 puffs into the lungs every 6 (six) hours as needed for wheezing or shortness of breath.   Marland Kitchen aspirin 81 MG tablet Take 81 mg by mouth daily.    . B Complex-Biotin-FA (VITAMIN B50 COMPLEX PO) Take by  mouth daily.  . Cholecalciferol (VITAMIN D PO) Take 1 capsule by mouth daily.  . Continuous Blood Gluc Receiver (FREESTYLE LIBRE 14 DAY READER) DEVI 1 each by Does not apply route once for 1 dose.  . Continuous Blood Gluc Sensor (FREESTYLE LIBRE 14 DAY SENSOR) MISC Inject 1 each into the skin every 14 (fourteen) days. Use as directed.  . docusate sodium (COLACE) 100 MG capsule Take 100 mg by mouth 2 (two) times daily.    Marland Kitchen escitalopram (LEXAPRO) 20 MG tablet Take 20 mg by mouth daily.    . fenofibrate 160 MG tablet Take 160 mg by mouth daily.    . fish oil-omega-3 fatty acids 1000 MG capsule Take 2 g by mouth daily.  Marland Kitchen glipiZIDE (GLUCOTROL XL) 5 MG 24 hr tablet Take 1 tablet (5 mg total) by mouth daily with breakfast.  . HYDROcodone-acetaminophen (NORCO) 10-325 MG per tablet Take 2 tablets by mouth.   . Insulin Glargine (LANTUS SOLOSTAR) 100 UNIT/ML Solostar Pen Inject 100 Units into the skin at bedtime.   . insulin lispro (HUMALOG) 100 UNIT/ML KwikPen INJECT 20 TO 26 UNITS INTO THE SKIN 3 TIMES DAILY BEFORE MEALS.  Marland Kitchen JANUVIA 50 MG tablet TAKE 1 TABLET BY MOUTH DAILY.  Marland Kitchen levothyroxine (SYNTHROID) 150 MCG tablet Take 1 tablet (150 mcg total) by mouth daily before breakfast.  . Multiple Vitamins-Minerals (MULTIVITAMIN WITH MINERALS) tablet Take 1 tablet by mouth daily.    . simvastatin (ZOCOR) 40 MG tablet Take 1 tablet (40 mg total) by mouth at bedtime.  . SURE COMFORT PEN NEEDLES 31G X 8 MM MISC USE AS DIRECTED WITH LANTUS AND NOVOLOG UP TO FOUR TIMES DAILY.  . vitamin B-12 (CYANOCOBALAMIN) 100 MCG tablet Take 100 mcg by mouth daily.  . vitamin C (ASCORBIC ACID) 500 MG tablet Take 500 mg by mouth daily.   No facility-administered encounter medications on file as of 10/22/2019.   ALLERGIES: Allergies  Allergen Reactions  . Alprazolam Nausea And Vomiting and Other (See Comments)    Altered mental status  . Atorvastatin Nausea And Vomiting  . Gemfibrozil     unknown  . Invokana [Canagliflozin] Diarrhea and Nausea And Vomiting   VACCINATION STATUS: Immunization History  Administered Date(s) Administered  . Influenza-Unspecified 05/13/2014    Diabetes He presents for his follow-up diabetic visit. He has type 2 diabetes mellitus. Onset time: He was diagnosed at approximate age of 50 years. His disease course has been improving. There are no hypoglycemic associated symptoms. Pertinent negatives for hypoglycemia include no confusion, headaches, pallor or seizures. Associated symptoms include polydipsia and polyuria. Pertinent negatives for diabetes include no chest pain, no fatigue, no polyphagia and no weakness. There are no hypoglycemic complications. Symptoms are improving. Diabetic complications include a CVA, heart disease and nephropathy. Risk factors for coronary artery disease include diabetes mellitus, dyslipidemia, hypertension, male sex and tobacco exposure. Current diabetic treatment  includes insulin injections and oral agent (monotherapy). He is compliant with treatment most of the time. His weight is increasing steadily. He is following a diabetic diet. He has had a previous visit with a dietitian. He never participates in exercise. His home blood glucose trend is decreasing steadily. His overall blood glucose range is >200 mg/dl. (He presents with no logs, meter average is 235 mg per DL.  His point-of-care A1c is 9.5% improving from 11.1% during his last visit.) An ACE inhibitor/angiotensin II receptor blocker is being taken. Eye exam is current.  Hyperlipidemia This is a chronic problem. The current episode  started more than 1 year ago. Exacerbating diseases include diabetes and obesity. Pertinent negatives include no chest pain, myalgias or shortness of breath. Current antihyperlipidemic treatment includes statins. Risk factors for coronary artery disease include family history, dyslipidemia, diabetes mellitus, hypertension, male sex, a sedentary lifestyle and obesity.  Thyroid Problem Presents for follow-up (He has longstanding hypothyroidism, currently on levothyroxine 150 mcg p.o. daily before breakfast.  He reports compliance with medication.) visit. Patient reports no fatigue. His past medical history is significant for diabetes and hyperlipidemia.     Objective:      Results for orders placed or performed in visit on 10/22/19  Hemoglobin A1c  Result Value Ref Range   Hemoglobin A1C 9.5    Lab Results  Component Value Date   WBC 9.1 09/27/2010   HGB 13.2 09/27/2010   HCT 42.7 09/27/2010   MCV 95.1 09/27/2010   PLT 174 09/27/2010    Lab Results  Component Value Date   HGBA1C 9.5 10/14/2019   HGBA1C 11.1 (H) 07/01/2019   HGBA1C 11.1 07/01/2019     Lipid Panel     Component Value Date/Time   CHOL 117 09/09/2018 0949   CHOL 181 07/17/2017 0757   TRIG 96 09/09/2018 0949   TRIG 329 10/02/2007 0000   HDL 39 (L) 09/09/2018 0949   HDL 35 (L) 07/17/2017  0757   CHOLHDL 3.0 09/09/2018 0949   VLDL 19 09/09/2018 0949   LDLCALC 59 09/09/2018 0949   LDLCALC 108 (H) 07/17/2017 0757   LDLCALC 66 10/02/2007 0000     Assessment & Plan:   1. Type 2 diabetes mellitus with stage 3 chronic kidney disease, with long-term current use of insulin    His diabetes is  complicated by  CAD, CKD.CVA, Retinopathy.   He presents with no logs, meter average is 235 mg per DL.  His point-of-care A1c is 9.5% improving from 11.1% during his last visit.    Recent labs reviewed.  - Patient remains at a high risk for more acute and chronic complications of diabetes which include CAD, CVA, CKD, retinopathy, and neuropathy. These are all discussed in detail with the patient.  -He will continue to require intensive treatment with basal/bolus insulin in order for him to achieve and maintain control of diabetes to target.   -Since he did not bring his logs showing insulin administration, he is advised to continue Lantus 100 units nightly, Humalog 20 units 3 times a day before meals for pre-meal blood glucose above 90 mg/dL plus correction associated with monitoring of glucose 4 times daily- before meals and at bedtime. -He will benefit from CGM device.  Prescription will be present to advanced diabetes supplies in [place].    -Patient is encouraged to call clinic for blood glucose levels less than 70 or above 300 mg /dl.  -Due to CKD patient is not a candidate for metformin , SGLT2i. -He is advised to continue Januvia 50 mg p.o. every morning with breakfast.   -He may benefit from low-dose glipizide.  I discussed and added glipizide 5 mg XL p.o. daily at breakfast. -He  does not tolerate metformin. -Target numbers for A1c, LDL, HDL, Triglycerides, Waist Circumference were discussed in detail.   2) Lipids/HPL: His most recent lipid panel showed controlled LDL at 59, improving from 161.  He is benefiting from statin intervention, advised to continue simvastatin 40 mg  p.o. nightly.  He is also on fenofibrate 160 mg p.o. nightly along with fish oil 2 g p.o. twice daily.  Side effects and precautions discussed with him.    3) Hypothyroidism:   -His levothyroxine was recently adjusted due to abnormal thyroid function tests.  He is advised to continue  levothyroxine 150 mcg p.o. daily before breakfast.     - We discussed about the correct intake of his thyroid hormone, on empty stomach at fasting, with water, separated by at least 30 minutes from breakfast and other medications,  and separated by more than 4 hours from calcium, iron, multivitamins, acid reflux medications (PPIs). -Patient is made aware of the fact that thyroid hormone replacement is needed for life, dose to be adjusted by periodic monitoring of thyroid function tests.   4) hypertension/BP-his blood pressure is controlled marginally at 95/77.  He will not tolerate pressure medications.    I advised patient to maintain close follow up with his PCP for primary care needs.   - Time spent on this patient care encounter:  35 min, of which >50% was spent in  counseling and the rest reviewing his  current and  previous labs/studies ( including abstraction from other facilities),  previous treatments, his blood glucose readings, and medications' doses and developing a plan for long-term care based on the latest recommendations for standards of care; and documenting his care.  Dierdre Highman participated in the discussions, expressed understanding, and voiced agreement with the above plans.  All questions were answered to his satisfaction. he is encouraged to contact clinic should he have any questions or concerns prior to his return visit.   Follow up plan: Return in about 4 months (around 02/21/2020) for Bring Meter and Logs- A1c in Office, Follow up with Pre-visit Labs.  Marquis Lunch, MD Phone: [phone]  Fax: [phone]  This note was partially dictated with voice recognition software. Similar  sounding words can be transcribed inadequately or may not  be corrected upon review.  10/22/2019, 12:00 PM

## 2019-10-28 ENCOUNTER — Telehealth: Payer: Self-pay | Admitting: "Endocrinology

## 2019-10-28 NOTE — Telephone Encounter (Signed)
[name] with [facility] called and needs a DX code for the pt's [name]. Call back at [phone]

## 2019-10-29 ENCOUNTER — Emergency Department (HOSPITAL_COMMUNITY): Payer: Medicare Other

## 2019-10-29 ENCOUNTER — Other Ambulatory Visit: Payer: Self-pay

## 2019-10-29 ENCOUNTER — Emergency Department (HOSPITAL_COMMUNITY)
Admission: EM | Admit: 2019-10-29 | Discharge: 2019-10-29 | Disposition: A | Discharge status: Home / Self Care | Payer: Medicare Other | Attending: Emergency Medicine | Admitting: Emergency Medicine

## 2019-10-29 ENCOUNTER — Encounter (HOSPITAL_COMMUNITY): Payer: Self-pay | Admitting: Emergency Medicine

## 2019-10-29 DIAGNOSIS — I10 Essential (primary) hypertension: Secondary | ICD-10-CM | POA: Insufficient documentation

## 2019-10-29 DIAGNOSIS — Z951 Presence of aortocoronary bypass graft: Secondary | ICD-10-CM | POA: Diagnosis not present

## 2019-10-29 DIAGNOSIS — E119 Type 2 diabetes mellitus without complications: Secondary | ICD-10-CM | POA: Diagnosis not present

## 2019-10-29 DIAGNOSIS — Z7982 Long term (current) use of aspirin: Secondary | ICD-10-CM | POA: Diagnosis not present

## 2019-10-29 DIAGNOSIS — J449 Chronic obstructive pulmonary disease, unspecified: Secondary | ICD-10-CM | POA: Diagnosis not present

## 2019-10-29 DIAGNOSIS — E039 Hypothyroidism, unspecified: Secondary | ICD-10-CM | POA: Diagnosis not present

## 2019-10-29 DIAGNOSIS — R0602 Shortness of breath: Secondary | ICD-10-CM | POA: Insufficient documentation

## 2019-10-29 DIAGNOSIS — Z87891 Personal history of nicotine dependence: Secondary | ICD-10-CM | POA: Diagnosis not present

## 2019-10-29 DIAGNOSIS — Z79899 Other long term (current) drug therapy: Secondary | ICD-10-CM | POA: Insufficient documentation

## 2019-10-29 DIAGNOSIS — Z20822 Contact with and (suspected) exposure to covid-19: Secondary | ICD-10-CM | POA: Diagnosis not present

## 2019-10-29 DIAGNOSIS — Z794 Long term (current) use of insulin: Secondary | ICD-10-CM | POA: Insufficient documentation

## 2019-10-29 LAB — CBC WITH DIFFERENTIAL/PLATELET
Abs Immature Granulocytes: 0.05 10*3/uL (ref 0.00–0.07)
Basophils Absolute: 0 10*3/uL (ref 0.0–0.1)
Basophils Relative: 0 %
Eosinophils Absolute: 0.2 10*3/uL (ref 0.0–0.5)
Eosinophils Relative: 2 %
HCT: 43.4 % (ref 39.0–52.0)
Hemoglobin: 13.9 g/dL (ref 13.0–17.0)
Immature Granulocytes: 1 %
Lymphocytes Relative: 28 %
Lymphs Abs: 2.9 10*3/uL (ref 0.7–4.0)
MCH: 30.5 pg (ref 26.0–34.0)
MCHC: 32 g/dL (ref 30.0–36.0)
MCV: 95.2 fL (ref 80.0–100.0)
Monocytes Absolute: 1.1 10*3/uL — ABNORMAL HIGH (ref 0.1–1.0)
Monocytes Relative: 11 %
Neutro Abs: 5.9 10*3/uL (ref 1.7–7.7)
Neutrophils Relative %: 58 %
Platelets: 222 10*3/uL (ref 150–400)
RBC: 4.56 MIL/uL (ref 4.22–5.81)
RDW: 13.7 % (ref 11.5–15.5)
WBC: 10.1 10*3/uL (ref 4.0–10.5)
nRBC: 0 % (ref 0.0–0.2)

## 2019-10-29 LAB — COMPREHENSIVE METABOLIC PANEL
ALT: 63 U/L — ABNORMAL HIGH (ref 0–44)
AST: 52 U/L — ABNORMAL HIGH (ref 15–41)
Albumin: 4.2 g/dL (ref 3.5–5.0)
Alkaline Phosphatase: 40 U/L (ref 38–126)
Anion gap: 9 (ref 5–15)
BUN: 24 mg/dL — ABNORMAL HIGH (ref 8–23)
CO2: 26 mmol/L (ref 22–32)
Calcium: 9.6 mg/dL (ref 8.9–10.3)
Chloride: 103 mmol/L (ref 98–111)
Creatinine, Ser: 1.26 mg/dL — ABNORMAL HIGH (ref 0.61–1.24)
GFR calc Af Amer: >60 mL/min (ref >60)
GFR calc non Af Amer: >60 mL/min (ref >60)
Glucose, Bld: 134 mg/dL — ABNORMAL HIGH (ref 70–99)
Potassium: 3.9 mmol/L (ref 3.5–5.1)
Sodium: 138 mmol/L (ref 135–145)
Total Bilirubin: 0.5 mg/dL (ref 0.3–1.2)
Total Protein: 7.6 g/dL (ref 6.5–8.1)

## 2019-10-29 LAB — TROPONIN I (HIGH SENSITIVITY)
Troponin I (High Sensitivity): 9 ng/L (ref <18)
Troponin I (High Sensitivity): 8 ng/L (ref <18)

## 2019-10-29 LAB — RESPIRATORY PANEL BY RT PCR (FLU A&B, COVID)
Influenza A by PCR: NEGATIVE
Influenza B by PCR: NEGATIVE
SARS Coronavirus 2 by RT PCR: NEGATIVE

## 2019-10-29 LAB — BRAIN NATRIURETIC PEPTIDE: B Natriuretic Peptide: 35 pg/mL (ref 0.0–100.0)

## 2019-10-29 LAB — D-DIMER, QUANTITATIVE: D-Dimer, Quant: 0.46 ug/mL-FEU (ref 0.00–0.50)

## 2019-10-29 MED ORDER — FUROSEMIDE 10 MG/ML IJ SOLN
60.0000 mg | Freq: Once | INTRAMUSCULAR | Status: AC
  Administered 2019-10-29: 60 mg via INTRAVENOUS
  Filled 2019-10-29: qty 6

## 2019-10-29 MED ORDER — ONDANSETRON HCL 4 MG PO TABS: 4.0000 mg | ORAL_TABLET | Freq: Four times a day (QID) | ORAL | 0 refills | Status: DC | PRN

## 2019-10-29 NOTE — ED Triage Notes (Signed)
Pt states he got Covid vaccination on 10/14/19 (1st dose) and has been having nausea with vomiting (4 times in 2 days), body aches, chills, constipation, shortness of breath, difficulty sleeping (cannot lie flat), and "not much of a taste".

## 2019-10-29 NOTE — ED Provider Notes (Signed)
[facility] EMERGENCY DEPARTMENT Provider Note   CSN: [id number] Arrival date & time: 10/29/19  0049   Time seen 2:05 AM  History Chief Complaint  Patient presents with  . Shortness of Breath    [name] is a 64 y.o. male.  HPI   Patient states he took the Materna Covid vaccine on March 3.  2 days later he started having symptoms.  He states he had right arm soreness, he has had persistent nausea, he states he had vomiting about 3 times but not recently.  He feels short of breath that is worse when he talks or walks and also if he tries to lay flat at night to sleep.  He denies diarrhea but feels like he is having constipation.  He lost his sense of taste about 4 to 5 days after the vaccine.  He states he has a dry cough without rhinorrhea or sore throat.  He has a mild headache.  He states 5 days ago he had a feeling of "impending doom".  So that is why he presents to the emergency room tonight.  He denies chest pain and is unsure of fever.  He has muscle cramping.  He states he has been eating well despite having the nausea.  He has not been around anybody that he is aware of who has been ill.  Patient states he had congestive heart failure many years ago.  PCP Assunta Found, MD    Past Medical History:  Diagnosis Date  . ASCVD (arteriosclerotic cardiovascular disease)    -MI in 01/2001 prompted CABG; EF-30% at cath; 50% on echo in 7/02 and normal in 2005  . Asthma   . Cerebrovascular disease     L CEA 06/2004 following left renal embolism; 10/2008 plaque w/o focal stenosis  . Chronic back pain   . Chronic leg pain   . Chronic obstructive pulmonary disease (HCC)   . Degenerative joint disease     chronic LBP-s/p L3-4 fusion  . Diabetes mellitus    -no insulin  . Hyperlipidemia   . Hypertension   . Hypothyroidism   . Obesity   . Restless leg syndrome    Possible  . Tobacco abuse, in remission    50 pack years; discontinued in 2002    Patient Active Problem  List   Diagnosis Date Noted  . Type 2 diabetes mellitus with vascular disease (HCC) 06/03/2015  . ASCVD (arteriosclerotic cardiovascular disease)   . Cerebrovascular disease   . Mixed hyperlipidemia   . Tobacco abuse, in remission   . Hypothyroidism   . Class 3 obesity due to excess calories with serious comorbidity and body mass index (BMI) of 40.0 to 44.9 in adult   . Degenerative joint disease   . Essential hypertension   . Chronic obstructive pulmonary disease (HCC) 12/18/2009  . Type 2 diabetes mellitus with stage 3 chronic kidney disease (HCC) 02/11/2009    Past Surgical History:  Procedure Laterality Date  . CAROTID ENDARTERECTOMY  2005   Left  . CATARACT EXTRACTION     bilateral  . CORONARY ARTERY BYPASS GRAFT  2002  . LUMBAR SPINE SURGERY     L3-4 fusion       History reviewed. No pertinent family history.  Social History   Tobacco Use  . Smoking status: Former Smoker    Packs/day: 1.00    Years: 50.00    Pack years: 50.00    Types: Cigarettes    Quit date: 01/15/2001  Years since quitting: 18.7  . Smokeless tobacco: Never Used  Substance Use Topics  . Alcohol use: No  . Drug use: No    Home Medications Prior to Admission medications   Medication Sig Start Date End Date Taking? Authorizing Provider  ACCU-CHEK AVIVA PLUS test strip USE AS DIRECTED UP TO 6 TIMES DAILY. 08/11/19   Roma Kayser, MD  Acetaminophen (TYLENOL ARTHRITIS EXT RELIEF PO) Take 1-2 tablets by mouth daily as needed (for pain).    [provider]  albuterol-ipratropium (COMBIVENT) 18-103 MCG/ACT inhaler Inhale 2 puffs into the lungs every 6 (six) hours as needed for wheezing or shortness of breath.     [provider]  aspirin 81 MG tablet Take 81 mg by mouth daily.      [provider]  B Complex-Biotin-FA (VITAMIN B50 COMPLEX PO) Take by mouth daily.    [provider]  Cholecalciferol (VITAMIN D PO) Take 1 capsule by mouth daily.     [provider]  Continuous Blood Gluc Sensor (FREESTYLE LIBRE 14 DAY SENSOR) MISC Inject 1 each into the skin every 14 (fourteen) days. Use as directed. 10/22/19   Roma Kayser, MD  docusate sodium (COLACE) 100 MG capsule Take 100 mg by mouth 2 (two) times daily.      [provider]  escitalopram (LEXAPRO) 20 MG tablet Take 20 mg by mouth daily.      [provider]  fenofibrate 160 MG tablet Take 160 mg by mouth daily.      [provider]  fish oil-omega-3 fatty acids 1000 MG capsule Take 2 g by mouth daily.    [provider]  glipiZIDE (GLUCOTROL XL) 5 MG 24 hr tablet Take 1 tablet (5 mg total) by mouth daily with breakfast. 10/22/19   Nida, Denman George, MD  HYDROcodone-acetaminophen (NORCO) 10-325 MG per tablet Take 2 tablets by mouth.     [provider]  Insulin Glargine (LANTUS SOLOSTAR) 100 UNIT/ML Solostar Pen Inject 100 Units into the skin at bedtime. 07/06/19   Roma Kayser, MD  insulin lispro (HUMALOG) 100 UNIT/ML KwikPen INJECT 20 TO 26 UNITS INTO THE SKIN 3 TIMES DAILY BEFORE MEALS. 10/21/19   Roma Kayser, MD  JANUVIA 50 MG tablet TAKE 1 TABLET BY MOUTH DAILY. 10/07/19   Roma Kayser, MD  levothyroxine (SYNTHROID) 150 MCG tablet Take 1 tablet (150 mcg total) by mouth daily before breakfast. 07/06/19   Nida, Denman George, MD  Multiple Vitamins-Minerals (MULTIVITAMIN WITH MINERALS) tablet Take 1 tablet by mouth daily.      [provider]  ondansetron (ZOFRAN) 4 MG tablet Take 1 tablet (4 mg total) by mouth every 6 (six) hours as needed for nausea or vomiting. 10/29/19   Devoria Albe, MD  simvastatin (ZOCOR) 40 MG tablet Take 1 tablet (40 mg total) by mouth at bedtime. 01/15/17   Roma Kayser, MD  SURE COMFORT PEN NEEDLES 31G X 8 MM MISC USE AS DIRECTED WITH LANTUS AND NOVOLOG UP TO FOUR TIMES DAILY. 07/16/19   Roma Kayser, MD  vitamin B-12 (CYANOCOBALAMIN) 100 MCG tablet  Take 100 mcg by mouth daily.    [provider]  vitamin C (ASCORBIC ACID) 500 MG tablet Take 500 mg by mouth daily.    [provider]    Allergies    Alprazolam, Atorvastatin, Gemfibrozil, and Invokana [canagliflozin]  Review of Systems   Review of Systems  All other systems reviewed and are negative.  Physical Exam Updated Vital Signs BP (!) 141/68   Pulse 70   Temp 97.9 F (36.6 C) (Oral)   Resp 18   Ht 5\' 9"  (1.753 m)   Wt 133.4 kg   SpO2 98%   BMI 43.45 kg/m   Physical Exam Vitals and nursing note reviewed.  Constitutional:      General: He is not in acute distress.    Appearance: Normal appearance. He is well-developed. He is not ill-appearing or toxic-appearing.  HENT:     Head: Normocephalic and atraumatic.     Right Ear: External ear normal.     Left Ear: External ear normal.     Nose: Nose normal. No mucosal edema or rhinorrhea.     Mouth/Throat:     Dentition: No dental abscesses.     Pharynx: No uvula swelling.  Eyes:     Extraocular Movements: Extraocular movements intact.     Conjunctiva/sclera: Conjunctivae normal.     Pupils: Pupils are equal, round, and reactive to light.  Cardiovascular:     Rate and Rhythm: Normal rate and regular rhythm.     Heart sounds: Normal heart sounds. No murmur. No friction rub. No gallop.   Pulmonary:     Effort: Pulmonary effort is normal. No respiratory distress.     Breath sounds: Normal breath sounds. No wheezing, rhonchi or rales.  Chest:     Chest wall: No tenderness or crepitus.  Musculoskeletal:        General: No tenderness. Normal range of motion.     Cervical back: Full passive range of motion without pain, normal range of motion and neck supple.     Comments: Moves all extremities well.  Trace pitting edema  Skin:    General: Skin is warm and dry.     Coloration: Skin is not pale.     Findings: No erythema or rash.  Neurological:     General: No focal deficit present.     Mental  Status: He is alert and oriented to person, place, and time.     Cranial Nerves: No cranial nerve deficit.  Psychiatric:        Mood and Affect: Mood normal. Mood is not anxious.        Speech: Speech normal.        Behavior: Behavior normal.        Thought Content: Thought content normal.     ED Results / Procedures / Treatments   Labs (all labs ordered are listed, but only abnormal results are displayed)   Results for orders placed or performed during the hospital encounter of 10/29/19  Respiratory Panel by RT PCR (Flu A&B, Covid) - Nasopharyngeal Swab   Specimen: Nasopharyngeal Swab  Result Value Ref Range   SARS Coronavirus 2 by RT PCR NEGATIVE NEGATIVE   Influenza A by PCR NEGATIVE NEGATIVE   Influenza B by PCR NEGATIVE NEGATIVE  Comprehensive metabolic panel  Result Value Ref Range   Sodium 138 135 - 145 mmol/L   Potassium 3.9 3.5 - 5.1 mmol/L   Chloride 103 98 - 111 mmol/L   CO2 26 22 - 32 mmol/L   Glucose, Bld 134 (H) 70 - 99 mg/dL   BUN 24 (H) 8 - 23 mg/dL   Creatinine, Ser 8.651.26 (H) 0.61 - 1.24 mg/dL   Calcium 9.6 8.9 - 78.410.3 mg/dL   Total Protein 7.6 6.5 - 8.1 g/dL   Albumin 4.2 3.5 - 5.0 g/dL   AST 52 (H) 15 -  41 U/L   ALT 63 (H) 0 - 44 U/L   Alkaline Phosphatase 40 38 - 126 U/L   Total Bilirubin 0.5 0.3 - 1.2 mg/dL   GFR calc non Af Amer >60 >60 mL/min   GFR calc Af Amer >60 >60 mL/min   Anion gap 9 5 - 15  Brain natriuretic peptide  Result Value Ref Range   B Natriuretic Peptide 35.0 0.0 - 100.0 pg/mL  CBC with Differential  Result Value Ref Range   WBC 10.1 4.0 - 10.5 K/uL   RBC 4.56 4.22 - 5.81 MIL/uL   Hemoglobin 13.9 13.0 - 17.0 g/dL   HCT 43.4 39.0 - 52.0 %   MCV 95.2 80.0 - 100.0 fL   MCH 30.5 26.0 - 34.0 pg   MCHC 32.0 30.0 - 36.0 g/dL   RDW 13.7 11.5 - 15.5 %   Platelets 222 150 - 400 K/uL   nRBC 0.0 0.0 - 0.2 %   Neutrophils Relative % 58 %   Neutro Abs 5.9 1.7 - 7.7 K/uL   Lymphocytes Relative 28 %   Lymphs Abs 2.9 0.7 - 4.0 K/uL    Monocytes Relative 11 %   Monocytes Absolute 1.1 (H) 0.1 - 1.0 K/uL   Eosinophils Relative 2 %   Eosinophils Absolute 0.2 0.0 - 0.5 K/uL   Basophils Relative 0 %   Basophils Absolute 0.0 0.0 - 0.1 K/uL   Immature Granulocytes 1 %   Abs Immature Granulocytes 0.05 0.00 - 0.07 K/uL  D-dimer, quantitative  Result Value Ref Range   D-Dimer, Quant 0.46 0.00 - 0.50 ug/mL-FEU  Troponin I (High Sensitivity)  Result Value Ref Range   Troponin I (High Sensitivity) 9 <18 ng/L  Troponin I (High Sensitivity)  Result Value Ref Range   Troponin I (High Sensitivity) 8 <18 ng/L   Laboratory interpretation all normal except stable renal insufficiency   Results for orders placed or performed in visit on 10/22/19  Hemoglobin A1c  Result Value Ref Range   Hemoglobin A1C 9.5        EKG EKG Interpretation  Date/Time:  Thursday October 29 2019 02:56:42 EDT Ventricular Rate:  72 PR Interval:    QRS Duration: 96 QT Interval:  385 QTC Calculation: 422 R Axis:   57 Text Interpretation: Sinus rhythm Probable left atrial enlargement Consider anterior infarct Baseline wander No significant change since last tracing 12 Aug 2007 Confirmed by Rolland Porter [phone]) on 10/29/2019 3:04:48 AM   Radiology DG Chest Port 1 View  Result Date: 10/29/2019 CLINICAL DATA:  65 year old male with shortness of breath. EXAM: PORTABLE CHEST 1 VIEW COMPARISON:  Chest radiograph dated 07/12/2014. FINDINGS: There is stable cardiomegaly with probable mild vascular congestion. No focal consolidation, pleural effusion, or pneumothorax. Median sternotomy wires and CABG vascular clips. Atherosclerotic calcification of the aorta. No acute osseous pathology. IMPRESSION: Stable cardiomegaly with probable mild vascular congestion. No focal consolidation. Electronically Signed   By: Anner Crete M.D.   On: 10/29/2019 02:09    Procedures Procedures (including critical care time)  Medications Ordered in ED Medications  furosemide  (LASIX) injection 60 mg (60 mg Intravenous Given 10/29/19 0336)    ED Course  I have reviewed the triage vital signs and the nursing notes.  Pertinent labs & imaging results that were available during my care of the patient were reviewed by me and considered in my medical decision making (see chart for details).    MDM Rules/Calculators/A&P  Patient symptoms were suspicious for Covid infection, Covid testing was done.  After reviewing his x-ray laboratory testing was done to evaluate him for return of his congestive heart failure.  He was given Lasix 60 mg IV.  Recheck at 3:10 AM patient was informed his Covid and flu test were negative.  Nursing staff is attempting ultrasound IV placement.  Still waiting for the rest of his labs to result.  Recheck at 4:30 AM patient is feeling better.  He is having good diuresis.  At time of discharge patient had filled a urinal.  He states he is feeling better however he still has some nausea.  He feels like he can go home with some nausea medication.  ATTICUS WEDIN was evaluated in Emergency Department on 10/29/2019 for the symptoms described in the history of present illness. He was evaluated in the context of the global COVID-19 pandemic, which necessitated consideration that the patient might be at risk for infection with the SARS-CoV-2 virus that causes COVID-19. Institutional protocols and algorithms that pertain to the evaluation of patients at risk for COVID-19 are in a state of rapid change based on information released by regulatory bodies including the CDC and federal and state organizations. These policies and algorithms were followed during the patient's care in the ED.   Final Clinical Impression(s) / ED Diagnoses Final diagnoses:  Shortness of breath    Rx / DC Orders ED Discharge Orders         Ordered    ondansetron (ZOFRAN) 4 MG tablet  Every 6 hours PRN     10/29/19 0544         Plan discharge  Devoria Albe, MD, Concha Pyo, MD 10/29/19 [phone]

## 2019-10-29 NOTE — ED Notes (Signed)
ED Provider at bedside. 

## 2019-10-29 NOTE — Telephone Encounter (Signed)
Left VM for [facility] at [facility]. Dx code lft on VM

## 2019-10-29 NOTE — Discharge Instructions (Addendum)
Use the Zofran for your nausea or vomiting.  Try to drink fluids and eat a regular diet.  Please call Dr. Lamar Blinks office to let him know about your ED visit.

## 2019-11-16 DIAGNOSIS — G894 Chronic pain syndrome: Secondary | ICD-10-CM | POA: Diagnosis not present

## 2019-12-16 DIAGNOSIS — G894 Chronic pain syndrome: Secondary | ICD-10-CM | POA: Diagnosis not present

## 2019-12-25 ENCOUNTER — Other Ambulatory Visit: Payer: Self-pay | Admitting: "Endocrinology

## 2020-01-05 ENCOUNTER — Other Ambulatory Visit: Payer: Self-pay | Admitting: "Endocrinology

## 2020-01-11 DIAGNOSIS — I129 Hypertensive chronic kidney disease with stage 1 through stage 4 chronic kidney disease, or unspecified chronic kidney disease: Secondary | ICD-10-CM | POA: Diagnosis not present

## 2020-01-11 DIAGNOSIS — I25119 Atherosclerotic heart disease of native coronary artery with unspecified angina pectoris: Secondary | ICD-10-CM | POA: Diagnosis not present

## 2020-01-11 DIAGNOSIS — J449 Chronic obstructive pulmonary disease, unspecified: Secondary | ICD-10-CM | POA: Diagnosis not present

## 2020-01-11 DIAGNOSIS — Z87891 Personal history of nicotine dependence: Secondary | ICD-10-CM | POA: Diagnosis not present

## 2020-01-14 ENCOUNTER — Other Ambulatory Visit: Payer: Self-pay | Admitting: "Endocrinology

## 2020-01-14 DIAGNOSIS — G894 Chronic pain syndrome: Secondary | ICD-10-CM | POA: Diagnosis not present

## 2020-01-14 DIAGNOSIS — G2581 Restless legs syndrome: Secondary | ICD-10-CM | POA: Diagnosis not present

## 2020-01-14 DIAGNOSIS — M5136 Other intervertebral disc degeneration, lumbar region: Secondary | ICD-10-CM | POA: Diagnosis not present

## 2020-01-20 DIAGNOSIS — E1122 Type 2 diabetes mellitus with diabetic chronic kidney disease: Secondary | ICD-10-CM | POA: Diagnosis not present

## 2020-01-20 DIAGNOSIS — Z794 Long term (current) use of insulin: Secondary | ICD-10-CM | POA: Diagnosis not present

## 2020-01-26 ENCOUNTER — Ambulatory Visit: Payer: Medicare Other | Admitting: "Endocrinology

## 2020-01-28 ENCOUNTER — Other Ambulatory Visit: Payer: Self-pay | Admitting: "Endocrinology

## 2020-02-08 DIAGNOSIS — G894 Chronic pain syndrome: Secondary | ICD-10-CM | POA: Diagnosis not present

## 2020-02-10 ENCOUNTER — Other Ambulatory Visit: Payer: Self-pay | Admitting: "Endocrinology

## 2020-02-10 DIAGNOSIS — N182 Chronic kidney disease, stage 2 (mild): Secondary | ICD-10-CM | POA: Diagnosis not present

## 2020-02-10 DIAGNOSIS — E1122 Type 2 diabetes mellitus with diabetic chronic kidney disease: Secondary | ICD-10-CM | POA: Diagnosis not present

## 2020-02-10 DIAGNOSIS — E039 Hypothyroidism, unspecified: Secondary | ICD-10-CM | POA: Diagnosis not present

## 2020-02-10 DIAGNOSIS — I129 Hypertensive chronic kidney disease with stage 1 through stage 4 chronic kidney disease, or unspecified chronic kidney disease: Secondary | ICD-10-CM | POA: Diagnosis not present

## 2020-02-25 DIAGNOSIS — G8929 Other chronic pain: Secondary | ICD-10-CM | POA: Diagnosis not present

## 2020-02-25 DIAGNOSIS — J449 Chronic obstructive pulmonary disease, unspecified: Secondary | ICD-10-CM | POA: Diagnosis not present

## 2020-02-25 DIAGNOSIS — J22 Unspecified acute lower respiratory infection: Secondary | ICD-10-CM | POA: Diagnosis not present

## 2020-02-26 DIAGNOSIS — E039 Hypothyroidism, unspecified: Secondary | ICD-10-CM | POA: Diagnosis not present

## 2020-02-26 DIAGNOSIS — E1159 Type 2 diabetes mellitus with other circulatory complications: Secondary | ICD-10-CM | POA: Diagnosis not present

## 2020-02-26 DIAGNOSIS — E782 Mixed hyperlipidemia: Secondary | ICD-10-CM | POA: Diagnosis not present

## 2020-02-26 DIAGNOSIS — E7849 Other hyperlipidemia: Secondary | ICD-10-CM | POA: Diagnosis not present

## 2020-02-26 DIAGNOSIS — E559 Vitamin D deficiency, unspecified: Secondary | ICD-10-CM | POA: Diagnosis not present

## 2020-02-27 DIAGNOSIS — E1165 Type 2 diabetes mellitus with hyperglycemia: Secondary | ICD-10-CM | POA: Diagnosis not present

## 2020-02-27 LAB — MICROALBUMIN / CREATININE URINE RATIO
Creatinine, Urine: 168 mg/dL (ref 20–320)
Microalb Creat Ratio: 461 mcg/mg creat — ABNORMAL HIGH (ref <30)
Microalb, Ur: 77.4 mg/dL

## 2020-02-27 LAB — COMPLETE METABOLIC PANEL WITH GFR
AG Ratio: 1.5 (calc) (ref 1.0–2.5)
ALT: 34 U/L (ref 9–46)
AST: 26 U/L (ref 10–35)
Albumin: 4.6 g/dL (ref 3.6–5.1)
Alkaline phosphatase (APISO): 49 U/L (ref 35–144)
BUN: 22 mg/dL (ref 7–25)
CO2: 26 mmol/L (ref 20–32)
Calcium: 11.1 mg/dL — ABNORMAL HIGH (ref 8.6–10.3)
Chloride: 100 mmol/L (ref 98–110)
Creat: 1.15 mg/dL (ref 0.70–1.25)
GFR, Est African American: 78 mL/min/{1.73_m2} (ref >=60)
GFR, Est Non African American: 67 mL/min/{1.73_m2} (ref >=60)
Globulin: 3 g/dL (calc) (ref 1.9–3.7)
Glucose, Bld: 140 mg/dL — ABNORMAL HIGH (ref 65–99)
Potassium: 4.9 mmol/L (ref 3.5–5.3)
Sodium: 140 mmol/L (ref 135–146)
Total Bilirubin: 0.4 mg/dL (ref 0.2–1.2)
Total Protein: 7.6 g/dL (ref 6.1–8.1)

## 2020-02-27 LAB — LIPID PANEL
Cholesterol: 210 mg/dL — ABNORMAL HIGH (ref <200)
HDL: 43 mg/dL (ref >=40)
LDL Cholesterol (Calc): 136 mg/dL (calc) — ABNORMAL HIGH
Non-HDL Cholesterol (Calc): 167 mg/dL (calc) — ABNORMAL HIGH (ref <130)
Total CHOL/HDL Ratio: 4.9 (calc) (ref <5.0)
Triglycerides: 173 mg/dL — ABNORMAL HIGH (ref <150)

## 2020-02-27 LAB — TSH: TSH: 3.59 mIU/L (ref 0.40–4.50)

## 2020-02-27 LAB — T4, FREE: Free T4: 1.2 ng/dL (ref 0.8–1.8)

## 2020-02-27 LAB — VITAMIN D 25 HYDROXY (VIT D DEFICIENCY, FRACTURES): Vit D, 25-Hydroxy: 19 ng/mL — ABNORMAL LOW (ref 30–100)

## 2020-03-07 ENCOUNTER — Other Ambulatory Visit: Payer: Self-pay | Admitting: "Endocrinology

## 2020-03-08 ENCOUNTER — Ambulatory Visit (INDEPENDENT_AMBULATORY_CARE_PROVIDER_SITE_OTHER): Payer: Medicare Other | Admitting: Nurse Practitioner

## 2020-03-08 ENCOUNTER — Encounter: Payer: Self-pay | Admitting: Nurse Practitioner

## 2020-03-08 ENCOUNTER — Other Ambulatory Visit: Payer: Self-pay

## 2020-03-08 VITALS — BP 134/77 | HR 47 | Ht 69.0 in | Wt 274.0 lb

## 2020-03-08 DIAGNOSIS — E1159 Type 2 diabetes mellitus with other circulatory complications: Secondary | ICD-10-CM | POA: Diagnosis not present

## 2020-03-08 DIAGNOSIS — E1165 Type 2 diabetes mellitus with hyperglycemia: Secondary | ICD-10-CM | POA: Diagnosis not present

## 2020-03-08 DIAGNOSIS — E039 Hypothyroidism, unspecified: Secondary | ICD-10-CM | POA: Diagnosis not present

## 2020-03-08 DIAGNOSIS — E782 Mixed hyperlipidemia: Secondary | ICD-10-CM | POA: Diagnosis not present

## 2020-03-08 DIAGNOSIS — I10 Essential (primary) hypertension: Secondary | ICD-10-CM | POA: Diagnosis not present

## 2020-03-08 LAB — POCT GLYCOSYLATED HEMOGLOBIN (HGB A1C): Hemoglobin A1C: 7.3 % — AB (ref 4.0–5.6)

## 2020-03-08 NOTE — Patient Instructions (Signed)
Advice for Weight Management  -For most of us the best way to lose weight is by diet management. Generally speaking, diet management means consuming less calories intentionally which over time brings about progressive weight loss.  This can be achieved more effectively by restricting carbohydrate consumption to the minimum possible.  So, it is critically important to know your numbers: how much calorie you are consuming and how much calorie you need. More importantly, our carbohydrates sources should be unprocessed or minimally processed complex starch food items.   Sometimes, it is important to balance nutrition by increasing protein intake (animal or plant source), fruits, and vegetables.  -Sticking to a routine mealtime to eat 3 meals a day and avoiding unnecessary snacks is shown to have a big role in weight control. Under normal circumstances, the only time we lose real weight is when we are hungry, so allow hunger to take place- hunger means no food between meal times, only water.  It is not advisable to starve.   -It is better to avoid simple carbohydrates including: Cakes, Sweet Desserts, Ice Cream, Soda (diet and regular), Sweet Tea, Candies, Chips, Cookies, Store Bought Juices, Alcohol in Excess of  1-2 drinks a day, Artificial Sweeteners, Doughnuts, Coffee Creamers, "Sugar-free" Products, etc, etc.  This is not a complete list.....    -Consulting with certified diabetes educators is proven to provide you with the most accurate and current information on diet.  Also, you may be  interested in discussing diet options/exchanges , we can schedule a visit with [name], RDN, CDE for individualized nutrition education.  -Exercise: If you are able: 30 -60 minutes a day ,4 days a week, or 150 minutes a week.  The longer the better.  Combine stretch, strength, and aerobic activities.  If you were told in the past that you have high risk for cardiovascular diseases, you may seek evaluation by  your heart doctor prior to initiating moderate to intense exercise programs.    

## 2020-03-08 NOTE — Progress Notes (Signed)
03/08/2020   Endocrinology follow-up note   Subjective:    Patient ID: [name], male    DOB: 02/09/1956,    Past Medical History:  Diagnosis Date   ASCVD (arteriosclerotic cardiovascular disease)    -MI in 01/2001 prompted CABG; EF-30% at cath; 50% on echo in 7/02 and normal in 2005   Asthma    Cerebrovascular disease     L CEA 06/2004 following left renal embolism; 10/2008 plaque w/o focal stenosis   Chronic back pain    Chronic leg pain    Chronic obstructive pulmonary disease (HCC)    Degenerative joint disease     chronic LBP-s/p L3-4 fusion   Diabetes mellitus    -no insulin   Hyperlipidemia    Hypertension    Hypothyroidism    Obesity    Restless leg syndrome    Possible   Tobacco abuse, in remission    50 pack years; discontinued in 2002   Past Surgical History:  Procedure Laterality Date   CAROTID ENDARTERECTOMY  2005   Left   CATARACT EXTRACTION     bilateral   CORONARY ARTERY BYPASS GRAFT  2002   LUMBAR SPINE SURGERY     L3-4 fusion   Social History   Socioeconomic History   Marital status: Divorced    Spouse name: Not on file   Number of children: 1   Years of education: Not on file   Highest education level: Not on file  Occupational History   Occupation: veteran    Comment: disabledf  Tobacco Use   Smoking status: Former Smoker    Packs/day: 1.00    Years: 50.00    Pack years: 50.00    Types: Cigarettes    Quit date: 01/15/2001    Years since quitting: 19.1   Smokeless tobacco: Never Used  Vaping Use   Vaping Use: Never used  Substance and Sexual Activity   Alcohol use: No   Drug use: No   Sexual activity: Not on file  Other Topics Concern   Not on file  Social History Narrative   Not on file   Social Determinants of Health   Financial Resource Strain:    Difficulty of Paying Living Expenses:   Food Insecurity:    Worried About Programme researcher, broadcasting/film/video in the Last Year:    Engineer, site in the Last Year:   Transportation Needs:    Freight forwarder (Medical):    Lack of Transportation (Non-Medical):   Physical Activity:    Days of Exercise per Week:    Minutes of Exercise per Session:   Stress:    Feeling of Stress :   Social Connections:    Frequency of Communication with Friends and Family:    Frequency of Social Gatherings with Friends and Family:    Attends Religious Services:    Active Member of Clubs or Organizations:    Attends Banker Meetings:    Marital Status:    Outpatient Encounter Medications as of 03/08/2020  Medication Sig   Acetaminophen (TYLENOL ARTHRITIS EXT RELIEF PO) Take 1-2 tablets by mouth daily as needed (for pain).   albuterol (VENTOLIN HFA) 108 (90 Base) MCG/ACT inhaler Inhale into the lungs.   albuterol-ipratropium (COMBIVENT) 18-103 MCG/ACT inhaler Inhale 2 puffs into the lungs every 6 (six) hours as needed for wheezing or shortness of breath.    aspirin 81 MG tablet Take 81 mg by mouth daily.  B Complex-Biotin-FA (VITAMIN B50 COMPLEX PO) Take by mouth daily.   Cholecalciferol (VITAMIN D PO) Take 1 capsule by mouth daily.   Continuous Blood Gluc Sensor (FREESTYLE LIBRE 14 DAY SENSOR) MISC Inject 1 each into the skin every 14 (fourteen) days. Use as directed.   docusate sodium (COLACE) 100 MG capsule Take 100 mg by mouth 2 (two) times daily.     escitalopram (LEXAPRO) 20 MG tablet Take 20 mg by mouth daily.     fenofibrate 160 MG tablet Take 160 mg by mouth daily.     fish oil-omega-3 fatty acids 1000 MG capsule Take 2 g by mouth daily.   gabapentin (NEURONTIN) 300 MG capsule Take 300 mg by mouth at bedtime.   GLIPIZIDE XL 5 MG 24 hr tablet TAKE (1) TABLET BY MOUTH EACH MORNING WITH BREAKFAST.   glucose blood (ACCU-CHEK AVIVA PLUS) test strip Use as directed to check blood glucose four times daily   HYDROcodone-acetaminophen (NORCO) 10-325 MG per tablet Take 2 tablets by mouth.     insulin lispro (HUMALOG) 100 UNIT/ML KwikPen INJECT 20 TO 26 UNITS INTO THE SKIN 3 TIMES DAILY BEFORE MEALS.   JANUVIA 50 MG tablet TAKE 1 TABLET BY MOUTH DAILY.   LANTUS SOLOSTAR 100 UNIT/ML Solostar Pen INJECT 100 UNITS SUBCUTANEOUSLY AT BEDTIME.   levothyroxine (SYNTHROID) 150 MCG tablet Take 1 tablet (150 mcg total) by mouth daily before breakfast.   Multiple Vitamins-Minerals (MULTIVITAMIN WITH MINERALS) tablet Take 1 tablet by mouth daily.     ondansetron (ZOFRAN) 4 MG tablet Take 1 tablet (4 mg total) by mouth every 6 (six) hours as needed for nausea or vomiting.   rosuvastatin (CRESTOR) 5 MG tablet Take 5 mg by mouth daily.   SURE COMFORT PEN NEEDLES 31G X 8 MM MISC USE AS DIRECTED WITH LANTUS AND NOVOLOG UP TO FOUR TIMES DAILY.   vitamin B-12 (CYANOCOBALAMIN) 100 MCG tablet Take 100 mcg by mouth daily.   vitamin C (ASCORBIC ACID) 500 MG tablet Take 500 mg by mouth daily.   [DISCONTINUED] simvastatin (ZOCOR) 40 MG tablet Take 1 tablet (40 mg total) by mouth at bedtime.   No facility-administered encounter medications on file as of 03/08/2020.   ALLERGIES: Allergies  Allergen Reactions   Alprazolam Nausea And Vomiting and Other (See Comments)    Altered mental status   Atorvastatin Nausea And Vomiting   Gemfibrozil     unknown   Invokana [Canagliflozin] Diarrhea and Nausea And Vomiting   VACCINATION STATUS: Immunization History  Administered Date(s) Administered   Influenza-Unspecified 05/13/2014    Diabetes He presents for his follow-up diabetic visit. He has type 2 diabetes mellitus. Onset time: He was diagnosed at approximate age of 50 years. His disease course has been improving. There are no hypoglycemic associated symptoms. Pertinent negatives for hypoglycemia include no confusion, headaches, pallor or tremors. Associated symptoms include weight loss. Pertinent negatives for diabetes include no fatigue, no polydipsia, no polyphagia and no polyuria. There are  no hypoglycemic complications. Symptoms are improving. Diabetic complications include a CVA, heart disease and nephropathy. Risk factors for coronary artery disease include diabetes mellitus, dyslipidemia, hypertension, male sex, tobacco exposure, sedentary lifestyle and obesity. Current diabetic treatment includes insulin injections and oral agent (dual therapy). He is compliant with treatment most of the time. His weight is decreasing rapidly (Has lost 20 pounds approximately since last visit in March 2021). He is following a diabetic and generally healthy diet. He has had a previous visit with a dietitian. He participates in  exercise intermittently. His home blood glucose trend is decreasing steadily. His breakfast blood glucose range is generally 130-140 mg/dl. His lunch blood glucose range is generally 130-140 mg/dl. His dinner blood glucose range is generally 110-130 mg/dl. His bedtime blood glucose range is generally 110-130 mg/dl. (He presents with his logs today showing near target fasting and postprandial glycemic profile.  His A1c today is 7.3% improving from 9.5% on last visit in March 2021.  He has since been able to obtain a CGM device (freestyle [place]), but is having trouble with sensor location.  He denies any major hypoglycemic events.  ) An ACE inhibitor/angiotensin II receptor blocker is being taken. He does not see a podiatrist.Eye exam is current.  Hyperlipidemia This is a chronic problem. The current episode started more than 1 year ago. Exacerbating diseases include diabetes and obesity. Associated symptoms include myalgias. Current antihyperlipidemic treatment includes statins. Risk factors for coronary artery disease include family history, dyslipidemia, diabetes mellitus, hypertension, male sex, a sedentary lifestyle and obesity.  Thyroid Problem Presents for follow-up (He has longstanding hypothyroidism, currently on levothyroxine 150 mcg p.o. daily before breakfast.  He reports  compliance with medication.) visit. Symptoms include weight loss. Patient reports no fatigue, palpitations or tremors. His past medical history is significant for diabetes and hyperlipidemia.   Review of Systems  Constitutional: Positive for weight loss. Negative for fatigue.  Cardiovascular: Negative for palpitations and leg swelling.  Musculoskeletal: Positive for myalgias.       He complains of intermittent, nocturnal cramping in his bilateral lower extremities.  Skin: Negative for pallor.  Neurological: Positive for tingling. Negative for tremors and headaches.  Endo/Heme/Allergies: Negative for polydipsia and polyphagia.  Psychiatric/Behavioral: Negative for confusion.     Objective:     Physical Exam- Limited  Constitutional:  Body mass index is 40.46 kg/m. , not in acute distress, normal state of mind Eyes:  EOMI, no exophthalmos Neck: Supple Thyroid: No gross goiter Cardiovascular: RRR, no murmers, rubs, or gallops  Respiratory: Adequate breathing efforts, no crackles, rales, rhonchi Musculoskeletal: no gross deformities, strength intact in all four extremities, no gross restriction of joint movements Skin:  no rashes, no hyperemia Neurological: no tremor with outstretched hands,   Foot exam:  He does not see a podiatrist and frequently does not wear appropriate footwear.  No rashes, ulcers, cuts, calluses, onychodystrophy.   Good pulses bilat.  Good sensation to 10 g monofilament bilat.    Results for orders placed or performed in visit on 03/08/20  HgB A1c  Result Value Ref Range   Hemoglobin A1C 7.3 (A) 4.0 - 5.6 %   HbA1c POC (<> result, manual entry)     HbA1c, POC (prediabetic range)     HbA1c, POC (controlled diabetic range)     Lab Results  Component Value Date   WBC 10.1 10/29/2019   HGB 13.9 10/29/2019   HCT 43.4 10/29/2019   MCV 95.2 10/29/2019   PLT 222 10/29/2019    Lab Results  Component Value Date   HGBA1C 7.3 (A) 03/08/2020   HGBA1C  9.5 10/14/2019   HGBA1C 11.1 (H) 07/01/2019     Lipid Panel     Component Value Date/Time   CHOL 210 (H) 02/26/2020 1002   CHOL 181 07/17/2017 0757   TRIG 173 (H) 02/26/2020 1002   TRIG 329 10/02/2007 0000   HDL 43 02/26/2020 1002   HDL 35 (L) 07/17/2017 0757   CHOLHDL 4.9 02/26/2020 1002   VLDL 19 09/09/2018 0949   LDLCALC  136 (H) 02/26/2020 1002   LDLCALC 66 10/02/2007 0000     Assessment & Plan:   1. Type 2 diabetes mellitus with stage 3 chronic kidney disease, with long-term current use of insulin    His diabetes is  complicated by  CAD, CKD.CVA, Retinopathy.   He presents with his logs today showing near target fasting and postprandial glycemic profile.  His A1c today is 7.3% improving from 9.5% on last visit in March 2021.  He has since been able to obtain a CGM device (freestyle [place]), but is having trouble with sensor location.  He denies any major hypoglycemic events.    Recent labs reviewed.  - Patient remains at a high risk for more acute and chronic complications of diabetes which include CAD, CVA, CKD, retinopathy, and neuropathy. These are all discussed in detail with the patient.  -He will continue to require intensive treatment with basal/bolus insulin in order for him to achieve and maintain control of diabetes to target.   -He is advised to continue his Lantus 100 units SQ nightly, continue his Humalog 20 units plus individualized sliding scale based on glucose readings above 90 and eating.  The max dose of Humalog at each meal should be 26 units. He is advised to continue Januvia 50 mg p.o. daily with breakfast and continue glipizide 5 mg XL daily with breakfast. Encouraged patient to utilize CGM device and continue logging glucose readings 4 times a day, before meals and at bedtime.    -Patient is encouraged to call clinic for blood glucose levels less than 70 or above 300 mg /dl.  -Due to CKD patient is not a candidate for metformin , SGLT2i.  -Target  numbers for A1c, LDL, HDL, Triglycerides, Waist Circumference were discussed in detail.   2) Lipids/HPL:  His most recent lipid panel shows LDL of 136, worsening from 59 in January 2020.  He is benefiting from statin intervention and is advised to continue Crestor 5 mg p.o. daily at bedtime, omega-3 fatty acid supplement, and fenofibrate 160 mg p.o. daily.  He does report some mild intermittent nocturnal cramping in bilateral lower extremities over the last 6 months. Side effects and precautions discussed with him, may need to discontinue this medication if symptoms persist.  3) Hypothyroidism:   -His most recent thyroid function tests show adequate replacement.  He is advised to continue levothyroxine 150 mcg p.o. daily before breakfast.   - We discussed about the correct intake of his thyroid hormone, on empty stomach at fasting, with water, separated by at least 30 minutes from breakfast and other medications,  and separated by more than 4 hours from calcium, iron, multivitamins, acid reflux medications (PPIs). -Patient is made aware of the fact that thyroid hormone replacement is needed for life, dose to be adjusted by periodic monitoring of thyroid function tests.   4) hypertension/BP- His blood pressure is controlled to target without the use of antihypertensive medication.  We will continue to monitor blood pressure on subsequent visits and will consider antihypertensive therapy if blood pressure is above 140/90 on 2 separate occasions.   I advised patient to maintain close follow up with his PCP for primary care needs.   - Time spent on this patient care encounter:  35 min, of which > 50% was spent in  counseling and the rest reviewing his blood glucose logs , discussing his hypoglycemia and hyperglycemia episodes, reviewing his current and  previous labs / studies  ( including abstraction from other facilities) and  medications  doses and developing a  long term treatment plan and  documenting his care.   Please refer to Patient Instructions for Blood Glucose Monitoring and Insulin/Medications Dosing Guide"  in media tab for additional information. Please  also refer to " Patient Self Inventory" in the Media  tab for reviewed elements of pertinent patient history.  [name] participated in the discussions, expressed understanding, and voiced agreement with the above plans.  All questions were answered to his satisfaction. he is encouraged to contact clinic should he have any questions or concerns prior to his return visit.  Follow up plan: Return in about 4 months (around 07/09/2020) for Diabetes follow up with A1c in office, No previsit labs.  Ronny Bacon, FNP-BC [facility] Phone: [phone] Fax: [phone]   03/08/2020, 4:08 PM

## 2020-03-11 DIAGNOSIS — N182 Chronic kidney disease, stage 2 (mild): Secondary | ICD-10-CM | POA: Diagnosis not present

## 2020-03-11 DIAGNOSIS — I129 Hypertensive chronic kidney disease with stage 1 through stage 4 chronic kidney disease, or unspecified chronic kidney disease: Secondary | ICD-10-CM | POA: Diagnosis not present

## 2020-03-11 DIAGNOSIS — E1122 Type 2 diabetes mellitus with diabetic chronic kidney disease: Secondary | ICD-10-CM | POA: Diagnosis not present

## 2020-03-11 DIAGNOSIS — E039 Hypothyroidism, unspecified: Secondary | ICD-10-CM | POA: Diagnosis not present

## 2020-03-22 ENCOUNTER — Other Ambulatory Visit: Payer: Self-pay | Admitting: "Endocrinology

## 2020-03-30 ENCOUNTER — Other Ambulatory Visit: Payer: Self-pay | Admitting: "Endocrinology

## 2020-04-07 ENCOUNTER — Other Ambulatory Visit: Payer: Self-pay | Admitting: "Endocrinology

## 2020-04-07 DIAGNOSIS — G894 Chronic pain syndrome: Secondary | ICD-10-CM | POA: Diagnosis not present

## 2020-04-07 DIAGNOSIS — R0602 Shortness of breath: Secondary | ICD-10-CM | POA: Diagnosis not present

## 2020-04-22 DIAGNOSIS — Z794 Long term (current) use of insulin: Secondary | ICD-10-CM | POA: Diagnosis not present

## 2020-04-22 DIAGNOSIS — E1122 Type 2 diabetes mellitus with diabetic chronic kidney disease: Secondary | ICD-10-CM | POA: Diagnosis not present

## 2020-04-25 ENCOUNTER — Other Ambulatory Visit: Payer: Self-pay | Admitting: "Endocrinology

## 2020-04-30 ENCOUNTER — Other Ambulatory Visit: Payer: Self-pay | Admitting: "Endocrinology

## 2020-05-05 DIAGNOSIS — G894 Chronic pain syndrome: Secondary | ICD-10-CM | POA: Diagnosis not present

## 2020-05-05 DIAGNOSIS — I25810 Atherosclerosis of coronary artery bypass graft(s) without angina pectoris: Secondary | ICD-10-CM | POA: Diagnosis not present

## 2020-05-05 DIAGNOSIS — M5136 Other intervertebral disc degeneration, lumbar region: Secondary | ICD-10-CM | POA: Diagnosis not present

## 2020-05-05 DIAGNOSIS — I25119 Atherosclerotic heart disease of native coronary artery with unspecified angina pectoris: Secondary | ICD-10-CM | POA: Diagnosis not present

## 2020-05-06 ENCOUNTER — Telehealth: Payer: Self-pay

## 2020-05-06 NOTE — Telephone Encounter (Signed)
NOTES ON FILE FROM [facility] [phone], SENT REFERRAL TO SCHEDULING 

## 2020-05-10 ENCOUNTER — Telehealth: Payer: Self-pay | Admitting: Cardiology

## 2020-05-10 NOTE — Telephone Encounter (Signed)
New message:    Patient calling stating that some one called on yesterday. I did not see a note.

## 2020-05-10 NOTE — Telephone Encounter (Signed)
Called patient informed him there are no calls documented. He is wanting a appointment with Dr. Jens Som. I advised him to speak to his pcp and see which office they sent referral to and call them to schedule. No referral on file that I can see. Patient verbally understood. No further questions.

## 2020-05-21 ENCOUNTER — Emergency Department (HOSPITAL_COMMUNITY)
Admission: EM | Admit: 2020-05-21 | Discharge: 2020-05-21 | Disposition: A | Discharge status: Home / Self Care | Payer: Medicare Other | Attending: Emergency Medicine | Admitting: Emergency Medicine

## 2020-05-21 ENCOUNTER — Other Ambulatory Visit: Payer: Self-pay

## 2020-05-21 ENCOUNTER — Encounter (HOSPITAL_COMMUNITY): Payer: Self-pay | Admitting: Emergency Medicine

## 2020-05-21 ENCOUNTER — Emergency Department (HOSPITAL_COMMUNITY): Payer: Medicare Other

## 2020-05-21 DIAGNOSIS — R079 Chest pain, unspecified: Secondary | ICD-10-CM | POA: Insufficient documentation

## 2020-05-21 DIAGNOSIS — I129 Hypertensive chronic kidney disease with stage 1 through stage 4 chronic kidney disease, or unspecified chronic kidney disease: Secondary | ICD-10-CM | POA: Insufficient documentation

## 2020-05-21 DIAGNOSIS — E039 Hypothyroidism, unspecified: Secondary | ICD-10-CM | POA: Insufficient documentation

## 2020-05-21 DIAGNOSIS — Z7982 Long term (current) use of aspirin: Secondary | ICD-10-CM | POA: Insufficient documentation

## 2020-05-21 DIAGNOSIS — J449 Chronic obstructive pulmonary disease, unspecified: Secondary | ICD-10-CM | POA: Insufficient documentation

## 2020-05-21 DIAGNOSIS — N183 Chronic kidney disease, stage 3 unspecified: Secondary | ICD-10-CM | POA: Insufficient documentation

## 2020-05-21 DIAGNOSIS — Z951 Presence of aortocoronary bypass graft: Secondary | ICD-10-CM | POA: Diagnosis not present

## 2020-05-21 DIAGNOSIS — E1122 Type 2 diabetes mellitus with diabetic chronic kidney disease: Secondary | ICD-10-CM | POA: Diagnosis not present

## 2020-05-21 DIAGNOSIS — Z87891 Personal history of nicotine dependence: Secondary | ICD-10-CM | POA: Insufficient documentation

## 2020-05-21 DIAGNOSIS — Z79899 Other long term (current) drug therapy: Secondary | ICD-10-CM | POA: Insufficient documentation

## 2020-05-21 DIAGNOSIS — Z794 Long term (current) use of insulin: Secondary | ICD-10-CM | POA: Diagnosis not present

## 2020-05-21 LAB — BASIC METABOLIC PANEL
Anion gap: 10 (ref 5–15)
BUN: 20 mg/dL (ref 8–23)
CO2: 26 mmol/L (ref 22–32)
Calcium: 9.5 mg/dL (ref 8.9–10.3)
Chloride: 99 mmol/L (ref 98–111)
Creatinine, Ser: 1.15 mg/dL (ref 0.61–1.24)
GFR, Estimated: >60 mL/min (ref >60)
Glucose, Bld: 260 mg/dL — ABNORMAL HIGH (ref 70–99)
Potassium: 4.4 mmol/L (ref 3.5–5.1)
Sodium: 135 mmol/L (ref 135–145)

## 2020-05-21 LAB — CBC
HCT: 44.6 % (ref 39.0–52.0)
Hemoglobin: 14.6 g/dL (ref 13.0–17.0)
MCH: 29.9 pg (ref 26.0–34.0)
MCHC: 32.7 g/dL (ref 30.0–36.0)
MCV: 91.4 fL (ref 80.0–100.0)
Platelets: 237 10*3/uL (ref 150–400)
RBC: 4.88 MIL/uL (ref 4.22–5.81)
RDW: 13.8 % (ref 11.5–15.5)
WBC: 9.4 10*3/uL (ref 4.0–10.5)
nRBC: 0 % (ref 0.0–0.2)

## 2020-05-21 LAB — TROPONIN I (HIGH SENSITIVITY)
Troponin I (High Sensitivity): 8 ng/L (ref <18)
Troponin I (High Sensitivity): 9 ng/L (ref <18)

## 2020-05-21 MED ORDER — METHOCARBAMOL 500 MG PO TABS: 500.0000 mg | ORAL_TABLET | Freq: Three times a day (TID) | ORAL | 0 refills | Status: DC | PRN

## 2020-05-21 NOTE — ED Notes (Signed)
Pt educated on follow up care and medication.  Pt verbalized understanding of care and follow up.  Pt dc ambulating to front loobby  IV Dc'ed

## 2020-05-21 NOTE — ED Provider Notes (Signed)
[facility] EMERGENCY DEPARTMENT Provider Note   CSN: [id number] Arrival date & time: 05/21/20  1342     History Chief Complaint  Patient presents with  . Chest Pain    [name] is a 64 y.o. male.  HPI Patient presents with left-sided chest pain.  Has been on and off for around 3 weeks.  Worse with certain movements.  Worse with palpation.  States he does not do a lot of exercise however.  No swelling his legs.  No fevers.  Occasional rare cough.  Does not feel like his previous MI.  No trauma.  No rash.  Starts Sharp in the back and then goes to the front around his body.  It is on the left side.  Not exertional.  Has had previous CABG.  He does take nitroglycerin at home without any relief    Past Medical History:  Diagnosis Date  . ASCVD (arteriosclerotic cardiovascular disease)    -MI in 01/2001 prompted CABG; EF-30% at cath; 50% on echo in 7/02 and normal in 2005  . Asthma   . Cerebrovascular disease     L CEA 06/2004 following left renal embolism; 10/2008 plaque w/o focal stenosis  . Chronic back pain   . Chronic leg pain   . Chronic obstructive pulmonary disease (HCC)   . Degenerative joint disease     chronic LBP-s/p L3-4 fusion  . Diabetes mellitus    -no insulin  . Hyperlipidemia   . Hypertension   . Hypothyroidism   . Obesity   . Restless leg syndrome    Possible  . Tobacco abuse, in remission    50 pack years; discontinued in 2002    Patient Active Problem List   Diagnosis Date Noted  . Type 2 diabetes mellitus with vascular disease (HCC) 06/03/2015  . ASCVD (arteriosclerotic cardiovascular disease)   . Cerebrovascular disease   . Mixed hyperlipidemia   . Tobacco abuse, in remission   . Hypothyroidism   . Class 3 obesity due to excess calories with serious comorbidity and body mass index (BMI) of 40.0 to 44.9 in adult   . Degenerative joint disease   . Essential hypertension   . Chronic obstructive pulmonary disease (HCC) 12/18/2009  . Type 2  diabetes mellitus with stage 3 chronic kidney disease (HCC) 02/11/2009    Past Surgical History:  Procedure Laterality Date  . CAROTID ENDARTERECTOMY  2005   Left  . CATARACT EXTRACTION     bilateral  . CORONARY ARTERY BYPASS GRAFT  2002  . LUMBAR SPINE SURGERY     L3-4 fusion       No family history on file.  Social History   Tobacco Use  . Smoking status: Former Smoker    Packs/day: 1.00    Years: 50.00    Pack years: 50.00    Types: Cigarettes    Quit date: 01/15/2001    Years since quitting: 19.3  . Smokeless tobacco: Never Used  Vaping Use  . Vaping Use: Never used  Substance Use Topics  . Alcohol use: No  . Drug use: No    Home Medications Prior to Admission medications   Medication Sig Start Date End Date Taking? Authorizing Provider  Acetaminophen (TYLENOL ARTHRITIS EXT RELIEF PO) Take 1-2 tablets by mouth daily as needed (for pain).   Yes [provider]  albuterol (VENTOLIN HFA) 108 (90 Base) MCG/ACT inhaler Inhale into the lungs. 03/05/20  Yes [provider]  albuterol-ipratropium (COMBIVENT) 18-103 MCG/ACT inhaler Inhale  2 puffs into the lungs every 6 (six) hours as needed for wheezing or shortness of breath.    Yes [provider]  aspirin 81 MG tablet Take 81 mg by mouth daily.     Yes [provider]  B Complex-Biotin-FA (VITAMIN B50 COMPLEX PO) Take by mouth daily.   Yes [provider]  Cholecalciferol (VITAMIN D PO) Take 1 capsule by mouth daily.   Yes [provider]  docusate sodium (COLACE) 100 MG capsule Take 100 mg by mouth 2 (two) times daily.     Yes [provider]  escitalopram (LEXAPRO) 20 MG tablet Take 20 mg by mouth daily.     Yes [provider]  fenofibrate 160 MG tablet Take 160 mg by mouth daily.     Yes [provider]  fish oil-omega-3 fatty acids 1000 MG capsule Take 2 g by mouth daily.   Yes [provider]  gabapentin (NEURONTIN) 300 MG  capsule Take 300 mg by mouth at bedtime. 01/14/20  Yes [provider]  GLIPIZIDE XL 5 MG 24 hr tablet TAKE (1) TABLET BY MOUTH EACH MORNING WITH BREAKFAST. 04/07/20  Yes Nida, Denman George, MD  HYDROcodone-acetaminophen (NORCO) 10-325 MG per tablet Take 2 tablets by mouth.    Yes [provider]  insulin lispro (HUMALOG) 100 UNIT/ML KwikPen INJECT 20 TO 26 UNITS INTO THE SKIN 3 TIMES DAILY BEFORE MEALS. 04/25/20  Yes Nida, Denman George, MD  JANUVIA 50 MG tablet TAKE 1 TABLET BY MOUTH DAILY. 05/02/20  Yes Dani Gobble, NP  LANTUS SOLOSTAR 100 UNIT/ML Solostar Pen INJECT 100 UNITS SUBCUTANEOUSLY AT BEDTIME. 03/30/20  Yes Nida, Denman George, MD  levothyroxine (SYNTHROID) 150 MCG tablet Take 1 tablet (150 mcg total) by mouth daily before breakfast. 07/06/19  Yes Nida, Denman George, MD  Multiple Vitamins-Minerals (MULTIVITAMIN WITH MINERALS) tablet Take 1 tablet by mouth daily.     Yes [provider]  ondansetron (ZOFRAN) 4 MG tablet Take 1 tablet (4 mg total) by mouth every 6 (six) hours as needed for nausea or vomiting. 10/29/19  Yes Devoria Albe, MD  rosuvastatin (CRESTOR) 5 MG tablet Take 5 mg by mouth daily. 01/19/20  Yes [provider]  vitamin B-12 (CYANOCOBALAMIN) 100 MCG tablet Take 100 mcg by mouth daily.   Yes [provider]  vitamin C (ASCORBIC ACID) 500 MG tablet Take 500 mg by mouth daily.   Yes [provider]  Continuous Blood Gluc Sensor (FREESTYLE LIBRE 14 DAY SENSOR) MISC Inject 1 each into the skin every 14 (fourteen) days. Use as directed. 10/22/19   Roma Kayser, MD  glucose blood (ACCU-CHEK AVIVA PLUS) test strip Use as directed to check blood glucose four times daily 03/07/20   Roma Kayser, MD  methocarbamol (ROBAXIN) 500 MG tablet Take 1 tablet (500 mg total) by mouth every 8 (eight) hours as needed for muscle spasms. 05/21/20   Benjiman Core, MD  SURE COMFORT PEN NEEDLES 31G X 8 MM MISC USE AS  DIRECTED WITH LANTUS AND NOVOLOG UP TO FOUR TIMES DAILY. 07/16/19   Roma Kayser, MD    Allergies    Alprazolam, Atorvastatin, Gemfibrozil, and Invokana [canagliflozin]  Review of Systems   Review of Systems  Constitutional: Negative for appetite change.  HENT: Negative for congestion.   Respiratory: Negative for shortness of breath.   Cardiovascular: Positive for chest pain. Negative for leg swelling.  Gastrointestinal: Negative for abdominal pain.  Musculoskeletal: Negative for back pain.  Skin: Negative for rash.  Neurological: Negative for weakness.    Physical Exam Updated Vital Signs BP (!) 152/89   Pulse 83   Temp 97.8 F (36.6 C) (Oral)   Resp 11   Ht 5\' 9"  (1.753 m)   Wt 124.3 kg   SpO2 98%   BMI 40.47 kg/m   Physical Exam Vitals and nursing note reviewed.  Constitutional:      Appearance: He is obese.  HENT:     Head: Atraumatic.  Cardiovascular:     Rate and Rhythm: Normal rate and regular rhythm.  Pulmonary:     Breath sounds: No wheezing or rhonchi.  Chest:     Chest wall: Tenderness present.     Comments: Tenderness to left anterior chest and to posterior paraspinal area.  No rash.  No ecchymosis. Musculoskeletal:     Cervical back: Neck supple.     Right lower leg: No edema.     Left lower leg: No edema.  Skin:    General: Skin is warm.     Capillary Refill: Capillary refill takes less than 2 seconds.  Neurological:     Mental Status: He is alert and oriented to person, place, and time.     ED Results / Procedures / Treatments   Labs (all labs ordered are listed, but only abnormal results are displayed) Labs Reviewed  BASIC METABOLIC PANEL - Abnormal; Notable for the following components:      Result Value   Glucose, Bld 260 (*)    All other components within normal limits  CBC  TROPONIN I (HIGH SENSITIVITY)  TROPONIN I (HIGH SENSITIVITY)    EKG EKG Interpretation  Date/Time:  Saturday May 21 2020 14:00:25  EDT Ventricular Rate:  86 PR Interval:  178 QRS Duration: 70 QT Interval:  332 QTC Calculation: 397 R Axis:   42 Text Interpretation: Normal sinus rhythm Possible Left atrial enlargement Septal infarct , age undetermined Abnormal ECG Confirmed by [id number]) on 05/21/2020 2:57:35 PM   Radiology DG Chest 2 View  Result Date: 05/21/2020 CLINICAL DATA:  Chest pain for 3 weeks EXAM: CHEST - 2 VIEW COMPARISON:  October 29, 2019 FINDINGS: The heart size and mediastinal contours are stable. Patient status post prior sternotomy and CABG. Both lungs are clear. The visualized skeletal structures are unremarkable. IMPRESSION: No active cardiopulmonary disease. Electronically Signed   By: October 31, 2019 M.D.   On: 05/21/2020 15:07    Procedures Procedures (including critical care time)  Medications Ordered in ED Medications - No data to display  ED Course  I have reviewed the triage vital signs and the nursing notes.  Pertinent labs & imaging results that were available during my care of the patient were reviewed by me and considered in my medical decision making (see chart for details).    MDM Rules/Calculators/A&P                          Patient with left-sided chest pain.  Has had for around 3 weeks.  Hits it very sharp at times.  Reproducible.  Worse with movement.  I think more likely musculoskeletal.  Would have thought it could be a zoster however pain has been there for around 3 weeks without rash.  Will treat with some muscle relaxer at this time.  EKG reassuring and troponin negative x2.  X-ray reassuring.  Doubt pulmonary embolism.  Discharge home. Final Clinical Impression(s) / ED Diagnoses Final diagnoses:  Nonspecific chest pain    Rx / DC Orders ED Discharge Orders         Ordered    methocarbamol (ROBAXIN) 500 MG tablet  Every 8 hours PRN        05/21/20 1745           Benjiman CorePickering, Cypress Hinkson, MD 05/21/20 1749

## 2020-05-21 NOTE — ED Triage Notes (Signed)
Pt c/o intermittent chest pain x 3 weeks, hx of quadruple bypass, pt reports tender to left breast and has a pain in his back that is constant

## 2020-05-23 DIAGNOSIS — E1122 Type 2 diabetes mellitus with diabetic chronic kidney disease: Secondary | ICD-10-CM | POA: Diagnosis not present

## 2020-05-23 DIAGNOSIS — Z794 Long term (current) use of insulin: Secondary | ICD-10-CM | POA: Diagnosis not present

## 2020-05-25 ENCOUNTER — Other Ambulatory Visit: Payer: Self-pay | Admitting: "Endocrinology

## 2020-06-02 DIAGNOSIS — R0789 Other chest pain: Secondary | ICD-10-CM | POA: Diagnosis not present

## 2020-06-02 DIAGNOSIS — G894 Chronic pain syndrome: Secondary | ICD-10-CM | POA: Diagnosis not present

## 2020-06-07 NOTE — Progress Notes (Signed)
HPI: Follow-up coronary artery disease.  Previously followed by Dr. Purvis Sheffield.  Patient is status post coronary artery bypass and graft in 2002.  Patient is status post carotid endarterectomy.  Carotid Dopplers February 2020 showed minimal plaque.  Nuclear study February 2020 showed ejection fraction 56%, anterior scar but no ischemia.  Since last seen he has some dyspnea on exertion but no orthopnea, PND or pedal edema.  He has occasional "pain" of chest pain but no other symptoms.  These typically last approximately 1 second and resolved.  He denies syncope.  Current Outpatient Medications  Medication Sig Dispense Refill  . Acetaminophen (TYLENOL ARTHRITIS EXT RELIEF PO) Take 1-2 tablets by mouth daily as needed (for pain).    Marland Kitchen albuterol (VENTOLIN HFA) 108 (90 Base) MCG/ACT inhaler Inhale into the lungs.    Marland Kitchen albuterol-ipratropium (COMBIVENT) 18-103 MCG/ACT inhaler Inhale 2 puffs into the lungs every 6 (six) hours as needed for wheezing or shortness of breath.     Marland Kitchen aspirin 81 MG tablet Take 81 mg by mouth daily.      . B Complex-Biotin-FA (VITAMIN B50 COMPLEX PO) Take by mouth daily.    . Cholecalciferol (VITAMIN D PO) Take 1 capsule by mouth daily.    . Continuous Blood Gluc Sensor (FREESTYLE LIBRE 14 DAY SENSOR) MISC Inject 1 each into the skin every 14 (fourteen) days. Use as directed. 2 each 2  . docusate sodium (COLACE) 100 MG capsule Take 100 mg by mouth 2 (two) times daily.      Marland Kitchen escitalopram (LEXAPRO) 20 MG tablet Take 20 mg by mouth daily.      . fish oil-omega-3 fatty acids 1000 MG capsule Take 2 g by mouth daily.    Marland Kitchen GLIPIZIDE XL 5 MG 24 hr tablet TAKE (1) TABLET BY MOUTH EACH MORNING WITH BREAKFAST. 90 tablet 0  . glucose blood (ACCU-CHEK AVIVA PLUS) test strip Use as directed to check blood glucose four times daily 400 strip 1  . HYDROcodone-acetaminophen (NORCO) 10-325 MG per tablet Take 2 tablets by mouth.     . insulin lispro (HUMALOG) 100 UNIT/ML KwikPen INJECT 20  TO 26 UNITS INTO THE SKIN 3 TIMES DAILY BEFORE MEALS. 30 mL 0  . JANUVIA 50 MG tablet TAKE 1 TABLET BY MOUTH DAILY. 90 tablet 0  . LANTUS SOLOSTAR 100 UNIT/ML Solostar Pen INJECT 100 UNITS SUBCUTANEOUSLY AT BEDTIME. 90 mL 0  . levothyroxine (SYNTHROID) 150 MCG tablet Take 1 tablet (150 mcg total) by mouth daily before breakfast. 30 tablet 2  . Multiple Vitamins-Minerals (MULTIVITAMIN WITH MINERALS) tablet Take 1 tablet by mouth daily.      . ondansetron (ZOFRAN) 4 MG tablet Take 1 tablet (4 mg total) by mouth every 6 (six) hours as needed for nausea or vomiting. 12 tablet 0  . rosuvastatin (CRESTOR) 5 MG tablet Take 5 mg by mouth daily.    . SURE COMFORT PEN NEEDLES 31G X 8 MM MISC USE AS DIRECTED WITH LANTUS AND NOVOLOG UP TO FOUR TIMES DAILY. 100 each 5  . vitamin B-12 (CYANOCOBALAMIN) 100 MCG tablet Take 100 mcg by mouth daily.    . vitamin C (ASCORBIC ACID) 500 MG tablet Take 500 mg by mouth daily.     No current facility-administered medications for this visit.     Past Medical History:  Diagnosis Date  . ASCVD (arteriosclerotic cardiovascular disease)    -MI in 01/2001 prompted CABG; EF-30% at cath; 50% on echo in 7/02 and normal in 2005  .  Asthma   . Cerebrovascular disease     L CEA 06/2004 following left renal embolism; 10/2008 plaque w/o focal stenosis  . Chronic back pain   . Chronic leg pain   . Chronic obstructive pulmonary disease (HCC)   . Degenerative joint disease     chronic LBP-s/p L3-4 fusion  . Diabetes mellitus    -no insulin  . Hyperlipidemia   . Hypertension   . Hypothyroidism   . Obesity   . Restless leg syndrome    Possible  . Tobacco abuse, in remission    50 pack years; discontinued in 2002    Past Surgical History:  Procedure Laterality Date  . CAROTID ENDARTERECTOMY  2005   Left  . CATARACT EXTRACTION     bilateral  . CORONARY ARTERY BYPASS GRAFT  2002  . LUMBAR SPINE SURGERY     L3-4 fusion    Social History   Socioeconomic History  .  Marital status: Divorced    Spouse name: Not on file  . Number of children: 1  . Years of education: Not on file  . Highest education level: Not on file  Occupational History  . Occupation: veteran    Comment: disabledf  Tobacco Use  . Smoking status: Former Smoker    Packs/day: 1.00    Years: 50.00    Pack years: 50.00    Types: Cigarettes    Quit date: 01/15/2001    Years since quitting: 19.4  . Smokeless tobacco: Never Used  Vaping Use  . Vaping Use: Never used  Substance and Sexual Activity  . Alcohol use: No  . Drug use: No  . Sexual activity: Not on file  Other Topics Concern  . Not on file  Social History Narrative  . Not on file   Social Determinants of Health   Financial Resource Strain:   . Difficulty of Paying Living Expenses: Not on file  Food Insecurity:   . Worried About Programme researcher, broadcasting/film/video in the Last Year: Not on file  . Ran Out of Food in the Last Year: Not on file  Transportation Needs:   . Lack of Transportation (Medical): Not on file  . Lack of Transportation (Non-Medical): Not on file  Physical Activity:   . Days of Exercise per Week: Not on file  . Minutes of Exercise per Session: Not on file  Stress:   . Feeling of Stress : Not on file  Social Connections:   . Frequency of Communication with Friends and Family: Not on file  . Frequency of Social Gatherings with Friends and Family: Not on file  . Attends Religious Services: Not on file  . Active Member of Clubs or Organizations: Not on file  . Attends Banker Meetings: Not on file  . Marital Status: Not on file  Intimate Partner Violence:   . Fear of Current or Ex-Partner: Not on file  . Emotionally Abused: Not on file  . Physically Abused: Not on file  . Sexually Abused: Not on file    History reviewed. No pertinent family history.  ROS: Back pain but no fevers or chills, productive cough, hemoptysis, dysphasia, odynophagia, melena, hematochezia, dysuria, hematuria, rash,  seizure activity, orthopnea, PND, pedal edema, claudication. Remaining systems are negative.  Physical Exam: Well-developed obese in no acute distress.  Skin is warm and dry.  HEENT is normal.  Neck is supple.  Chest is clear to auscultation with normal expansion.  Cardiovascular exam is regular rate and rhythm.  Abdominal exam nontender or distended. No masses palpated. Extremities show no edema. neuro grossly intact  ECG-May 21, 2020-sinus rhythm with no ST changes.  Personally reviewed  A/P  1 coronary artery disease status post coronary artery bypass and graft-patient denies chest pain.  Plan to continue medical therapy with aspirin and statin.  Schedule echocardiogram to assess LV function.  Note most recent nuclear study showed no ischemia.  2 hypertension-patient's blood pressure is elevated.  Add losartan 50 mg daily.  Check potassium and renal function in 1 week.  Follow blood pressure and adjust regimen as needed.  3 hyperlipidemia-given history of coronary disease I will increase Crestor to 40 mg daily.  Check lipids and liver in 12 weeks.  4 history of carotid endarterectomy-plan repeat carotid Dopplers.  Olga Millers, MD

## 2020-06-11 DIAGNOSIS — E7849 Other hyperlipidemia: Secondary | ICD-10-CM | POA: Diagnosis not present

## 2020-06-11 DIAGNOSIS — E1122 Type 2 diabetes mellitus with diabetic chronic kidney disease: Secondary | ICD-10-CM | POA: Diagnosis not present

## 2020-06-11 DIAGNOSIS — J449 Chronic obstructive pulmonary disease, unspecified: Secondary | ICD-10-CM | POA: Diagnosis not present

## 2020-06-11 DIAGNOSIS — I10 Essential (primary) hypertension: Secondary | ICD-10-CM | POA: Diagnosis not present

## 2020-06-13 ENCOUNTER — Encounter: Payer: Self-pay | Admitting: Cardiology

## 2020-06-13 ENCOUNTER — Ambulatory Visit (INDEPENDENT_AMBULATORY_CARE_PROVIDER_SITE_OTHER): Payer: Medicare Other | Admitting: Cardiology

## 2020-06-13 ENCOUNTER — Other Ambulatory Visit: Payer: Self-pay

## 2020-06-13 VITALS — BP 164/88 | HR 93 | Ht 69.0 in | Wt 284.0 lb

## 2020-06-13 DIAGNOSIS — I2510 Atherosclerotic heart disease of native coronary artery without angina pectoris: Secondary | ICD-10-CM

## 2020-06-13 DIAGNOSIS — E7800 Pure hypercholesterolemia, unspecified: Secondary | ICD-10-CM | POA: Diagnosis not present

## 2020-06-13 DIAGNOSIS — Z9889 Other specified postprocedural states: Secondary | ICD-10-CM | POA: Diagnosis not present

## 2020-06-13 DIAGNOSIS — I10 Essential (primary) hypertension: Secondary | ICD-10-CM

## 2020-06-13 MED ORDER — ROSUVASTATIN CALCIUM 40 MG PO TABS: 40.0000 mg | ORAL_TABLET | Freq: Every day | ORAL | 3 refills | Status: DC

## 2020-06-13 MED ORDER — LOSARTAN POTASSIUM 50 MG PO TABS: 50.0000 mg | ORAL_TABLET | Freq: Every day | ORAL | 3 refills | Status: DC

## 2020-06-13 NOTE — Patient Instructions (Signed)
Medication Instructions:   START LOSARTAN 50 MG ONCE DAILY  INCREASE ROSUVASTATIN TO 40 MG ONCE DAILY  *If you need a refill on your cardiac medications before your next appointment, please call your pharmacy*   Lab Work:  Your physician recommends that you return for lab work in: ONE WEEK  Your physician recommends that you return for lab work in: 12 [facility]  If you have labs (blood work) drawn today and your tests are completely normal, you will receive your results only by: Marland Kitchen MyChart Message (if you have MyChart) OR . A paper copy in the mail If you have any lab test that is abnormal or we need to change your treatment, we will call you to review the results.   Testing/Procedures:  Your physician has requested that you have an echocardiogram. Echocardiography is a painless test that uses sound waves to create images of your heart. It provides your doctor with information about the size and shape of your heart and how well your heart's chambers and valves are working. This procedure takes approximately one hour. There are no restrictions for this procedure.[address] [facility]   Your physician has requested that you have a carotid duplex. This test is an ultrasound of the carotid arteries in your neck. It looks at blood flow through these arteries that supply the brain with blood. Allow one hour for this exam. There are no restrictions or special instructions.[facility]     Follow-Up: At [facility], you and your health needs are our priority.  As part of our continuing mission to provide you with exceptional heart care, we have created designated Provider Care Teams.  These Care Teams include your primary Cardiologist (physician) and Advanced Practice Providers (APPs -  Physician Assistants and Nurse Practitioners) who all work together to provide you with the care you need, when you need it.  We recommend signing up for the patient portal called "MyChart".  Sign  up information is provided on this After Visit Summary.  MyChart is used to connect with patients for Virtual Visits (Telemedicine).  Patients are able to view lab/test results, encounter notes, upcoming appointments, etc.  Non-urgent messages can be sent to your provider as well.   To learn more about what you can do with MyChart, go to [email or web address].    Your next appointment:   6 month(s)  The format for your next appointment:   In Person  Provider:   Olga Millers, MD

## 2020-06-20 ENCOUNTER — Ambulatory Visit (HOSPITAL_COMMUNITY)
Admission: RE | Admit: 2020-06-20 | Discharge: 2020-06-20 | Disposition: A | Discharge status: Home / Self Care | Payer: Medicare Other | Source: Ambulatory Visit | Attending: Cardiology | Admitting: Cardiology

## 2020-06-20 ENCOUNTER — Other Ambulatory Visit: Payer: Self-pay

## 2020-06-20 DIAGNOSIS — I2510 Atherosclerotic heart disease of native coronary artery without angina pectoris: Secondary | ICD-10-CM

## 2020-06-20 LAB — ECHOCARDIOGRAM COMPLETE
Area-P 1/2: 3.06 cm2
S' Lateral: 3.63 cm

## 2020-06-20 MED ORDER — PERFLUTREN LIPID MICROSPHERE
1.0000 mL | INTRAVENOUS | Status: AC | PRN
  Administered 2020-06-20: 1 mL via INTRAVENOUS
  Filled 2020-06-20: qty 10

## 2020-06-20 NOTE — Progress Notes (Signed)
*  PRELIMINARY RESULTS* Echocardiogram 2D Echocardiogram with definity has been performed. Pre definity 122/67 post definity 142/66 Pt reported very mild headache with definity that cleared within 10 minutes.  [name] 06/20/2020, 3:15 PM

## 2020-06-22 DIAGNOSIS — I10 Essential (primary) hypertension: Secondary | ICD-10-CM | POA: Diagnosis not present

## 2020-06-23 ENCOUNTER — Encounter: Payer: Self-pay | Admitting: *Deleted

## 2020-06-23 DIAGNOSIS — E1122 Type 2 diabetes mellitus with diabetic chronic kidney disease: Secondary | ICD-10-CM | POA: Diagnosis not present

## 2020-06-23 DIAGNOSIS — Z794 Long term (current) use of insulin: Secondary | ICD-10-CM | POA: Diagnosis not present

## 2020-06-23 LAB — BASIC METABOLIC PANEL
BUN/Creatinine Ratio: 17 (ref 10–24)
BUN: 22 mg/dL (ref 8–27)
CO2: 24 mmol/L (ref 20–29)
Calcium: 9.8 mg/dL (ref 8.6–10.2)
Chloride: 96 mmol/L (ref 96–106)
Creatinine, Ser: 1.27 mg/dL (ref 0.76–1.27)
GFR calc Af Amer: 69 mL/min/{1.73_m2} (ref >59)
GFR calc non Af Amer: 59 mL/min/{1.73_m2} — ABNORMAL LOW (ref >59)
Glucose: 148 mg/dL — ABNORMAL HIGH (ref 65–99)
Potassium: 4.9 mmol/L (ref 3.5–5.2)
Sodium: 135 mmol/L (ref 134–144)

## 2020-06-27 ENCOUNTER — Telehealth: Payer: Self-pay | Admitting: Cardiology

## 2020-06-27 NOTE — Telephone Encounter (Signed)
New message:     Patient calling stating that some one called him I did not see a note 

## 2020-06-27 NOTE — Telephone Encounter (Signed)
Alan Bunting, MD 06/23/2020 7:15 AM EST  No change in meds [name]  Left detailed message as noted for patient (DPR), call back w/any questions

## 2020-06-28 ENCOUNTER — Other Ambulatory Visit: Payer: Self-pay

## 2020-06-28 ENCOUNTER — Ambulatory Visit (HOSPITAL_COMMUNITY)
Admission: RE | Admit: 2020-06-28 | Discharge: 2020-06-28 | Disposition: A | Discharge status: Home / Self Care | Payer: Medicare Other | Source: Ambulatory Visit | Attending: Cardiovascular Disease | Admitting: Cardiovascular Disease

## 2020-06-28 DIAGNOSIS — Z8673 Personal history of transient ischemic attack (TIA), and cerebral infarction without residual deficits: Secondary | ICD-10-CM | POA: Diagnosis not present

## 2020-06-28 DIAGNOSIS — Z9889 Other specified postprocedural states: Secondary | ICD-10-CM | POA: Diagnosis not present

## 2020-06-30 DIAGNOSIS — E119 Type 2 diabetes mellitus without complications: Secondary | ICD-10-CM | POA: Diagnosis not present

## 2020-06-30 DIAGNOSIS — J449 Chronic obstructive pulmonary disease, unspecified: Secondary | ICD-10-CM | POA: Diagnosis not present

## 2020-06-30 DIAGNOSIS — G8929 Other chronic pain: Secondary | ICD-10-CM | POA: Diagnosis not present

## 2020-06-30 DIAGNOSIS — H6121 Impacted cerumen, right ear: Secondary | ICD-10-CM | POA: Diagnosis not present

## 2020-06-30 DIAGNOSIS — M5136 Other intervertebral disc degeneration, lumbar region: Secondary | ICD-10-CM | POA: Diagnosis not present

## 2020-07-11 ENCOUNTER — Other Ambulatory Visit: Payer: Self-pay

## 2020-07-11 ENCOUNTER — Ambulatory Visit: Payer: Medicare Other | Admitting: Nurse Practitioner

## 2020-07-11 ENCOUNTER — Other Ambulatory Visit: Payer: Self-pay | Admitting: Nurse Practitioner

## 2020-07-11 ENCOUNTER — Encounter: Payer: Self-pay | Admitting: Nurse Practitioner

## 2020-07-11 VITALS — BP 116/66 | HR 64

## 2020-07-11 DIAGNOSIS — E1159 Type 2 diabetes mellitus with other circulatory complications: Secondary | ICD-10-CM

## 2020-07-11 DIAGNOSIS — E782 Mixed hyperlipidemia: Secondary | ICD-10-CM | POA: Diagnosis not present

## 2020-07-11 DIAGNOSIS — E559 Vitamin D deficiency, unspecified: Secondary | ICD-10-CM

## 2020-07-11 DIAGNOSIS — E1122 Type 2 diabetes mellitus with diabetic chronic kidney disease: Secondary | ICD-10-CM | POA: Diagnosis not present

## 2020-07-11 DIAGNOSIS — I10 Essential (primary) hypertension: Secondary | ICD-10-CM | POA: Diagnosis not present

## 2020-07-11 DIAGNOSIS — E039 Hypothyroidism, unspecified: Secondary | ICD-10-CM | POA: Diagnosis not present

## 2020-07-11 LAB — POCT GLYCOSYLATED HEMOGLOBIN (HGB A1C): HbA1c, POC (controlled diabetic range): 8 % — AB (ref 0.0–7.0)

## 2020-07-11 MED ORDER — INSULIN LISPRO (1 UNIT DIAL) 100 UNIT/ML (KWIKPEN): 20.0000 [IU] | PEN_INJECTOR | Freq: Three times a day (TID) | SUBCUTANEOUS | 3 refills | Status: DC

## 2020-07-11 MED ORDER — LANTUS SOLOSTAR 100 UNIT/ML ~~LOC~~ SOPN: 90.0000 [IU] | PEN_INJECTOR | Freq: Every day | SUBCUTANEOUS | 2 refills | Status: DC

## 2020-07-11 NOTE — Patient Instructions (Signed)

## 2020-07-11 NOTE — Progress Notes (Signed)
07/11/2020   Endocrinology follow-up note   Subjective:    Patient ID: [name], male    DOB: 09-May-1956,    Past Medical History:  Diagnosis Date  . ASCVD (arteriosclerotic cardiovascular disease)    -MI in 01/2001 prompted CABG; EF-30% at cath; 50% on echo in 7/02 and normal in 2005  . Asthma   . Cerebrovascular disease     L CEA 06/2004 following left renal embolism; 10/2008 plaque w/o focal stenosis  . Chronic back pain   . Chronic leg pain   . Chronic obstructive pulmonary disease (HCC)   . Degenerative joint disease     chronic LBP-s/p L3-4 fusion  . Diabetes mellitus    -no insulin  . Hyperlipidemia   . Hypertension   . Hypothyroidism   . Obesity   . Restless leg syndrome    Possible  . Tobacco abuse, in remission    50 pack years; discontinued in 2002   Past Surgical History:  Procedure Laterality Date  . CAROTID ENDARTERECTOMY  2005   Left  . CATARACT EXTRACTION     bilateral  . CORONARY ARTERY BYPASS GRAFT  2002  . LUMBAR SPINE SURGERY     L3-4 fusion   Social History   Socioeconomic History  . Marital status: Divorced    Spouse name: Not on file  . Number of children: 1  . Years of education: Not on file  . Highest education level: Not on file  Occupational History  . Occupation: veteran    Comment: disabledf  Tobacco Use  . Smoking status: Former Smoker    Packs/day: 1.00    Years: 50.00    Pack years: 50.00    Types: Cigarettes    Quit date: 01/15/2001    Years since quitting: 19.4  . Smokeless tobacco: Never Used  Vaping Use  . Vaping Use: Never used  Substance and Sexual Activity  . Alcohol use: No  . Drug use: No  . Sexual activity: Not on file  Other Topics Concern  . Not on file  Social History Narrative  . Not on file   Social Determinants of Health   Financial Resource Strain:   . Difficulty of Paying Living Expenses: Not on file  Food Insecurity:   . Worried About Programme researcher, broadcasting/film/video in the Last Year: Not on  file  . Ran Out of Food in the Last Year: Not on file  Transportation Needs:   . Lack of Transportation (Medical): Not on file  . Lack of Transportation (Non-Medical): Not on file  Physical Activity:   . Days of Exercise per Week: Not on file  . Minutes of Exercise per Session: Not on file  Stress:   . Feeling of Stress : Not on file  Social Connections:   . Frequency of Communication with Friends and Family: Not on file  . Frequency of Social Gatherings with Friends and Family: Not on file  . Attends Religious Services: Not on file  . Active Member of Clubs or Organizations: Not on file  . Attends Banker Meetings: Not on file  . Marital Status: Not on file   Outpatient Encounter Medications as of 07/11/2020  Medication Sig  . Acetaminophen (TYLENOL ARTHRITIS EXT RELIEF PO) Take 1-2 tablets by mouth daily as needed (for pain).  Marland Kitchen albuterol (VENTOLIN HFA) 108 (90 Base) MCG/ACT inhaler Inhale into the lungs.  Marland Kitchen albuterol-ipratropium (COMBIVENT) 18-103 MCG/ACT inhaler Inhale 2 puffs into the lungs every  6 (six) hours as needed for wheezing or shortness of breath.   Marland Kitchen. aspirin 81 MG tablet Take 81 mg by mouth daily.    . B Complex-Biotin-FA (VITAMIN B50 COMPLEX PO) Take by mouth daily.  . Cholecalciferol (VITAMIN D PO) Take 1 capsule by mouth daily.  . Continuous Blood Gluc Sensor (FREESTYLE LIBRE 14 DAY SENSOR) MISC Inject 1 each into the skin every 14 (fourteen) days. Use as directed.  . docusate sodium (COLACE) 100 MG capsule Take 100 mg by mouth 2 (two) times daily.    Marland Kitchen. escitalopram (LEXAPRO) 20 MG tablet Take 20 mg by mouth daily.    . fish oil-omega-3 fatty acids 1000 MG capsule Take 2 g by mouth daily.  Marland Kitchen. GLIPIZIDE XL 5 MG 24 hr tablet TAKE (1) TABLET BY MOUTH EACH MORNING WITH BREAKFAST.  Marland Kitchen. glucose blood (ACCU-CHEK AVIVA PLUS) test strip Use as directed to check blood glucose four times daily  . HYDROcodone-acetaminophen (NORCO) 10-325 MG per tablet Take 2 tablets by  mouth.   . insulin glargine (LANTUS SOLOSTAR) 100 UNIT/ML Solostar Pen Inject 90 Units into the skin at bedtime.  . insulin lispro (HUMALOG) 100 UNIT/ML KwikPen Inject 20-26 Units into the skin 3 (three) times daily. If glucose is above 90 and he is eating  . JANUVIA 50 MG tablet TAKE 1 TABLET BY MOUTH DAILY.  Marland Kitchen. levothyroxine (SYNTHROID) 150 MCG tablet Take 1 tablet (150 mcg total) by mouth daily before breakfast.  . losartan (COZAAR) 50 MG tablet Take 1 tablet (50 mg total) by mouth daily.  . methocarbamol (ROBAXIN) 500 MG tablet Take by mouth.  . Multiple Vitamins-Minerals (MULTIVITAMIN WITH MINERALS) tablet Take 1 tablet by mouth daily.    . nitroGLYCERIN (NITROSTAT) 0.4 MG SL tablet Place under the tongue.  . ondansetron (ZOFRAN) 4 MG tablet Take 1 tablet (4 mg total) by mouth every 6 (six) hours as needed for nausea or vomiting.  . rosuvastatin (CRESTOR) 40 MG tablet Take 1 tablet (40 mg total) by mouth daily.  . SURE COMFORT PEN NEEDLES 31G X 8 MM MISC USE AS DIRECTED WITH LANTUS AND NOVOLOG UP TO FOUR TIMES DAILY.  . vitamin B-12 (CYANOCOBALAMIN) 100 MCG tablet Take 100 mcg by mouth daily.  . vitamin C (ASCORBIC ACID) 500 MG tablet Take 500 mg by mouth daily.  . [DISCONTINUED] insulin lispro (HUMALOG) 100 UNIT/ML KwikPen INJECT 20 TO 26 UNITS INTO THE SKIN 3 TIMES DAILY BEFORE MEALS.  . [DISCONTINUED] LANTUS SOLOSTAR 100 UNIT/ML Solostar Pen INJECT 100 UNITS SUBCUTANEOUSLY AT BEDTIME.  . [DISCONTINUED] insulin lispro (HUMALOG) 100 UNIT/ML KwikPen INJECT 20 TO 26 UNITS INTO THE SKIN 3 TIMES DAILY BEFORE MEALS.   No facility-administered encounter medications on file as of 07/11/2020.   ALLERGIES: Allergies  Allergen Reactions  . Alprazolam Nausea And Vomiting and Other (See Comments)    Altered mental status  . Atorvastatin Nausea And Vomiting  . Gemfibrozil     unknown  . Invokana [Canagliflozin] Diarrhea and Nausea And Vomiting  . Definity [Perflutren Lipid Microsphere] Other  (See Comments)    Headache   VACCINATION STATUS: Immunization History  Administered Date(s) Administered  . Influenza-Unspecified 05/13/2014    Diabetes He presents for his follow-up diabetic visit. He has type 2 diabetes mellitus. Onset time: He was diagnosed at approximate age of 50 years. His disease course has been worsening. Hypoglycemia symptoms include sweats. Pertinent negatives for hypoglycemia include no confusion, headaches, nervousness/anxiousness, pallor or tremors. Associated symptoms include foot paresthesias. Pertinent negatives for  diabetes include no fatigue, no polydipsia, no polyphagia and no polyuria. There are no hypoglycemic complications. Symptoms are worsening. Diabetic complications include a CVA, heart disease, nephropathy and peripheral neuropathy. Risk factors for coronary artery disease include diabetes mellitus, dyslipidemia, hypertension, male sex, tobacco exposure, sedentary lifestyle and obesity. Current diabetic treatment includes oral agent (dual therapy) and intensive insulin program. He is compliant with treatment most of the time. His weight is stable. He is following a diabetic and generally healthy diet. Meal planning includes avoidance of concentrated sweets. He has had a previous visit with a dietitian. He participates in exercise intermittently. His home blood glucose trend is decreasing steadily. His breakfast blood glucose range is generally 90-110 mg/dl. His lunch blood glucose range is generally 140-180 mg/dl. His dinner blood glucose range is generally 130-140 mg/dl. His bedtime blood glucose range is generally 110-130 mg/dl. (He presents with his CGM and logs showing near target fasting and postprandial glycemic profile.  His POCT A1c today is 8%, increasing from previous visit of 7.3%.  Analysis of his CGM shows TIR 68%, TAR 26%, TBR 6% (most of which are overnight or before dinner).  He reports he sometimes skips lunch. ) An ACE inhibitor/angiotensin II  receptor blocker is being taken. He does not see a podiatrist.Eye exam is current.  Hyperlipidemia This is a chronic problem. The current episode started more than 1 year ago. The problem is uncontrolled. Recent lipid tests were reviewed and are high. Exacerbating diseases include chronic renal disease, diabetes, hypothyroidism and obesity. There are no known factors aggravating his hyperlipidemia. Associated symptoms include myalgias. Current antihyperlipidemic treatment includes statins. The current treatment provides moderate improvement of lipids. There are no compliance problems.  Risk factors for coronary artery disease include family history, dyslipidemia, diabetes mellitus, hypertension, male sex, a sedentary lifestyle and obesity.  Thyroid Problem Presents for follow-up (He has longstanding hypothyroidism, currently on levothyroxine 150 mcg p.o. daily before breakfast.  He reports compliance with medication.) visit. Patient reports no anxiety, cold intolerance, constipation, depressed mood, diarrhea, fatigue, heat intolerance, palpitations or tremors. The symptoms have been stable. His past medical history is significant for diabetes and hyperlipidemia.   Review of systems  Constitutional: + Minimally fluctuating body weight,  current There is no height or weight on file to calculate BMI. , no fatigue, no subjective hyperthermia, no subjective hypothermia Eyes: no blurry vision, no xerophthalmia ENT: no sore throat, no nodules palpated in throat, no dysphagia/odynophagia, no hoarseness Cardiovascular: no chest pain, no shortness of breath, no palpitations, no leg swelling Respiratory: no cough, no shortness of breath Gastrointestinal: no nausea/vomiting/diarrhea Musculoskeletal: no muscle/joint aches, complains of intermittent cramping in BLE at night Skin: no rashes, no hyperemia Neurological: no tremors, no numbness, no tingling, no dizziness, some numbness to right anterior  foot Psychiatric: no depression, no anxiety  Objective:    BP 116/66 (BP Location: Left Arm)   Pulse 64   Wt Readings from Last 3 Encounters:  06/13/20 284 lb (128.8 kg)  05/21/20 274 lb 0.5 oz (124.3 kg)  03/08/20 (!) 274 lb (124.3 kg)   BP Readings from Last 3 Encounters:  07/11/20 116/66  06/13/20 (!) 164/88  05/21/20 (!) 158/88   Physical Exam- Limited  Constitutional:  There is no height or weight on file to calculate BMI. , not in acute distress, normal state of mind Eyes:  EOMI, no exophthalmos Neck: Supple Thyroid: No gross goiter Cardiovascular: RRR, no murmers, rubs, or gallops  Respiratory: Adequate breathing efforts, no crackles, rales,  rhonchi Musculoskeletal: no gross deformities, strength intact in all four extremities, no gross restriction of joint movements Skin:  no rashes, no hyperemia Neurological: no tremor with outstretched hands,    POCT ABI Results 07/11/20   Right ABI:  0.92 (borderline)      Left ABI:  1.09  Right leg systolic / diastolic: 107/50 mmHg Left leg systolic / diastolic: 126/54 mmHg  Arm systolic / diastolic: 116/66 mmHG  Detailed report will be scanned into patient chart.    Results for orders placed or performed in visit on 07/11/20  HgB A1c  Result Value Ref Range   Hemoglobin A1C     HbA1c POC (<> result, manual entry)     HbA1c, POC (prediabetic range)     HbA1c, POC (controlled diabetic range) 8.0 (A) 0.0 - 7.0 %   Lab Results  Component Value Date   WBC 9.4 05/21/2020   HGB 14.6 05/21/2020   HCT 44.6 05/21/2020   MCV 91.4 05/21/2020   PLT 237 05/21/2020    Lab Results  Component Value Date   HGBA1C 8.0 (A) 07/11/2020   HGBA1C 7.3 (A) 03/08/2020   HGBA1C 9.5 10/14/2019     Lipid Panel     Component Value Date/Time   CHOL 210 (H) 02/26/2020 1002   CHOL 181 07/17/2017 0757   TRIG 173 (H) 02/26/2020 1002   TRIG 329 10/02/2007 0000   HDL 43 02/26/2020 1002   HDL 35 (L) 07/17/2017 0757   CHOLHDL 4.9  02/26/2020 1002   VLDL 19 09/09/2018 0949   LDLCALC 136 (H) 02/26/2020 1002   LDLCALC 66 10/02/2007 0000     Assessment & Plan:   1. Type 2 diabetes mellitus with stage 3 chronic kidney disease, with long-term current use of insulin   His diabetes is  complicated by  CAD, CKD.CVA, Retinopathy.   He presents with his CGM and logs showing near target fasting and postprandial glycemic profile.  His POCT A1c today is 8%, increasing from previous visit of 7.3%.  Analysis of his CGM shows TIR 68%, TAR 26%, TBR 6% (most of which are overnight or before dinner).  He reports he sometimes skips lunch.    Recent labs reviewed.  - Patient remains at a high risk for more acute and chronic complications of diabetes which include CAD, CVA, CKD, retinopathy, and neuropathy. These are all discussed in detail with the patient.  -He will continue to require intensive treatment with basal/bolus insulin in order for him to achieve and maintain control of diabetes to target.    -Based on his frequency of fasting hypoglycemia, he will tolerate reduction of his Lantus to 90 units SQ daily at bedtime.  He is advised to continue Humalog 20-26 units TID with meals if glucose is above 90 and he is eating.  Specific instructions on how to titrate insulin dose based on glucose readings given to patient in writing.  He is advised to continue Januvia 50 mg po daily and Glipizide 5 mg XL daily with breakfast.  -He is encouraged to continue using his CGM to monitor glucose 4 times daily, before meals and before bed, and to call the clinic if he has readings less than 70 or greater than 200 for 3 tests in a row.  -Due to CKD patient is not a candidate for metformin , SGLT2i.  -Target numbers for A1c, LDL, HDL, Triglycerides, Waist Circumference were discussed in detail.  2) Lipids/HPL:  His most recent lipid panel from 7/16/21shows LDL of  136, worsening from 59 in January 2020.  He is benefiting from statin intervention  and is advised to continue Crestor 5 mg p.o. daily at bedtime, omega-3 fatty acid supplement, and fenofibrate 160 mg p.o. daily.  He does report some mild intermittent nocturnal cramping in bilateral lower extremities over the last 6 months. Side effects and precautions discussed with him.  3) Hypothyroidism:   -There are no recent TFTs to review.  He is advised to continue current dose of Levothyroxine at 150 mcg po daily before breakfast.  Will recheck TFTs prior to next visit and adjust dose if necessary. His most recent thyroid function tests show adequate replacement.  He is advised to continue levothyroxine 150 mcg p.o. daily before breakfast.   - We discussed about the correct intake of his thyroid hormone, on empty stomach at fasting, with water, separated by at least 30 minutes from breakfast and other medications,  and separated by more than 4 hours from calcium, iron, multivitamins, acid reflux medications (PPIs). -Patient is made aware of the fact that thyroid hormone replacement is needed for life, dose to be adjusted by periodic monitoring of thyroid function tests.  4) Hypertension/BP- His blood pressure is controlled to target.  He is advised to continue Losartan 50 mg po daily.   I advised patient to maintain close follow up with his PCP for primary care needs.  -Will repeat ABI at next visit due to borderline result in RLE.  In the meantime, he is advised to walk more to promote circulation.  - Time spent on this patient care encounter:  35 min, of which > 50% was spent in  counseling and the rest reviewing his blood glucose logs , discussing his hypoglycemia and hyperglycemia episodes, reviewing his current and  previous labs / studies  ( including abstraction from other facilities) and medications  doses and developing a  long term treatment plan and documenting his care.   Please refer to Patient Instructions for Blood Glucose Monitoring and Insulin/Medications Dosing Guide"   in media tab for additional information. Please  also refer to " Patient Self Inventory" in the Media  tab for reviewed elements of pertinent patient history.  Dierdre Highman participated in the discussions, expressed understanding, and voiced agreement with the above plans.  All questions were answered to his satisfaction. he is encouraged to contact clinic should he have any questions or concerns prior to his return visit.  Follow up plan: Return in about 4 months (around 11/08/2020) for Diabetes follow up with A1c in office, Previsit labs, Bring glucometer and logs, ABI next visit.  Ronny Bacon, [facility] [address] [place], [place] [address] Phone: [phone] Fax: [phone]   07/11/2020, 3:06 PM

## 2020-07-21 ENCOUNTER — Other Ambulatory Visit: Payer: Self-pay | Admitting: "Endocrinology

## 2020-07-21 ENCOUNTER — Other Ambulatory Visit: Payer: Self-pay | Admitting: Nurse Practitioner

## 2020-07-25 DIAGNOSIS — E1122 Type 2 diabetes mellitus with diabetic chronic kidney disease: Secondary | ICD-10-CM | POA: Diagnosis not present

## 2020-07-25 DIAGNOSIS — Z794 Long term (current) use of insulin: Secondary | ICD-10-CM | POA: Diagnosis not present

## 2020-07-27 ENCOUNTER — Other Ambulatory Visit: Payer: Self-pay | Admitting: "Endocrinology

## 2020-07-27 DIAGNOSIS — E119 Type 2 diabetes mellitus without complications: Secondary | ICD-10-CM | POA: Diagnosis not present

## 2020-07-27 DIAGNOSIS — Z0100 Encounter for examination of eyes and vision without abnormal findings: Secondary | ICD-10-CM | POA: Diagnosis not present

## 2020-07-28 DIAGNOSIS — G8929 Other chronic pain: Secondary | ICD-10-CM | POA: Diagnosis not present

## 2020-07-28 DIAGNOSIS — M7061 Trochanteric bursitis, right hip: Secondary | ICD-10-CM | POA: Diagnosis not present

## 2020-07-28 DIAGNOSIS — J449 Chronic obstructive pulmonary disease, unspecified: Secondary | ICD-10-CM | POA: Diagnosis not present

## 2020-08-25 DIAGNOSIS — E1122 Type 2 diabetes mellitus with diabetic chronic kidney disease: Secondary | ICD-10-CM | POA: Diagnosis not present

## 2020-08-25 DIAGNOSIS — Z794 Long term (current) use of insulin: Secondary | ICD-10-CM | POA: Diagnosis not present

## 2020-09-05 DIAGNOSIS — G894 Chronic pain syndrome: Secondary | ICD-10-CM | POA: Diagnosis not present

## 2020-09-05 DIAGNOSIS — E1122 Type 2 diabetes mellitus with diabetic chronic kidney disease: Secondary | ICD-10-CM | POA: Diagnosis not present

## 2020-09-05 DIAGNOSIS — Z0001 Encounter for general adult medical examination with abnormal findings: Secondary | ICD-10-CM | POA: Diagnosis not present

## 2020-09-10 DIAGNOSIS — I10 Essential (primary) hypertension: Secondary | ICD-10-CM | POA: Diagnosis not present

## 2020-09-10 DIAGNOSIS — E1122 Type 2 diabetes mellitus with diabetic chronic kidney disease: Secondary | ICD-10-CM | POA: Diagnosis not present

## 2020-09-10 DIAGNOSIS — J449 Chronic obstructive pulmonary disease, unspecified: Secondary | ICD-10-CM | POA: Diagnosis not present

## 2020-09-10 DIAGNOSIS — E782 Mixed hyperlipidemia: Secondary | ICD-10-CM | POA: Diagnosis not present

## 2020-09-19 ENCOUNTER — Encounter: Payer: Self-pay | Admitting: *Deleted

## 2020-09-25 DIAGNOSIS — E1122 Type 2 diabetes mellitus with diabetic chronic kidney disease: Secondary | ICD-10-CM | POA: Diagnosis not present

## 2020-09-25 DIAGNOSIS — Z794 Long term (current) use of insulin: Secondary | ICD-10-CM | POA: Diagnosis not present

## 2020-10-03 DIAGNOSIS — E7800 Pure hypercholesterolemia, unspecified: Secondary | ICD-10-CM | POA: Diagnosis not present

## 2020-10-03 DIAGNOSIS — E7849 Other hyperlipidemia: Secondary | ICD-10-CM | POA: Diagnosis not present

## 2020-10-04 ENCOUNTER — Encounter: Payer: Self-pay | Admitting: *Deleted

## 2020-10-04 LAB — HEPATIC FUNCTION PANEL
ALT: 29 IU/L (ref 0–44)
AST: 20 IU/L (ref 0–40)
Albumin: 4.7 g/dL (ref 3.8–4.8)
Alkaline Phosphatase: 47 IU/L (ref 44–121)
Bilirubin Total: 0.6 mg/dL (ref 0.0–1.2)
Bilirubin, Direct: 0.13 mg/dL (ref 0.00–0.40)
Total Protein: 7.8 g/dL (ref 6.0–8.5)

## 2020-10-04 LAB — LIPID PANEL
Chol/HDL Ratio: 3.7 ratio (ref 0.0–5.0)
Cholesterol, Total: 151 mg/dL (ref 100–199)
HDL: 41 mg/dL (ref >39)
LDL Chol Calc (NIH): 73 mg/dL (ref 0–99)
Triglycerides: 225 mg/dL — ABNORMAL HIGH (ref 0–149)
VLDL Cholesterol Cal: 37 mg/dL (ref 5–40)

## 2020-10-10 DIAGNOSIS — E1122 Type 2 diabetes mellitus with diabetic chronic kidney disease: Secondary | ICD-10-CM | POA: Diagnosis not present

## 2020-10-10 DIAGNOSIS — E7849 Other hyperlipidemia: Secondary | ICD-10-CM | POA: Diagnosis not present

## 2020-10-10 DIAGNOSIS — J449 Chronic obstructive pulmonary disease, unspecified: Secondary | ICD-10-CM | POA: Diagnosis not present

## 2020-10-10 DIAGNOSIS — I10 Essential (primary) hypertension: Secondary | ICD-10-CM | POA: Diagnosis not present

## 2020-10-24 DIAGNOSIS — G894 Chronic pain syndrome: Secondary | ICD-10-CM | POA: Diagnosis not present

## 2020-10-24 DIAGNOSIS — Z23 Encounter for immunization: Secondary | ICD-10-CM | POA: Diagnosis not present

## 2020-11-02 ENCOUNTER — Other Ambulatory Visit: Payer: Self-pay

## 2020-11-02 ENCOUNTER — Telehealth: Payer: Self-pay | Admitting: "Endocrinology

## 2020-11-02 DIAGNOSIS — E1159 Type 2 diabetes mellitus with other circulatory complications: Secondary | ICD-10-CM

## 2020-11-02 MED ORDER — FREESTYLE LIBRE 14 DAY SENSOR MISC: 1.0000 | 1 refills | Status: DC

## 2020-11-02 NOTE — Telephone Encounter (Signed)
done

## 2020-11-02 NOTE — Telephone Encounter (Signed)
Pt is requesting a refill on the following  Continuous Blood Gluc Sensor (FREESTYLE LIBRE 14 DAY SENSOR) MISC   [facility] - [place], [place] - [id number] Phone:  [phone]  Fax:  [phone]

## 2020-11-03 DIAGNOSIS — Z794 Long term (current) use of insulin: Secondary | ICD-10-CM | POA: Diagnosis not present

## 2020-11-03 DIAGNOSIS — E1122 Type 2 diabetes mellitus with diabetic chronic kidney disease: Secondary | ICD-10-CM | POA: Diagnosis not present

## 2020-11-07 DIAGNOSIS — M1991 Primary osteoarthritis, unspecified site: Secondary | ICD-10-CM | POA: Diagnosis not present

## 2020-11-07 DIAGNOSIS — G894 Chronic pain syndrome: Secondary | ICD-10-CM | POA: Diagnosis not present

## 2020-11-07 DIAGNOSIS — R110 Nausea: Secondary | ICD-10-CM | POA: Diagnosis not present

## 2020-11-07 DIAGNOSIS — E559 Vitamin D deficiency, unspecified: Secondary | ICD-10-CM | POA: Diagnosis not present

## 2020-11-07 DIAGNOSIS — M5136 Other intervertebral disc degeneration, lumbar region: Secondary | ICD-10-CM | POA: Diagnosis not present

## 2020-11-07 DIAGNOSIS — E039 Hypothyroidism, unspecified: Secondary | ICD-10-CM | POA: Diagnosis not present

## 2020-11-08 LAB — TSH: TSH: 11.3 u[IU]/mL — ABNORMAL HIGH (ref 0.450–4.500)

## 2020-11-08 LAB — T4, FREE: Free T4: 1.08 ng/dL (ref 0.82–1.77)

## 2020-11-08 LAB — VITAMIN D 25 HYDROXY (VIT D DEFICIENCY, FRACTURES): Vit D, 25-Hydroxy: 21.8 ng/mL — ABNORMAL LOW (ref 30.0–100.0)

## 2020-11-09 ENCOUNTER — Other Ambulatory Visit: Payer: Self-pay

## 2020-11-09 ENCOUNTER — Ambulatory Visit: Payer: Medicare Other | Admitting: Nurse Practitioner

## 2020-11-09 ENCOUNTER — Encounter: Payer: Self-pay | Admitting: Nurse Practitioner

## 2020-11-09 VITALS — BP 135/66 | HR 90 | Ht 69.0 in | Wt 281.0 lb

## 2020-11-09 DIAGNOSIS — E782 Mixed hyperlipidemia: Secondary | ICD-10-CM | POA: Diagnosis not present

## 2020-11-09 DIAGNOSIS — E1159 Type 2 diabetes mellitus with other circulatory complications: Secondary | ICD-10-CM | POA: Diagnosis not present

## 2020-11-09 DIAGNOSIS — I10 Essential (primary) hypertension: Secondary | ICD-10-CM | POA: Diagnosis not present

## 2020-11-09 DIAGNOSIS — E559 Vitamin D deficiency, unspecified: Secondary | ICD-10-CM | POA: Diagnosis not present

## 2020-11-09 DIAGNOSIS — E1165 Type 2 diabetes mellitus with hyperglycemia: Secondary | ICD-10-CM | POA: Diagnosis not present

## 2020-11-09 DIAGNOSIS — E039 Hypothyroidism, unspecified: Secondary | ICD-10-CM | POA: Diagnosis not present

## 2020-11-09 LAB — POCT GLYCOSYLATED HEMOGLOBIN (HGB A1C): Hemoglobin A1C: 8.6 % — AB (ref 4.0–5.6)

## 2020-11-09 MED ORDER — GLIPIZIDE ER 10 MG PO TB24: 10.0000 mg | ORAL_TABLET | Freq: Every day | ORAL | 3 refills | Status: DC

## 2020-11-09 MED ORDER — LEVOTHYROXINE SODIUM 175 MCG PO TABS: 175.0000 ug | ORAL_TABLET | Freq: Every day | ORAL | 3 refills | Status: DC

## 2020-11-09 NOTE — Progress Notes (Signed)
11/09/2020   Endocrinology follow-up note   Subjective:    Patient ID: [name], male    DOB: [birth year],    Past Medical History:  Diagnosis Date  . ASCVD (arteriosclerotic cardiovascular disease)    -MI in 01/2001 prompted CABG; EF-30% at cath; 50% on echo in 7/02 and normal in 2005  . Asthma   . Cerebrovascular disease     L CEA 06/2004 following left renal embolism; 10/2008 plaque w/o focal stenosis  . Chronic back pain   . Chronic leg pain   . Chronic obstructive pulmonary disease (HCC)   . Degenerative joint disease     chronic LBP-s/p L3-4 fusion  . Diabetes mellitus    -no insulin  . Hyperlipidemia   . Hypertension   . Hypothyroidism   . Obesity   . Restless leg syndrome    Possible  . Tobacco abuse, in remission    50 pack years; discontinued in 2002   Past Surgical History:  Procedure Laterality Date  . CAROTID ENDARTERECTOMY  2005   Left  . CATARACT EXTRACTION     bilateral  . CORONARY ARTERY BYPASS GRAFT  2002  . LUMBAR SPINE SURGERY     L3-4 fusion   Social History   Socioeconomic History  . Marital status: Divorced    Spouse name: Not on file  . Number of children: 1  . Years of education: Not on file  . Highest education level: Not on file  Occupational History  . Occupation: veteran    Comment: disabledf  Tobacco Use  . Smoking status: Former Smoker    Packs/day: 1.00    Years: 50.00    Pack years: 50.00    Types: Cigarettes    Quit date: 01/15/2001    Years since quitting: 19.8  . Smokeless tobacco: Never Used  Vaping Use  . Vaping Use: Never used  Substance and Sexual Activity  . Alcohol use: No  . Drug use: No  . Sexual activity: Not on file  Other Topics Concern  . Not on file  Social History Narrative  . Not on file   Social Determinants of Health   Financial Resource Strain: Not on file  Food Insecurity: Not on file  Transportation Needs: Not on file  Physical Activity: Not on file  Stress: Not on file   Social Connections: Not on file   Outpatient Encounter Medications as of 11/09/2020  Medication Sig  . Acetaminophen (TYLENOL ARTHRITIS EXT RELIEF PO) Take 1-2 tablets by mouth daily as needed (for pain).  Marland Kitchen albuterol (VENTOLIN HFA) 108 (90 Base) MCG/ACT inhaler Inhale into the lungs.  Marland Kitchen albuterol-ipratropium (COMBIVENT) 18-103 MCG/ACT inhaler Inhale 2 puffs into the lungs every 6 (six) hours as needed for wheezing or shortness of breath.   Marland Kitchen aspirin 81 MG tablet Take 81 mg by mouth daily.    . B Complex-Biotin-FA (VITAMIN B50 COMPLEX PO) Take by mouth daily.  . Cholecalciferol (VITAMIN D PO) Take 1 capsule by mouth daily.  . Continuous Blood Gluc Sensor (FREESTYLE LIBRE 14 DAY SENSOR) MISC Inject 1 each into the skin every 14 (fourteen) days. Use as directed.  . docusate sodium (COLACE) 100 MG capsule Take 100 mg by mouth 2 (two) times daily.    Marland Kitchen escitalopram (LEXAPRO) 20 MG tablet Take 20 mg by mouth daily.    . fish oil-omega-3 fatty acids 1000 MG capsule Take 2 g by mouth daily.  Marland Kitchen glipiZIDE (GLUCOTROL XL) 10 MG 24 hr tablet  Take 1 tablet (10 mg total) by mouth daily with breakfast.  . glucose blood (ACCU-CHEK AVIVA PLUS) test strip Use as directed to check blood glucose four times daily  . HYDROcodone-acetaminophen (NORCO) 10-325 MG per tablet Take 2 tablets by mouth.   . insulin glargine (LANTUS SOLOSTAR) 100 UNIT/ML Solostar Pen Inject 90 Units into the skin at bedtime.  . insulin lispro (HUMALOG) 100 UNIT/ML KwikPen Inject 20-26 Units into the skin 3 (three) times daily. If glucose is above 90 and he is eating  . JANUVIA 50 MG tablet TAKE 1 TABLET BY MOUTH DAILY.  Marland Kitchen levothyroxine (SYNTHROID) 175 MCG tablet Take 1 tablet (175 mcg total) by mouth daily before breakfast.  . losartan (COZAAR) 50 MG tablet Take 1 tablet (50 mg total) by mouth daily.  . methocarbamol (ROBAXIN) 500 MG tablet Take by mouth.  . Multiple Vitamins-Minerals (MULTIVITAMIN WITH MINERALS) tablet Take 1 tablet by  mouth daily.    . nitroGLYCERIN (NITROSTAT) 0.4 MG SL tablet Place under the tongue.  . ondansetron (ZOFRAN) 4 MG tablet Take 1 tablet (4 mg total) by mouth every 6 (six) hours as needed for nausea or vomiting.  . rosuvastatin (CRESTOR) 40 MG tablet Take 1 tablet (40 mg total) by mouth daily.  . SURE COMFORT PEN NEEDLES 31G X 8 MM MISC USE AS DIRECTED WITH LANTUS AND NOVOLOG UP TO FOUR TIMES DAILY.  . vitamin B-12 (CYANOCOBALAMIN) 100 MCG tablet Take 100 mcg by mouth daily.  . vitamin C (ASCORBIC ACID) 500 MG tablet Take 500 mg by mouth daily.  . [DISCONTINUED] glipiZIDE (GLUCOTROL XL) 5 MG 24 hr tablet TAKE (1) TABLET BY MOUTH EACH MORNING WITH BREAKFAST.  . [DISCONTINUED] levothyroxine (SYNTHROID) 150 MCG tablet Take 1 tablet (150 mcg total) by mouth daily before breakfast.   No facility-administered encounter medications on file as of 11/09/2020.   ALLERGIES: Allergies  Allergen Reactions  . Alprazolam Nausea And Vomiting and Other (See Comments)    Altered mental status  . Atorvastatin Nausea And Vomiting  . Gemfibrozil     unknown  . Invokana [Canagliflozin] Diarrhea and Nausea And Vomiting  . Definity [Perflutren Lipid Microsphere] Other (See Comments)    Headache   VACCINATION STATUS: Immunization History  Administered Date(s) Administered  . Influenza-Unspecified 05/13/2014    Diabetes He presents for his follow-up diabetic visit. He has type 2 diabetes mellitus. Onset time: He was diagnosed at approximate age of 50 years. His disease course has been worsening. Pertinent negatives for hypoglycemia include no confusion, headaches, nervousness/anxiousness, pallor, sweats or tremors. Associated symptoms include foot paresthesias. Pertinent negatives for diabetes include no fatigue, no polydipsia, no polyphagia and no polyuria. There are no hypoglycemic complications. Symptoms are stable. Diabetic complications include a CVA, heart disease, nephropathy and peripheral neuropathy.  Risk factors for coronary artery disease include diabetes mellitus, dyslipidemia, hypertension, male sex, tobacco exposure, sedentary lifestyle and obesity. Current diabetic treatment includes oral agent (dual therapy) and intensive insulin program. He is compliant with treatment most of the time. His weight is stable. He is following a diabetic and generally healthy diet. Meal planning includes avoidance of concentrated sweets. He has had a previous visit with a dietitian. He participates in exercise intermittently. His home blood glucose trend is decreasing steadily. His breakfast blood glucose range is generally 140-180 mg/dl. His lunch blood glucose range is generally 140-180 mg/dl. His dinner blood glucose range is generally 180-200 mg/dl. His bedtime blood glucose range is generally 140-180 mg/dl. (He presents today with his CGM,  no logs, showing improved, yet still above target fasting and postprandial glycemic profile.  His POCT A1c today is 8.6% increasing from last visit of 8%.  He recently received steroid shots in his back for pain.  Analysis of his CGM shows TIR 53%, TAR 46%, TBR 1%.  He denies any significant hypoglycemia. ) An ACE inhibitor/angiotensin II receptor blocker is being taken. He does not see a podiatrist.Eye exam is current.  Hyperlipidemia This is a chronic problem. The current episode started more than 1 year ago. The problem is uncontrolled. Recent lipid tests were reviewed and are high. Exacerbating diseases include chronic renal disease, diabetes, hypothyroidism and obesity. There are no known factors aggravating his hyperlipidemia. Associated symptoms include myalgias. Current antihyperlipidemic treatment includes statins. The current treatment provides moderate improvement of lipids. There are no compliance problems.  Risk factors for coronary artery disease include family history, dyslipidemia, diabetes mellitus, hypertension, male sex, a sedentary lifestyle and obesity.   Thyroid Problem Presents for follow-up (He has longstanding hypothyroidism, currently on levothyroxine 150 mcg p.o. daily before breakfast.  He reports compliance with medication.) visit. Patient reports no anxiety, cold intolerance, constipation, depressed mood, diarrhea, fatigue, heat intolerance, palpitations or tremors. The symptoms have been stable. His past medical history is significant for diabetes and hyperlipidemia.   Review of systems  Constitutional: + Minimally fluctuating body weight,  current Body mass index is 41.5 kg/m. , no fatigue, no subjective hyperthermia, no subjective hypothermia Eyes: no blurry vision, no xerophthalmia ENT: no sore throat, no nodules palpated in throat, no dysphagia/odynophagia, no hoarseness Cardiovascular: no chest pain, no shortness of breath, no palpitations, no leg swelling Respiratory: no cough, no shortness of breath Gastrointestinal: no nausea/vomiting/diarrhea Musculoskeletal: no muscle/joint aches, complains of intermittent cramping in BLE at night- improving Skin: no rashes, no hyperemia Neurological: no tremors, no numbness, no tingling, no dizziness,  Psychiatric: no depression, no anxiety   Objective:    BP 135/66   Pulse 90   Ht  (1.753 m)   Wt 281 lb (127.5 kg)   BMI 41.50 kg/m   Wt Readings from Last 3 Encounters:  11/09/20 281 lb (127.5 kg)  06/13/20 284 lb (128.8 kg)  05/21/20 274 lb 0.5 oz (124.3 kg)   BP Readings from Last 3 Encounters:  11/09/20 135/66  07/11/20 116/66  06/13/20 (!) 164/88    Physical Exam- Limited  Constitutional:  Body mass index is 41.5 kg/m. , not in acute distress, normal state of mind Eyes:  EOMI, no exophthalmos Neck: Supple Cardiovascular: RRR, no murmers, rubs, or gallops, no edema Respiratory: Adequate breathing efforts, no crackles, rales, rhonchi, or wheezing Musculoskeletal: no gross deformities, strength intact in all four extremities, no gross restriction of joint  movements Skin:  no rashes, no hyperemia Neurological: no tremor with outstretched hands    Results for orders placed or performed in visit on 11/09/20  HgB A1c  Result Value Ref Range   Hemoglobin A1C 8.6 (A) 4.0 - 5.6 %   HbA1c POC (<> result, manual entry)     HbA1c, POC (prediabetic range)     HbA1c, POC (controlled diabetic range)     Lab Results  Component Value Date   WBC 9.4 05/21/2020   HGB 14.6 05/21/2020   HCT 44.6 05/21/2020   MCV 91.4 05/21/2020   PLT 237 05/21/2020    Lab Results  Component Value Date   HGBA1C 8.6 (A) 11/09/2020   HGBA1C 8.0 (A) 07/11/2020   HGBA1C 7.3 (A) 03/08/2020  Lipid Panel     Component Value Date/Time   CHOL 151 10/03/2020 0941   TRIG 225 (H) 10/03/2020 0941   TRIG 329 10/02/2007 0000   HDL 41 10/03/2020 0941   CHOLHDL 3.7 10/03/2020 0941   CHOLHDL 4.9 02/26/2020 1002   VLDL 19 09/09/2018 0949   LDLCALC 73 10/03/2020 0941   LDLCALC 136 (H) 02/26/2020 1002   LDLCALC 66 10/02/2007 0000     Assessment & Plan:   1) Type 2 diabetes mellitus with stage 3 chronic kidney disease, with long-term current use of insulin   His diabetes is complicated by CAD, CKD.CVA, Retinopathy.   He presents today with his CGM, no logs, showing improved, yet still above target fasting and postprandial glycemic profile.  His POCT A1c today is 8.6% increasing from last visit of 8%.  He recently received steroid shots in his back for pain.  Analysis of his CGM shows TIR 53%, TAR 46%, TBR 1%.  He denies any significant hypoglycemia.   Recent labs reviewed.  - Patient remains at a high risk for more acute and chronic complications of diabetes which include CAD, CVA, CKD, retinopathy, and neuropathy. These are all discussed in detail with the patient.  -He will continue to require intensive treatment with basal/bolus insulin in order for him to achieve and maintain control of diabetes to target.    -He will tolerate increase in his Glipizide to 10  mg XL daily with breakfast.  He can continue current doses of Lantus at 90 units SQ nightly and Humalog 20-26 units TID with meals if glucose is above 90 and he is eating.  Specific instructions on how to titrate his insulin dose based on glucose readings given to patient in writing.  He can also continu his Januvia 50 mg po daily.   -He is encouraged to continue using his CGM to monitor glucose 4 times daily, before meals and before bed, and to call the clinic if he has readings less than 70 or greater than 200 for 3 tests in a row.  -Due to CKD patient is not a candidate for Metformin , SGLT2i.  -Target numbers for A1c, LDL, HDL, Triglycerides, Waist Circumference were discussed in detail.  2) Lipids/HPL:  His recent lipid panel from 10/03/20 shows controlled LDL of 73 and elevated triglycerides of 225.  He is advised to continue Crestor 5 mg p.o. daily at bedtime, omega-3 fatty acid supplement, and fenofibrate 160 mg p.o. daily.  Side effects and precautions discussed with him.  3) Hypothyroidism:   -His previsit TFTs are consistent with under-replacement.  He is advised to increase his Levothyroxine to 175 mcg po daily before breakfast.    - We discussed about the correct intake of his thyroid hormone, on empty stomach at fasting, with water, separated by at least 30 minutes from breakfast and other medications,  and separated by more than 4 hours from calcium, iron, multivitamins, acid reflux medications (PPIs). -Patient is made aware of the fact that thyroid hormone replacement is needed for life, dose to be adjusted by periodic monitoring of thyroid function tests.  4) Hypertension/BP- His blood pressure is controlled to target.  He is advised to continue Losartan 50 mg po daily.   I advised patient to maintain close follow up with his PCP for primary care needs.   - Time spent on this patient care encounter:  40 min, of which > 50% was spent in  counseling and the rest reviewing his  blood glucose logs ,  discussing his hypoglycemia and hyperglycemia episodes, reviewing his current and  previous labs / studies  ( including abstraction from other facilities) and medications  doses and developing a  long term treatment plan and documenting his care.   Please refer to Patient Instructions for Blood Glucose Monitoring and Insulin/Medications Dosing Guide"  in media tab for additional information. Please  also refer to " Patient Self Inventory" in the Media  tab for reviewed elements of pertinent patient history.  Dierdre Highman participated in the discussions, expressed understanding, and voiced agreement with the above plans.  All questions were answered to his satisfaction. he is encouraged to contact clinic should he have any questions or concerns prior to his return visit.    Follow up plan: Return in about 4 months (around 03/11/2021) for Diabetes follow up with A1c in office, Thyroid follow up, Previsit labs, Bring glucometer and logs.  Ronny Bacon, [facility] [address] [place], [place] [address] Phone: [phone] Fax: [phone]   11/09/2020, 11:35 AM

## 2020-11-09 NOTE — Patient Instructions (Signed)
Advice for Weight Management  -For most of us the best way to lose weight is by diet management. Generally speaking, diet management means consuming less calories intentionally which over time brings about progressive weight loss.  This can be achieved more effectively by restricting carbohydrate consumption to the minimum possible.  So, it is critically important to know your numbers: how much calorie you are consuming and how much calorie you need. More importantly, our carbohydrates sources should be unprocessed or minimally processed complex starch food items.   Sometimes, it is important to balance nutrition by increasing protein intake (animal or plant source), fruits, and vegetables.  -Sticking to a routine mealtime to eat 3 meals a day and avoiding unnecessary snacks is shown to have a big role in weight control. Under normal circumstances, the only time we lose real weight is when we are hungry, so allow hunger to take place- hunger means no food between meal times, only water.  It is not advisable to starve.   -It is better to avoid simple carbohydrates including: Cakes, Sweet Desserts, Ice Cream, Soda (diet and regular), Sweet Tea, Candies, Chips, Cookies, Store Bought Juices, Alcohol in Excess of  1-2 drinks a day, Artificial Sweeteners, Doughnuts, Coffee Creamers, "Sugar-free" Products, etc, etc.  This is not a complete list.....    -Consulting with certified diabetes educators is proven to provide you with the most accurate and current information on diet.  Also, you may be  interested in discussing diet options/exchanges , we can schedule a visit with Penny Crumpton, RDN, CDE for individualized nutrition education.  -Exercise: If you are able: 30 -60 minutes a day ,4 days a week, or 150 minutes a week.  The longer the better.  Combine stretch, strength, and aerobic activities.  If you were told in the past that you have high risk for cardiovascular diseases, you may seek evaluation by  your heart doctor prior to initiating moderate to intense exercise programs.      - The correct intake of thyroid hormone (Levothyroxine, Synthroid), is on empty stomach first thing in the morning, with water, separated by at least 30 minutes from breakfast and other medications,  and separated by more than 4 hours from calcium, iron, multivitamins, acid reflux medications (PPIs).  - This medication is a life-long medication and will be needed to correct thyroid hormone imbalances for the rest of your life.  The dose may change from time to time, based on thyroid blood work.  - It is extremely important to be consistent taking this medication, near the same time each morning.  -AVOID TAKING PRODUCTS CONTAINING BIOTIN (commonly found in Hair, Skin, Nails vitamins) AS IT INTERFERES WITH THE VALIDITY OF THYROID FUNCTION BLOOD TESTS.  

## 2020-11-19 ENCOUNTER — Other Ambulatory Visit: Payer: Self-pay | Admitting: Nurse Practitioner

## 2020-11-30 DIAGNOSIS — M5136 Other intervertebral disc degeneration, lumbar region: Secondary | ICD-10-CM | POA: Diagnosis not present

## 2020-11-30 DIAGNOSIS — M5126 Other intervertebral disc displacement, lumbar region: Secondary | ICD-10-CM | POA: Diagnosis not present

## 2020-12-04 DIAGNOSIS — Z794 Long term (current) use of insulin: Secondary | ICD-10-CM | POA: Diagnosis not present

## 2020-12-04 DIAGNOSIS — E1122 Type 2 diabetes mellitus with diabetic chronic kidney disease: Secondary | ICD-10-CM | POA: Diagnosis not present

## 2020-12-10 DIAGNOSIS — E1122 Type 2 diabetes mellitus with diabetic chronic kidney disease: Secondary | ICD-10-CM | POA: Diagnosis not present

## 2020-12-10 DIAGNOSIS — I10 Essential (primary) hypertension: Secondary | ICD-10-CM | POA: Diagnosis not present

## 2020-12-10 DIAGNOSIS — J449 Chronic obstructive pulmonary disease, unspecified: Secondary | ICD-10-CM | POA: Diagnosis not present

## 2020-12-10 DIAGNOSIS — E7849 Other hyperlipidemia: Secondary | ICD-10-CM | POA: Diagnosis not present

## 2020-12-11 DIAGNOSIS — S72002A Fracture of unspecified part of neck of left femur, initial encounter for closed fracture: Secondary | ICD-10-CM | POA: Diagnosis not present

## 2020-12-11 DIAGNOSIS — Z720 Tobacco use: Secondary | ICD-10-CM | POA: Diagnosis not present

## 2020-12-11 DIAGNOSIS — M25552 Pain in left hip: Secondary | ICD-10-CM | POA: Diagnosis not present

## 2020-12-12 DIAGNOSIS — S72002A Fracture of unspecified part of neck of left femur, initial encounter for closed fracture: Secondary | ICD-10-CM | POA: Diagnosis not present

## 2020-12-12 DIAGNOSIS — Z720 Tobacco use: Secondary | ICD-10-CM | POA: Diagnosis not present

## 2020-12-13 DIAGNOSIS — S72002A Fracture of unspecified part of neck of left femur, initial encounter for closed fracture: Secondary | ICD-10-CM | POA: Diagnosis not present

## 2020-12-13 DIAGNOSIS — Z720 Tobacco use: Secondary | ICD-10-CM | POA: Diagnosis not present

## 2020-12-14 DIAGNOSIS — Z720 Tobacco use: Secondary | ICD-10-CM | POA: Diagnosis not present

## 2020-12-14 DIAGNOSIS — S72002A Fracture of unspecified part of neck of left femur, initial encounter for closed fracture: Secondary | ICD-10-CM | POA: Diagnosis not present

## 2020-12-20 DIAGNOSIS — E7849 Other hyperlipidemia: Secondary | ICD-10-CM | POA: Diagnosis not present

## 2020-12-20 DIAGNOSIS — Z0001 Encounter for general adult medical examination with abnormal findings: Secondary | ICD-10-CM | POA: Diagnosis not present

## 2020-12-20 DIAGNOSIS — M5136 Other intervertebral disc degeneration, lumbar region: Secondary | ICD-10-CM | POA: Diagnosis not present

## 2020-12-20 DIAGNOSIS — E782 Mixed hyperlipidemia: Secondary | ICD-10-CM | POA: Diagnosis not present

## 2020-12-20 DIAGNOSIS — Z1389 Encounter for screening for other disorder: Secondary | ICD-10-CM | POA: Diagnosis not present

## 2020-12-20 DIAGNOSIS — Z0000 Encounter for general adult medical examination without abnormal findings: Secondary | ICD-10-CM | POA: Diagnosis not present

## 2020-12-20 DIAGNOSIS — G894 Chronic pain syndrome: Secondary | ICD-10-CM | POA: Diagnosis not present

## 2020-12-22 DIAGNOSIS — Z135 Encounter for screening for eye and ear disorders: Secondary | ICD-10-CM | POA: Diagnosis not present

## 2020-12-30 ENCOUNTER — Other Ambulatory Visit: Payer: Self-pay | Admitting: Nurse Practitioner

## 2021-01-04 DIAGNOSIS — E1122 Type 2 diabetes mellitus with diabetic chronic kidney disease: Secondary | ICD-10-CM | POA: Diagnosis not present

## 2021-01-04 DIAGNOSIS — Z794 Long term (current) use of insulin: Secondary | ICD-10-CM | POA: Diagnosis not present

## 2021-01-23 DIAGNOSIS — M5136 Other intervertebral disc degeneration, lumbar region: Secondary | ICD-10-CM | POA: Diagnosis not present

## 2021-01-23 DIAGNOSIS — M1991 Primary osteoarthritis, unspecified site: Secondary | ICD-10-CM | POA: Diagnosis not present

## 2021-01-23 DIAGNOSIS — G894 Chronic pain syndrome: Secondary | ICD-10-CM | POA: Diagnosis not present

## 2021-01-31 ENCOUNTER — Encounter (HOSPITAL_COMMUNITY): Payer: Self-pay | Admitting: *Deleted

## 2021-01-31 ENCOUNTER — Other Ambulatory Visit: Payer: Self-pay

## 2021-01-31 ENCOUNTER — Emergency Department (HOSPITAL_COMMUNITY)
Admission: EM | Admit: 2021-01-31 | Discharge: 2021-01-31 | Disposition: A | Discharge status: Home / Self Care | Payer: Medicare Other | Attending: Emergency Medicine | Admitting: Emergency Medicine

## 2021-01-31 ENCOUNTER — Emergency Department (HOSPITAL_COMMUNITY): Payer: Medicare Other

## 2021-01-31 DIAGNOSIS — R197 Diarrhea, unspecified: Secondary | ICD-10-CM | POA: Insufficient documentation

## 2021-01-31 DIAGNOSIS — K8020 Calculus of gallbladder without cholecystitis without obstruction: Secondary | ICD-10-CM | POA: Diagnosis not present

## 2021-01-31 DIAGNOSIS — E1159 Type 2 diabetes mellitus with other circulatory complications: Secondary | ICD-10-CM | POA: Insufficient documentation

## 2021-01-31 DIAGNOSIS — Z7951 Long term (current) use of inhaled steroids: Secondary | ICD-10-CM | POA: Insufficient documentation

## 2021-01-31 DIAGNOSIS — E039 Hypothyroidism, unspecified: Secondary | ICD-10-CM | POA: Insufficient documentation

## 2021-01-31 DIAGNOSIS — J449 Chronic obstructive pulmonary disease, unspecified: Secondary | ICD-10-CM | POA: Diagnosis not present

## 2021-01-31 DIAGNOSIS — Z794 Long term (current) use of insulin: Secondary | ICD-10-CM | POA: Diagnosis not present

## 2021-01-31 DIAGNOSIS — R109 Unspecified abdominal pain: Secondary | ICD-10-CM | POA: Diagnosis not present

## 2021-01-31 DIAGNOSIS — R1011 Right upper quadrant pain: Secondary | ICD-10-CM | POA: Diagnosis not present

## 2021-01-31 DIAGNOSIS — E1122 Type 2 diabetes mellitus with diabetic chronic kidney disease: Secondary | ICD-10-CM | POA: Insufficient documentation

## 2021-01-31 DIAGNOSIS — Z87891 Personal history of nicotine dependence: Secondary | ICD-10-CM | POA: Insufficient documentation

## 2021-01-31 DIAGNOSIS — J45909 Unspecified asthma, uncomplicated: Secondary | ICD-10-CM | POA: Insufficient documentation

## 2021-01-31 DIAGNOSIS — Z79899 Other long term (current) drug therapy: Secondary | ICD-10-CM | POA: Diagnosis not present

## 2021-01-31 DIAGNOSIS — K760 Fatty (change of) liver, not elsewhere classified: Secondary | ICD-10-CM | POA: Diagnosis not present

## 2021-01-31 DIAGNOSIS — N183 Chronic kidney disease, stage 3 unspecified: Secondary | ICD-10-CM | POA: Diagnosis not present

## 2021-01-31 DIAGNOSIS — Z7982 Long term (current) use of aspirin: Secondary | ICD-10-CM | POA: Diagnosis not present

## 2021-01-31 DIAGNOSIS — Z951 Presence of aortocoronary bypass graft: Secondary | ICD-10-CM | POA: Insufficient documentation

## 2021-01-31 DIAGNOSIS — Z7984 Long term (current) use of oral hypoglycemic drugs: Secondary | ICD-10-CM | POA: Insufficient documentation

## 2021-01-31 DIAGNOSIS — I129 Hypertensive chronic kidney disease with stage 1 through stage 4 chronic kidney disease, or unspecified chronic kidney disease: Secondary | ICD-10-CM | POA: Insufficient documentation

## 2021-01-31 LAB — COMPREHENSIVE METABOLIC PANEL
ALT: 49 U/L — ABNORMAL HIGH (ref 0–44)
AST: 30 U/L (ref 15–41)
Albumin: 4.3 g/dL (ref 3.5–5.0)
Alkaline Phosphatase: 43 U/L (ref 38–126)
Anion gap: 12 (ref 5–15)
BUN: 18 mg/dL (ref 8–23)
CO2: 24 mmol/L (ref 22–32)
Calcium: 9.7 mg/dL (ref 8.9–10.3)
Chloride: 100 mmol/L (ref 98–111)
Creatinine, Ser: 1.08 mg/dL (ref 0.61–1.24)
GFR, Estimated: >60 mL/min (ref >60)
Glucose, Bld: 168 mg/dL — ABNORMAL HIGH (ref 70–99)
Potassium: 4.1 mmol/L (ref 3.5–5.1)
Sodium: 136 mmol/L (ref 135–145)
Total Bilirubin: 0.9 mg/dL (ref 0.3–1.2)
Total Protein: 7.7 g/dL (ref 6.5–8.1)

## 2021-01-31 LAB — CBC WITH DIFFERENTIAL/PLATELET
Abs Immature Granulocytes: 0.05 10*3/uL (ref 0.00–0.07)
Basophils Absolute: 0 10*3/uL (ref 0.0–0.1)
Basophils Relative: 0 %
Eosinophils Absolute: 0.1 10*3/uL (ref 0.0–0.5)
Eosinophils Relative: 1 %
HCT: 43.5 % (ref 39.0–52.0)
Hemoglobin: 14.1 g/dL (ref 13.0–17.0)
Immature Granulocytes: 0 %
Lymphocytes Relative: 18 %
Lymphs Abs: 2.1 10*3/uL (ref 0.7–4.0)
MCH: 29.7 pg (ref 26.0–34.0)
MCHC: 32.4 g/dL (ref 30.0–36.0)
MCV: 91.6 fL (ref 80.0–100.0)
Monocytes Absolute: 1 10*3/uL (ref 0.1–1.0)
Monocytes Relative: 9 %
Neutro Abs: 8.6 10*3/uL — ABNORMAL HIGH (ref 1.7–7.7)
Neutrophils Relative %: 72 %
Platelets: 227 10*3/uL (ref 150–400)
RBC: 4.75 MIL/uL (ref 4.22–5.81)
RDW: 13 % (ref 11.5–15.5)
WBC: 11.9 10*3/uL — ABNORMAL HIGH (ref 4.0–10.5)
nRBC: 0 % (ref 0.0–0.2)

## 2021-01-31 LAB — LIPASE, BLOOD: Lipase: 27 U/L (ref 11–51)

## 2021-01-31 MED ORDER — PANTOPRAZOLE SODIUM 40 MG PO TBEC: 40.0000 mg | DELAYED_RELEASE_TABLET | Freq: Every day | ORAL | 0 refills | Status: DC

## 2021-01-31 MED ORDER — OXYCODONE-ACETAMINOPHEN 5-325 MG PO TABS: 1.0000 | ORAL_TABLET | ORAL | 0 refills | Status: DC | PRN

## 2021-01-31 NOTE — ED Provider Notes (Signed)
Emergency Medicine Provider Triage Evaluation Note  [name] , a 65 y.o. male  was evaluated in triage.  Pt complains of 3 to 4-week history of abdominal pain which is very localized to the left mid to lower abdomen in association with nonbloody diarrhea.  He denies fevers or chills, no nausea or vomiting.  No recent antibiotic use.  Also denies chest pain or shortness of breath.  Review of Systems  Positive: Abdominal pain and diarrhea Negative: Fever, nausea and vomiting  Physical Exam  BP (!) 157/82 (BP Location: Right Arm)   Pulse (!) 112   Temp 97.6 F (36.4 C) (Oral)   Resp 16   Ht 5\' 9"  (1.753 m)   Wt 122.5 kg   SpO2 96%   BMI 39.87 kg/m  Gen:   Awake, no distress   Resp:  Normal effort  MSK:   Moves extremities without difficulty  Other:  Tender to palpation in mid to lower right abdomen without guarding or rebound.  Bowel sounds are normal  Medical Decision Making  Medically screening exam initiated at 2:15 PM.  Appropriate orders placed.  [name] was informed that the remainder of the evaluation will be completed by another provider, this initial triage assessment does not replace that evaluation, and the importance of remaining in the ED until their evaluation is complete.  Patient with chronic abdominal pain with diarrhea of unclear etiology.  Basic labs have been ordered, further management pending room assignment and lab results.   Dierdre Highman, PA-C 01/31/21 1418    02/02/21, MD 01/31/21 1427

## 2021-01-31 NOTE — Discharge Instructions (Addendum)
The ultrasound of your abdomen today shows that you have several gallstones.  As discussed, avoid greasy spicy foods and alcohol.  Call the surgeon listed on your discharge papers to arrange a follow-up appointment.  Return to the emergency department for any new or worsening symptoms such as increased pain, fever or persistent vomiting.

## 2021-01-31 NOTE — ED Triage Notes (Signed)
Pt with abd pain x 3-4 weeks with loose stools, + nausea, and emesis couple of times.

## 2021-01-31 NOTE — ED Provider Notes (Signed)
[facility] EMERGENCY DEPARTMENT Provider Note   CSN: [id number] Arrival date & time: 01/31/21  1331     History Chief Complaint  Patient presents with   Abdominal Pain    [name] is a 65 y.o. male.   Abdominal Pain Associated symptoms: diarrhea, nausea and vomiting   Associated symptoms: no chest pain, no chills, no dysuria, no fatigue, no fever, no hematuria and no shortness of breath        [name] is a 65 y.o. male with past medical history of asthma, atherosclerotic cardiovascular disease, hypertension, type 2 diabetes, and COPD who presents to the Emergency Department complaining of 3 to 4-week history of right upper quadrant pain.  He describes the pain as dull and nonradiating.  Pain is been associated with several episodes of loose brown stools.  Nonbloody or black.  Nausea and occasional episodes of vomiting.  Pain does not seem to worsen with food intake.  He denies chest pain, back pain, dysuria, and shortness of breath.  No fever or chills.  Denies alcohol use.    Past Medical History:  Diagnosis Date   ASCVD (arteriosclerotic cardiovascular disease)    -MI in 01/2001 prompted CABG; EF-30% at cath; 50% on echo in 7/02 and normal in 2005   Asthma    Cerebrovascular disease     L CEA 06/2004 following left renal embolism; 10/2008 plaque w/o focal stenosis   Chronic back pain    Chronic leg pain    Chronic obstructive pulmonary disease (HCC)    Degenerative joint disease     chronic LBP-s/p L3-4 fusion   Diabetes mellitus    -no insulin   Hyperlipidemia    Hypertension    Hypothyroidism    Obesity    Restless leg syndrome    Possible   Tobacco abuse, in remission    50 pack years; discontinued in 2002    Patient Active Problem List   Diagnosis Date Noted   Type 2 diabetes mellitus with vascular disease (HCC) 06/03/2015   ASCVD (arteriosclerotic cardiovascular disease)    Cerebrovascular disease    Mixed hyperlipidemia    Tobacco  abuse, in remission    Hypothyroidism    Class 3 obesity due to excess calories with serious comorbidity and body mass index (BMI) of 40.0 to 44.9 in adult    Degenerative joint disease    Essential hypertension    Chronic obstructive pulmonary disease (HCC) 12/18/2009   Type 2 diabetes mellitus with stage 3 chronic kidney disease (HCC) 02/11/2009    Past Surgical History:  Procedure Laterality Date   CAROTID ENDARTERECTOMY  2005   Left   CATARACT EXTRACTION     bilateral   CORONARY ARTERY BYPASS GRAFT  2002   LUMBAR SPINE SURGERY     L3-4 fusion       History reviewed. No pertinent family history.  Social History   Tobacco Use   Smoking status: Former    Packs/day: 1.00    Years: 50.00    Pack years: 50.00    Types: Cigarettes    Quit date: 01/15/2001    Years since quitting: 20.0   Smokeless tobacco: Never  Vaping Use   Vaping Use: Never used  Substance Use Topics   Alcohol use: No   Drug use: No    Home Medications Prior to Admission medications   Medication Sig Start Date End Date Taking? Authorizing Provider  Acetaminophen (TYLENOL ARTHRITIS EXT RELIEF PO) Take 1-2 tablets by mouth  daily as needed (for pain).    [provider]  albuterol (VENTOLIN HFA) 108 (90 Base) MCG/ACT inhaler Inhale into the lungs. 03/05/20   [provider]  albuterol-ipratropium (COMBIVENT) 18-103 MCG/ACT inhaler Inhale 2 puffs into the lungs every 6 (six) hours as needed for wheezing or shortness of breath.     [provider]  aspirin 81 MG tablet Take 81 mg by mouth daily.      [provider]  B Complex-Biotin-FA (VITAMIN B50 COMPLEX PO) Take by mouth daily.    [provider]  Cholecalciferol (VITAMIN D PO) Take 1 capsule by mouth daily.    [provider]  Continuous Blood Gluc Sensor (FREESTYLE LIBRE 14 DAY SENSOR) MISC Inject 1 each into the skin every 14 (fourteen) days. Use as directed. 11/02/20   Dani Gobbleeardon, Whitney J, NP   docusate sodium (COLACE) 100 MG capsule Take 100 mg by mouth 2 (two) times daily.      [provider]  escitalopram (LEXAPRO) 20 MG tablet Take 20 mg by mouth daily.      [provider]  fish oil-omega-3 fatty acids 1000 MG capsule Take 2 g by mouth daily.    [provider]  glipiZIDE (GLUCOTROL XL) 10 MG 24 hr tablet Take 1 tablet (10 mg total) by mouth daily with breakfast. 11/09/20   Dani Gobbleeardon, Whitney J, NP  glucose blood (ACCU-CHEK AVIVA PLUS) test strip Use as directed to check blood glucose four times daily 03/07/20   Roma KayserNida, Gebreselassie W, MD  HYDROcodone-acetaminophen (NORCO) 10-325 MG per tablet Take 2 tablets by mouth.     [provider]  insulin glargine (LANTUS SOLOSTAR) 100 UNIT/ML Solostar Pen Inject 90 Units into the skin at bedtime. 07/11/20   Dani Gobbleeardon, Whitney J, NP  insulin lispro (HUMALOG) 100 UNIT/ML KwikPen INJECT 20 TO 26 UNITS INTO THE SKIN 3 TIMES DAILY BEFORE MEALS. 11/21/20   Reardon, Freddi StarrWhitney J, NP  JANUVIA 50 MG tablet TAKE 1 TABLET BY MOUTH DAILY. 12/30/20   Roma KayserNida, Gebreselassie W, MD  levothyroxine (SYNTHROID) 175 MCG tablet Take 1 tablet (175 mcg total) by mouth daily before breakfast. 11/09/20   Dani Gobbleeardon, Whitney J, NP  losartan (COZAAR) 50 MG tablet Take 1 tablet (50 mg total) by mouth daily. 06/13/20 09/11/20  Lewayne Buntingrenshaw, Brian S, MD  methocarbamol (ROBAXIN) 500 MG tablet Take by mouth. 06/30/20   [provider]  Multiple Vitamins-Minerals (MULTIVITAMIN WITH MINERALS) tablet Take 1 tablet by mouth daily.      [provider]  nitroGLYCERIN (NITROSTAT) 0.4 MG SL tablet Place under the tongue. 05/05/20   [provider]  ondansetron (ZOFRAN) 4 MG tablet Take 1 tablet (4 mg total) by mouth every 6 (six) hours as needed for nausea or vomiting. 10/29/19   Devoria AlbeKnapp, Iva, MD  rosuvastatin (CRESTOR) 40 MG tablet Take 1 tablet (40 mg total) by mouth daily. 06/13/20   Lewayne Buntingrenshaw, Brian S, MD  SURE COMFORT PEN NEEDLES 31G X 8 MM  MISC USE AS DIRECTED WITH LANTUS AND NOVOLOG UP TO FOUR TIMES DAILY. 07/21/20   Roma KayserNida, Gebreselassie W, MD  vitamin B-12 (CYANOCOBALAMIN) 100 MCG tablet Take 100 mcg by mouth daily.    [provider]  vitamin C (ASCORBIC ACID) 500 MG tablet Take 500 mg by mouth daily.    [provider]    Allergies    Alprazolam, Atorvastatin, Gemfibrozil, Invokana [canagliflozin], and Definity [perflutren lipid microsphere]  Review of Systems   Review of Systems  Constitutional:  Negative  for appetite change, chills, fatigue and fever.  HENT:  Negative for trouble swallowing.   Respiratory:  Negative for shortness of breath.   Cardiovascular:  Negative for chest pain and palpitations.  Gastrointestinal:  Positive for abdominal pain, diarrhea, nausea and vomiting.  Genitourinary:  Negative for dysuria, flank pain and hematuria.  Musculoskeletal:  Negative for arthralgias, back pain and myalgias.  Skin:  Negative for rash.  Neurological:  Negative for dizziness, weakness, numbness and headaches.  Hematological:  Does not bruise/bleed easily.   Physical Exam Updated Vital Signs BP (!) 157/82 (BP Location: Right Arm)   Pulse (!) 112   Temp 97.6 F (36.4 C) (Oral)   Resp 16   Ht 5\' 9"  (1.753 m)   Wt 122.5 kg   SpO2 96%   BMI 39.87 kg/m   Physical Exam Vitals and nursing note reviewed.  Constitutional:      General: He is not in acute distress.    Appearance: He is well-developed. He is obese. He is not ill-appearing.  HENT:     Mouth/Throat:     Mouth: Mucous membranes are moist.  Cardiovascular:     Rate and Rhythm: Normal rate and regular rhythm.     Pulses: Normal pulses.  Pulmonary:     Effort: Pulmonary effort is normal. No respiratory distress.  Abdominal:     General: There is no distension.     Palpations: Abdomen is soft.     Tenderness: There is abdominal tenderness. There is no right CVA tenderness, left CVA tenderness or guarding.     Comments: Focal ttp of  the RUQ.  No guarding or rebound tenderness.    Musculoskeletal:        General: Normal range of motion.  Skin:    General: Skin is warm.     Capillary Refill: Capillary refill takes less than 2 seconds.     Findings: No rash.  Neurological:     Mental Status: He is alert.    ED Results / Procedures / Treatments   Labs (all labs ordered are listed, but only abnormal results are displayed) Labs Reviewed  COMPREHENSIVE METABOLIC PANEL - Abnormal; Notable for the following components:      Result Value   Glucose, Bld 168 (*)    ALT 49 (*)    All other components within normal limits  CBC WITH DIFFERENTIAL/PLATELET - Abnormal; Notable for the following components:   WBC 11.9 (*)    Neutro Abs 8.6 (*)    All other components within normal limits  LIPASE, BLOOD  URINALYSIS, ROUTINE W REFLEX MICROSCOPIC    EKG None  Radiology Abdomen Limited  Result Date: 01/31/2021 CLINICAL DATA:  65 year old male with right upper quadrant abdominal pain. EXAM: ULTRASOUND ABDOMEN LIMITED RIGHT UPPER QUADRANT COMPARISON:  None. FINDINGS: Gallbladder: There are several stones within gallbladder. No gallbladder wall thickening or pericholecystic fluid. Negative sonographic Murphy's sign. Common bile duct: Diameter: 6 mm Liver: There is diffuse increased liver echogenicity most commonly seen in the setting of fatty infiltration. Superimposed inflammation or fibrosis is not excluded. Clinical correlation is recommended. Portal vein is patent on color Doppler imaging with normal direction of blood flow towards the liver. Other: None. IMPRESSION: 1. Cholelithiasis without sonographic evidence of acute cholecystitis. 2. Fatty liver. Electronically Signed   By: 77 M.D.   On: 01/31/2021 16:53    Procedures Procedures   Medications Ordered in ED Medications - No data to display  ED Course  I have reviewed  the triage vital signs and the nursing notes.  Pertinent labs & imaging results that  were available during my care of the patient were reviewed by me and considered in my medical decision making (see chart for details).    MDM Rules/Calculators/A&P                          Patient here with 3 to 4-week history of right upper quadrant pain.  Pain has been focal and associated with nausea and several episodes of emesis and loose stools.  No fever or chills.  No increased pain with food intake.  On exam, patient well-appearing nontoxic.  Vital signs reassuring.  He does have focal tenderness to palpation of the right upper quadrant.  No guarding.  Labs and ultrasound of the abdomen obtained.  Labs interpreted by me, show mild leukocytosis, lipase unremarkable, electrolytes show blood glucose of 168.  Total bili unremarkable ALT slightly elevated at 49.  AST unremarkable.  Ultrasound of the abdomen shows several stones within the gallbladder without pericholecystic fluid or wall thickening.  Common bile duct is 6 mm.  I feel that patient is appropriate for discharge home.  Discussed findings with patient and I have recommended avoiding greasy, spicy food and alcohol.  He is agreeable to close follow-up with general surgery.  Will prescribe short course of pain medication and PPI.  Patient given return precautions.   Final Clinical Impression(s) / ED Diagnoses Final diagnoses:  Right upper quadrant abdominal pain  Calculus of gallbladder without cholecystitis without obstruction    Rx / DC Orders ED Discharge Orders     None        Pauline Aus, PA-C 01/31/21 1744    Blane Ohara, MD 02/01/21 0010

## 2021-02-02 ENCOUNTER — Other Ambulatory Visit: Payer: Self-pay

## 2021-02-02 ENCOUNTER — Ambulatory Visit: Payer: Medicare Other | Admitting: General Surgery

## 2021-02-02 ENCOUNTER — Encounter: Payer: Self-pay | Admitting: General Surgery

## 2021-02-02 VITALS — BP 133/80 | HR 95 | Temp 98.4°F | Resp 16 | Ht 69.0 in | Wt 266.0 lb

## 2021-02-02 DIAGNOSIS — K8020 Calculus of gallbladder without cholecystitis without obstruction: Secondary | ICD-10-CM | POA: Diagnosis not present

## 2021-02-02 NOTE — Progress Notes (Signed)
[name]; [id number]; Dec 29, 1955   HPI Patient is a 65 year old white male who was referred to my care by Dr. Phillips Odor for evaluation and treatment of biliary colic secondary to cholelithiasis.  Patient was recently seen in the emergency room for a 3-week history of right upper quadrant abdominal pain with nausea.  Ultrasound the gallbladder revealed cholelithiasis with a normal common bile duct.  Since he has gone home, he continues to have right upper quadrant abdominal pain which radiates around his right flank to his back.  Does have nausea.  His appetite is decreased.  No fever, chills, or jaundice have been noted. Past Medical History:  Diagnosis Date   ASCVD (arteriosclerotic cardiovascular disease)    -MI in 01/2001 prompted CABG; EF-30% at cath; 50% on echo in 7/02 and normal in 2005   Asthma    Cerebrovascular disease     L CEA 06/2004 following left renal embolism; 10/2008 plaque w/o focal stenosis   Chronic back pain    Chronic leg pain    Chronic obstructive pulmonary disease (HCC)    Degenerative joint disease     chronic LBP-s/p L3-4 fusion   Diabetes mellitus    -no insulin   Hyperlipidemia    Hypertension    Hypothyroidism    Obesity    Restless leg syndrome    Possible   Tobacco abuse, in remission    50 pack years; discontinued in 2002    Past Surgical History:  Procedure Laterality Date   CAROTID ENDARTERECTOMY  2005   Left   CATARACT EXTRACTION     bilateral   CORONARY ARTERY BYPASS GRAFT  2002   LUMBAR SPINE SURGERY     L3-4 fusion    History reviewed. No pertinent family history.  Current Outpatient Medications on File Prior to Visit  Medication Sig Dispense Refill   albuterol (VENTOLIN HFA) 108 (90 Base) MCG/ACT inhaler Inhale into the lungs.     albuterol-ipratropium (COMBIVENT) 18-103 MCG/ACT inhaler Inhale 2 puffs into the lungs every 6 (six) hours as needed for wheezing or shortness of breath.      aspirin 81 MG tablet Take 81 mg by mouth  daily.       B Complex-Biotin-FA (VITAMIN B50 COMPLEX PO) Take by mouth daily.     Cholecalciferol (VITAMIN D PO) Take 1 capsule by mouth daily.     Continuous Blood Gluc Sensor (FREESTYLE LIBRE 14 DAY SENSOR) MISC Inject 1 each into the skin every 14 (fourteen) days. Use as directed. 6 each 1   cyclobenzaprine (FLEXERIL) 10 MG tablet Take 10 mg by mouth 3 (three) times daily as needed for muscle spasms.     docusate sodium (COLACE) 100 MG capsule Take 100 mg by mouth 2 (two) times daily.       escitalopram (LEXAPRO) 20 MG tablet Take 20 mg by mouth daily.       fish oil-omega-3 fatty acids 1000 MG capsule Take 2 g by mouth daily.     glipiZIDE (GLUCOTROL XL) 10 MG 24 hr tablet Take 1 tablet (10 mg total) by mouth daily with breakfast. 90 tablet 3   glucose blood (ACCU-CHEK AVIVA PLUS) test strip Use as directed to check blood glucose four times daily 400 strip 1   insulin glargine (LANTUS SOLOSTAR) 100 UNIT/ML Solostar Pen Inject 90 Units into the skin at bedtime. 90 mL 2   insulin lispro (HUMALOG) 100 UNIT/ML KwikPen INJECT 20 TO 26 UNITS INTO THE SKIN 3 TIMES DAILY BEFORE MEALS. 30  mL 3   JANUVIA 50 MG tablet TAKE 1 TABLET BY MOUTH DAILY. 90 tablet 0   levothyroxine (SYNTHROID) 175 MCG tablet Take 1 tablet (175 mcg total) by mouth daily before breakfast. 90 tablet 3   losartan (COZAAR) 50 MG tablet Take 1 tablet (50 mg total) by mouth daily. 90 tablet 3   Multiple Vitamins-Minerals (MULTIVITAMIN WITH MINERALS) tablet Take 1 tablet by mouth daily.       nitroGLYCERIN (NITROSTAT) 0.4 MG SL tablet Place under the tongue.     ondansetron (ZOFRAN) 4 MG tablet Take 1 tablet (4 mg total) by mouth every 6 (six) hours as needed for nausea or vomiting. 12 tablet 0   pantoprazole (PROTONIX) 40 MG tablet Take 1 tablet (40 mg total) by mouth daily. 20 tablet 0   rosuvastatin (CRESTOR) 40 MG tablet Take 1 tablet (40 mg total) by mouth daily. 180 tablet 3   SURE COMFORT PEN NEEDLES 31G X 8 MM MISC USE AS  DIRECTED WITH LANTUS AND NOVOLOG UP TO FOUR TIMES DAILY. 100 each 2   vitamin B-12 (CYANOCOBALAMIN) 100 MCG tablet Take 100 mcg by mouth daily.     vitamin C (ASCORBIC ACID) 500 MG tablet Take 500 mg by mouth daily.     No current facility-administered medications on file prior to visit.    Allergies  Allergen Reactions   Alprazolam Nausea And Vomiting and Other (See Comments)    Altered mental status   Atorvastatin Nausea And Vomiting   Gemfibrozil     unknown   Invokana [Canagliflozin] Diarrhea and Nausea And Vomiting   Definity [Perflutren Lipid Microsphere] Other (See Comments)    Headache    Social History   Substance and Sexual Activity  Alcohol Use No    Social History   Tobacco Use  Smoking Status Former   Packs/day: 1.00   Years: 50.00   Pack years: 50.00   Types: Cigarettes   Quit date: 01/15/2001   Years since quitting: 20.0  Smokeless Tobacco Never    Review of Systems  Constitutional:  Positive for malaise/fatigue.  HENT: Negative.    Eyes: Negative.   Respiratory: Negative.    Cardiovascular: Negative.   Gastrointestinal:  Positive for abdominal pain and nausea.  Genitourinary: Negative.   Musculoskeletal:  Positive for back pain and joint pain.  Skin: Negative.   Neurological: Negative.   Endo/Heme/Allergies: Negative.   Psychiatric/Behavioral: Negative.     Objective   Vitals:   02/02/21 0915  BP: 133/80  Pulse: 95  Resp: 16  Temp: 98.4 F (36.9 C)  SpO2: 91%    Physical Exam Vitals reviewed.  Constitutional:      Appearance: Normal appearance. He is obese. He is not ill-appearing.  HENT:     Head: Normocephalic and atraumatic.  Eyes:     General: No scleral icterus. Cardiovascular:     Rate and Rhythm: Normal rate and regular rhythm.     Heart sounds: Normal heart sounds. No murmur heard.   No friction rub. No gallop.  Pulmonary:     Effort: Pulmonary effort is normal. No respiratory distress.     Breath sounds: Normal  breath sounds. No stridor. No wheezing, rhonchi or rales.  Abdominal:     General: Bowel sounds are normal. There is no distension.     Palpations: Abdomen is soft. There is no mass.     Tenderness: There is abdominal tenderness. There is no guarding or rebound.     Hernia: No hernia is  present.     Comments: Tender in the right upper quadrant to palpation.  Examination limited secondary to body habitus.  Skin:    General: Skin is warm and dry.  Neurological:     Mental Status: He is alert and oriented to person, place, and time.   ER notes reviewed Assessment  Biliary colic secondary to cholelithiasis Plan  Patient is scheduled for laparoscopic cholecystectomy on 02/06/2021.  The risks and benefits of the procedure including bleeding, infection, hepatobiliary injury, cardiopulmonary difficulties, and the possibility of an open procedure were fully explained to the patient, who gave informed consent.

## 2021-02-02 NOTE — H&P (Signed)
[name]; [id number]; Dec 29, 1955   HPI Patient is a 65 year old white male who was referred to my care by Dr. Phillips Odor for evaluation and treatment of biliary colic secondary to cholelithiasis.  Patient was recently seen in the emergency room for a 3-week history of right upper quadrant abdominal pain with nausea.  Ultrasound the gallbladder revealed cholelithiasis with a normal common bile duct.  Since he has gone home, he continues to have right upper quadrant abdominal pain which radiates around his right flank to his back.  Does have nausea.  His appetite is decreased.  No fever, chills, or jaundice have been noted. Past Medical History:  Diagnosis Date   ASCVD (arteriosclerotic cardiovascular disease)    -MI in 01/2001 prompted CABG; EF-30% at cath; 50% on echo in 7/02 and normal in 2005   Asthma    Cerebrovascular disease     L CEA 06/2004 following left renal embolism; 10/2008 plaque w/o focal stenosis   Chronic back pain    Chronic leg pain    Chronic obstructive pulmonary disease (HCC)    Degenerative joint disease     chronic LBP-s/p L3-4 fusion   Diabetes mellitus    -no insulin   Hyperlipidemia    Hypertension    Hypothyroidism    Obesity    Restless leg syndrome    Possible   Tobacco abuse, in remission    50 pack years; discontinued in 2002    Past Surgical History:  Procedure Laterality Date   CAROTID ENDARTERECTOMY  2005   Left   CATARACT EXTRACTION     bilateral   CORONARY ARTERY BYPASS GRAFT  2002   LUMBAR SPINE SURGERY     L3-4 fusion    History reviewed. No pertinent family history.  Current Outpatient Medications on File Prior to Visit  Medication Sig Dispense Refill   albuterol (VENTOLIN HFA) 108 (90 Base) MCG/ACT inhaler Inhale into the lungs.     albuterol-ipratropium (COMBIVENT) 18-103 MCG/ACT inhaler Inhale 2 puffs into the lungs every 6 (six) hours as needed for wheezing or shortness of breath.      aspirin 81 MG tablet Take 81 mg by mouth  daily.       B Complex-Biotin-FA (VITAMIN B50 COMPLEX PO) Take by mouth daily.     Cholecalciferol (VITAMIN D PO) Take 1 capsule by mouth daily.     Continuous Blood Gluc Sensor (FREESTYLE LIBRE 14 DAY SENSOR) MISC Inject 1 each into the skin every 14 (fourteen) days. Use as directed. 6 each 1   cyclobenzaprine (FLEXERIL) 10 MG tablet Take 10 mg by mouth 3 (three) times daily as needed for muscle spasms.     docusate sodium (COLACE) 100 MG capsule Take 100 mg by mouth 2 (two) times daily.       escitalopram (LEXAPRO) 20 MG tablet Take 20 mg by mouth daily.       fish oil-omega-3 fatty acids 1000 MG capsule Take 2 g by mouth daily.     glipiZIDE (GLUCOTROL XL) 10 MG 24 hr tablet Take 1 tablet (10 mg total) by mouth daily with breakfast. 90 tablet 3   glucose blood (ACCU-CHEK AVIVA PLUS) test strip Use as directed to check blood glucose four times daily 400 strip 1   insulin glargine (LANTUS SOLOSTAR) 100 UNIT/ML Solostar Pen Inject 90 Units into the skin at bedtime. 90 mL 2   insulin lispro (HUMALOG) 100 UNIT/ML KwikPen INJECT 20 TO 26 UNITS INTO THE SKIN 3 TIMES DAILY BEFORE MEALS. 30  mL 3   JANUVIA 50 MG tablet TAKE 1 TABLET BY MOUTH DAILY. 90 tablet 0   levothyroxine (SYNTHROID) 175 MCG tablet Take 1 tablet (175 mcg total) by mouth daily before breakfast. 90 tablet 3   losartan (COZAAR) 50 MG tablet Take 1 tablet (50 mg total) by mouth daily. 90 tablet 3   Multiple Vitamins-Minerals (MULTIVITAMIN WITH MINERALS) tablet Take 1 tablet by mouth daily.       nitroGLYCERIN (NITROSTAT) 0.4 MG SL tablet Place under the tongue.     ondansetron (ZOFRAN) 4 MG tablet Take 1 tablet (4 mg total) by mouth every 6 (six) hours as needed for nausea or vomiting. 12 tablet 0   pantoprazole (PROTONIX) 40 MG tablet Take 1 tablet (40 mg total) by mouth daily. 20 tablet 0   rosuvastatin (CRESTOR) 40 MG tablet Take 1 tablet (40 mg total) by mouth daily. 180 tablet 3   SURE COMFORT PEN NEEDLES 31G X 8 MM MISC USE AS  DIRECTED WITH LANTUS AND NOVOLOG UP TO FOUR TIMES DAILY. 100 each 2   vitamin B-12 (CYANOCOBALAMIN) 100 MCG tablet Take 100 mcg by mouth daily.     vitamin C (ASCORBIC ACID) 500 MG tablet Take 500 mg by mouth daily.     No current facility-administered medications on file prior to visit.    Allergies  Allergen Reactions   Alprazolam Nausea And Vomiting and Other (See Comments)    Altered mental status   Atorvastatin Nausea And Vomiting   Gemfibrozil     unknown   Invokana [Canagliflozin] Diarrhea and Nausea And Vomiting   Definity [Perflutren Lipid Microsphere] Other (See Comments)    Headache    Social History   Substance and Sexual Activity  Alcohol Use No    Social History   Tobacco Use  Smoking Status Former   Packs/day: 1.00   Years: 50.00   Pack years: 50.00   Types: Cigarettes   Quit date: 01/15/2001   Years since quitting: 20.0  Smokeless Tobacco Never    Review of Systems  Constitutional:  Positive for malaise/fatigue.  HENT: Negative.    Eyes: Negative.   Respiratory: Negative.    Cardiovascular: Negative.   Gastrointestinal:  Positive for abdominal pain and nausea.  Genitourinary: Negative.   Musculoskeletal:  Positive for back pain and joint pain.  Skin: Negative.   Neurological: Negative.   Endo/Heme/Allergies: Negative.   Psychiatric/Behavioral: Negative.     Objective   Vitals:   02/02/21 0915  BP: 133/80  Pulse: 95  Resp: 16  Temp: 98.4 F (36.9 C)  SpO2: 91%    Physical Exam Vitals reviewed.  Constitutional:      Appearance: Normal appearance. He is obese. He is not ill-appearing.  HENT:     Head: Normocephalic and atraumatic.  Eyes:     General: No scleral icterus. Cardiovascular:     Rate and Rhythm: Normal rate and regular rhythm.     Heart sounds: Normal heart sounds. No murmur heard.   No friction rub. No gallop.  Pulmonary:     Effort: Pulmonary effort is normal. No respiratory distress.     Breath sounds: Normal  breath sounds. No stridor. No wheezing, rhonchi or rales.  Abdominal:     General: Bowel sounds are normal. There is no distension.     Palpations: Abdomen is soft. There is no mass.     Tenderness: There is abdominal tenderness. There is no guarding or rebound.     Hernia: No hernia is  present.     Comments: Tender in the right upper quadrant to palpation.  Examination limited secondary to body habitus.  Skin:    General: Skin is warm and dry.  Neurological:     Mental Status: He is alert and oriented to person, place, and time.   ER notes reviewed Assessment  Biliary colic secondary to cholelithiasis Plan  Patient is scheduled for laparoscopic cholecystectomy on 02/06/2021.  The risks and benefits of the procedure including bleeding, infection, hepatobiliary injury, cardiopulmonary difficulties, and the possibility of an open procedure were fully explained to the patient, who gave informed consent.

## 2021-02-03 ENCOUNTER — Other Ambulatory Visit: Payer: Self-pay

## 2021-02-03 ENCOUNTER — Encounter (HOSPITAL_COMMUNITY): Payer: Self-pay

## 2021-02-03 ENCOUNTER — Encounter (HOSPITAL_COMMUNITY)
Admission: RE | Admit: 2021-02-03 | Discharge: 2021-02-03 | Disposition: A | Discharge status: Home / Self Care | Payer: Medicare Other | Source: Ambulatory Visit | Attending: General Surgery | Admitting: General Surgery

## 2021-02-06 ENCOUNTER — Ambulatory Visit (HOSPITAL_COMMUNITY): Payer: Medicare Other | Admitting: Anesthesiology

## 2021-02-06 ENCOUNTER — Encounter (HOSPITAL_COMMUNITY)
Admission: RE | Disposition: A | Discharge status: Home / Self Care | Payer: Self-pay | Source: Home / Self Care | Attending: General Surgery

## 2021-02-06 ENCOUNTER — Encounter (HOSPITAL_COMMUNITY): Payer: Self-pay | Admitting: General Surgery

## 2021-02-06 ENCOUNTER — Ambulatory Visit (HOSPITAL_COMMUNITY)
Admission: RE | Admit: 2021-02-06 | Discharge: 2021-02-06 | Disposition: A | Discharge status: Home / Self Care | Payer: Medicare Other | Attending: General Surgery | Admitting: General Surgery

## 2021-02-06 DIAGNOSIS — E1159 Type 2 diabetes mellitus with other circulatory complications: Secondary | ICD-10-CM | POA: Diagnosis not present

## 2021-02-06 DIAGNOSIS — Z87891 Personal history of nicotine dependence: Secondary | ICD-10-CM | POA: Diagnosis not present

## 2021-02-06 DIAGNOSIS — Z888 Allergy status to other drugs, medicaments and biological substances status: Secondary | ICD-10-CM | POA: Diagnosis not present

## 2021-02-06 DIAGNOSIS — Z7982 Long term (current) use of aspirin: Secondary | ICD-10-CM | POA: Insufficient documentation

## 2021-02-06 DIAGNOSIS — K8020 Calculus of gallbladder without cholecystitis without obstruction: Secondary | ICD-10-CM

## 2021-02-06 DIAGNOSIS — Z794 Long term (current) use of insulin: Secondary | ICD-10-CM | POA: Diagnosis not present

## 2021-02-06 DIAGNOSIS — Z951 Presence of aortocoronary bypass graft: Secondary | ICD-10-CM | POA: Insufficient documentation

## 2021-02-06 DIAGNOSIS — Z6839 Body mass index (BMI) 39.0-39.9, adult: Secondary | ICD-10-CM | POA: Diagnosis not present

## 2021-02-06 DIAGNOSIS — Z79899 Other long term (current) drug therapy: Secondary | ICD-10-CM | POA: Insufficient documentation

## 2021-02-06 DIAGNOSIS — E669 Obesity, unspecified: Secondary | ICD-10-CM | POA: Diagnosis not present

## 2021-02-06 DIAGNOSIS — K8070 Calculus of gallbladder and bile duct without cholecystitis without obstruction: Secondary | ICD-10-CM | POA: Diagnosis not present

## 2021-02-06 DIAGNOSIS — Z79890 Hormone replacement therapy (postmenopausal): Secondary | ICD-10-CM | POA: Insufficient documentation

## 2021-02-06 DIAGNOSIS — Z7984 Long term (current) use of oral hypoglycemic drugs: Secondary | ICD-10-CM | POA: Insufficient documentation

## 2021-02-06 DIAGNOSIS — K8010 Calculus of gallbladder with chronic cholecystitis without obstruction: Secondary | ICD-10-CM | POA: Diagnosis not present

## 2021-02-06 DIAGNOSIS — E1122 Type 2 diabetes mellitus with diabetic chronic kidney disease: Secondary | ICD-10-CM | POA: Diagnosis not present

## 2021-02-06 DIAGNOSIS — J449 Chronic obstructive pulmonary disease, unspecified: Secondary | ICD-10-CM | POA: Diagnosis not present

## 2021-02-06 HISTORY — PX: CHOLECYSTECTOMY: SHX55

## 2021-02-06 LAB — GLUCOSE, CAPILLARY
Glucose-Capillary: 125 mg/dL — ABNORMAL HIGH (ref 70–99)
Glucose-Capillary: 198 mg/dL — ABNORMAL HIGH (ref 70–99)

## 2021-02-06 SURGERY — LAPAROSCOPIC CHOLECYSTECTOMY
Anesthesia: General | Site: Abdomen

## 2021-02-06 MED ORDER — HYDROMORPHONE HCL 1 MG/ML IJ SOLN
INTRAMUSCULAR | Status: AC
  Filled 2021-02-06: qty 0.5

## 2021-02-06 MED ORDER — MIDAZOLAM HCL 2 MG/2ML IJ SOLN
INTRAMUSCULAR | Status: AC
  Filled 2021-02-06: qty 2

## 2021-02-06 MED ORDER — SUCCINYLCHOLINE CHLORIDE 200 MG/10ML IV SOSY
PREFILLED_SYRINGE | INTRAVENOUS | Status: AC
  Filled 2021-02-06: qty 10

## 2021-02-06 MED ORDER — ROCURONIUM BROMIDE 10 MG/ML (PF) SYRINGE
PREFILLED_SYRINGE | INTRAVENOUS | Status: DC | PRN
  Administered 2021-02-06: 50 mg via INTRAVENOUS

## 2021-02-06 MED ORDER — PROPOFOL 10 MG/ML IV BOLUS
INTRAVENOUS | Status: AC
  Filled 2021-02-06: qty 20

## 2021-02-06 MED ORDER — LACTATED RINGERS IV SOLN
INTRAVENOUS | Status: DC
  Administered 2021-02-06: 1000 mL via INTRAVENOUS

## 2021-02-06 MED ORDER — CHLORHEXIDINE GLUCONATE CLOTH 2 % EX PADS: 6.0000 | MEDICATED_PAD | Freq: Once | CUTANEOUS | Status: DC

## 2021-02-06 MED ORDER — CEFAZOLIN IN SODIUM CHLORIDE 3-0.9 GM/100ML-% IV SOLN
INTRAVENOUS | Status: AC
  Filled 2021-02-06: qty 100

## 2021-02-06 MED ORDER — FENTANYL CITRATE (PF) 100 MCG/2ML IJ SOLN
INTRAMUSCULAR | Status: DC | PRN
  Administered 2021-02-06: 100 ug via INTRAVENOUS
  Administered 2021-02-06: 150 ug via INTRAVENOUS

## 2021-02-06 MED ORDER — SODIUM CHLORIDE 0.9 % IV SOLN
INTRAVENOUS | Status: DC | PRN
  Administered 2021-02-06 (×3): 100 ug via INTRAVENOUS

## 2021-02-06 MED ORDER — EPHEDRINE SULFATE 50 MG/ML IJ SOLN
INTRAMUSCULAR | Status: DC | PRN
  Administered 2021-02-06: 5 mg via INTRAVENOUS

## 2021-02-06 MED ORDER — METOPROLOL TARTRATE 5 MG/5ML IV SOLN
INTRAVENOUS | Status: AC
  Filled 2021-02-06: qty 5

## 2021-02-06 MED ORDER — MIDAZOLAM HCL 5 MG/5ML IJ SOLN
INTRAMUSCULAR | Status: DC | PRN
  Administered 2021-02-06: 2 mg via INTRAVENOUS

## 2021-02-06 MED ORDER — ROCURONIUM BROMIDE 10 MG/ML (PF) SYRINGE
PREFILLED_SYRINGE | INTRAVENOUS | Status: AC
  Filled 2021-02-06: qty 10

## 2021-02-06 MED ORDER — SUGAMMADEX SODIUM 200 MG/2ML IV SOLN
INTRAVENOUS | Status: DC | PRN
  Administered 2021-02-06: 200 mg via INTRAVENOUS

## 2021-02-06 MED ORDER — SODIUM CHLORIDE 0.9 % IR SOLN
Status: DC | PRN
  Administered 2021-02-06: 1000 mL

## 2021-02-06 MED ORDER — LIDOCAINE HCL (PF) 2 % IJ SOLN
INTRAMUSCULAR | Status: AC
  Filled 2021-02-06: qty 5

## 2021-02-06 MED ORDER — METOPROLOL TARTRATE 5 MG/5ML IV SOLN
INTRAVENOUS | Status: DC | PRN
  Administered 2021-02-06 (×2): 2 mg via INTRAVENOUS
  Administered 2021-02-06: 1 mg via INTRAVENOUS

## 2021-02-06 MED ORDER — ORAL CARE MOUTH RINSE: 15.0000 mL | Freq: Once | OROMUCOSAL | Status: DC

## 2021-02-06 MED ORDER — FENTANYL CITRATE (PF) 250 MCG/5ML IJ SOLN
INTRAMUSCULAR | Status: AC
  Filled 2021-02-06: qty 5

## 2021-02-06 MED ORDER — EPHEDRINE 5 MG/ML INJ
INTRAVENOUS | Status: AC
  Filled 2021-02-06: qty 10

## 2021-02-06 MED ORDER — ONDANSETRON HCL 4 MG/2ML IJ SOLN
INTRAMUSCULAR | Status: DC | PRN
  Administered 2021-02-06: 4 mg via INTRAVENOUS

## 2021-02-06 MED ORDER — HYDROMORPHONE HCL 1 MG/ML IJ SOLN
0.2500 mg | INTRAMUSCULAR | Status: DC | PRN
  Administered 2021-02-06 (×2): 0.5 mg via INTRAVENOUS

## 2021-02-06 MED ORDER — SUCCINYLCHOLINE CHLORIDE 20 MG/ML IJ SOLN
INTRAMUSCULAR | Status: DC | PRN
  Administered 2021-02-06: 200 mg via INTRAVENOUS

## 2021-02-06 MED ORDER — BUPIVACAINE LIPOSOME 1.3 % IJ SUSP
INTRAMUSCULAR | Status: DC | PRN
  Administered 2021-02-06: 20 mL

## 2021-02-06 MED ORDER — BUPIVACAINE LIPOSOME 1.3 % IJ SUSP
INTRAMUSCULAR | Status: AC
  Filled 2021-02-06: qty 20

## 2021-02-06 MED ORDER — LIDOCAINE HCL (CARDIAC) PF 100 MG/5ML IV SOSY
PREFILLED_SYRINGE | INTRAVENOUS | Status: DC | PRN
  Administered 2021-02-06: 100 mg via INTRAVENOUS

## 2021-02-06 MED ORDER — ONDANSETRON HCL 4 MG/2ML IJ SOLN
INTRAMUSCULAR | Status: AC
  Filled 2021-02-06: qty 2

## 2021-02-06 MED ORDER — MEPERIDINE HCL 50 MG/ML IJ SOLN: 6.2500 mg | INTRAMUSCULAR | Status: DC | PRN

## 2021-02-06 MED ORDER — KETOROLAC TROMETHAMINE 30 MG/ML IJ SOLN
30.0000 mg | Freq: Once | INTRAMUSCULAR | Status: AC
  Administered 2021-02-06: 30 mg via INTRAVENOUS
  Filled 2021-02-06: qty 1

## 2021-02-06 MED ORDER — HEMOSTATIC AGENTS (NO CHARGE) OPTIME
TOPICAL | Status: DC | PRN
  Administered 2021-02-06 (×2): 1 via TOPICAL

## 2021-02-06 MED ORDER — PROPOFOL 10 MG/ML IV BOLUS
INTRAVENOUS | Status: DC | PRN
  Administered 2021-02-06: 200 mg via INTRAVENOUS

## 2021-02-06 MED ORDER — CEFAZOLIN IN SODIUM CHLORIDE 3-0.9 GM/100ML-% IV SOLN
3.0000 g | INTRAVENOUS | Status: AC
  Administered 2021-02-06: 2 g via INTRAVENOUS

## 2021-02-06 MED ORDER — PHENYLEPHRINE 40 MCG/ML (10ML) SYRINGE FOR IV PUSH (FOR BLOOD PRESSURE SUPPORT)
PREFILLED_SYRINGE | INTRAVENOUS | Status: AC
  Filled 2021-02-06: qty 10

## 2021-02-06 MED ORDER — ONDANSETRON HCL 4 MG/2ML IJ SOLN: 4.0000 mg | Freq: Once | INTRAMUSCULAR | Status: DC | PRN

## 2021-02-06 SURGICAL SUPPLY — 51 items
ADH SKN CLS APL DERMABOND .7 (GAUZE/BANDAGES/DRESSINGS) ×1
APL PRP STRL LF DISP 70% ISPRP (MISCELLANEOUS) ×1
APL SRG 38 LTWT LNG FL B (MISCELLANEOUS) ×1
APPLICATOR ARISTA FLEXITIP XL (MISCELLANEOUS) ×2 IMPLANT
APPLIER CLIP ROT 10 11.4 M/L (STAPLE) ×3
APR CLP MED LRG 11.4X10 (STAPLE) ×1
BAG RETRIEVAL 10 (BASKET) ×1
BAG RETRIEVAL 10MM (BASKET) ×1
CHLORAPREP W/TINT 26 (MISCELLANEOUS) ×3 IMPLANT
CLIP APPLIE ROT 10 11.4 M/L (STAPLE) ×1 IMPLANT
CLOTH BEACON ORANGE TIMEOUT ST (SAFETY) ×3 IMPLANT
COVER LIGHT HANDLE STERIS (MISCELLANEOUS) ×6 IMPLANT
COVER WAND RF STERILE (DRAPES) ×3 IMPLANT
DERMABOND ADVANCED (GAUZE/BANDAGES/DRESSINGS) ×2
DERMABOND ADVANCED .7 DNX12 (GAUZE/BANDAGES/DRESSINGS) ×1 IMPLANT
ELECT REM PT RETURN 9FT ADLT (ELECTROSURGICAL) ×3
ELECTRODE REM PT RTRN 9FT ADLT (ELECTROSURGICAL) ×1 IMPLANT
GLOVE SRG 8 PF TXTR STRL LF DI (GLOVE) IMPLANT
GLOVE SURG SS PI 7.5 STRL IVOR (GLOVE) ×3 IMPLANT
GLOVE SURG UNDER POLY LF SZ7 (GLOVE) ×6 IMPLANT
GLOVE SURG UNDER POLY LF SZ8 (GLOVE) ×3
GOWN STRL REUS W/TWL LRG LVL3 (GOWN DISPOSABLE) ×9 IMPLANT
GOWN STRL REUS W/TWL XL LVL3 (GOWN DISPOSABLE) ×2 IMPLANT
HEMOSTAT ARISTA ABSORB 3G PWDR (HEMOSTASIS) ×2 IMPLANT
HEMOSTAT SNOW SURGICEL 2X4 (HEMOSTASIS) ×3 IMPLANT
INST SET LAPROSCOPIC AP (KITS) ×3 IMPLANT
KIT TURNOVER KIT A (KITS) ×3 IMPLANT
MANIFOLD NEPTUNE II (INSTRUMENTS) ×3 IMPLANT
NDL HYPO 18GX1.5 BLUNT FILL (NEEDLE) ×1 IMPLANT
NDL HYPO 21X1.5 SAFETY (NEEDLE) ×1 IMPLANT
NDL INSUFFLATION 14GA 120MM (NEEDLE) ×1 IMPLANT
NEEDLE HYPO 18GX1.5 BLUNT FILL (NEEDLE) ×3 IMPLANT
NEEDLE HYPO 21X1.5 SAFETY (NEEDLE) ×3 IMPLANT
NEEDLE INSUFFLATION 14GA 120MM (NEEDLE) ×3 IMPLANT
NS IRRIG 1000ML POUR BTL (IV SOLUTION) ×3 IMPLANT
PACK LAP CHOLE LZT030E (CUSTOM PROCEDURE TRAY) ×3 IMPLANT
PAD ARMBOARD 7.5X6 YLW CONV (MISCELLANEOUS) ×5 IMPLANT
SET BASIN LINEN APH (SET/KITS/TRAYS/PACK) ×3 IMPLANT
SET TUBE SMOKE EVAC HIGH FLOW (TUBING) ×3 IMPLANT
SLEEVE ENDOPATH XCEL 5M (ENDOMECHANICALS) ×3 IMPLANT
SUT MNCRL AB 4-0 PS2 18 (SUTURE) ×6 IMPLANT
SUT VICRYL 0 UR6 27IN ABS (SUTURE) ×3 IMPLANT
SYR 20ML LL LF (SYRINGE) ×4 IMPLANT
SYS BAG RETRIEVAL 10MM (BASKET) ×1
SYSTEM BAG RETRIEVAL 10MM (BASKET) ×1 IMPLANT
TROCAR ENDO BLADELESS 11MM (ENDOMECHANICALS) ×3 IMPLANT
TROCAR XCEL NON-BLD 5MMX100MML (ENDOMECHANICALS) ×3 IMPLANT
TROCAR XCEL UNIV SLVE 11M 100M (ENDOMECHANICALS) ×3 IMPLANT
TUBE CONNECTING 12'X1/4 (SUCTIONS) ×1
TUBE CONNECTING 12X1/4 (SUCTIONS) ×2 IMPLANT
WARMER LAPAROSCOPE (MISCELLANEOUS) ×3 IMPLANT

## 2021-02-06 NOTE — Op Note (Signed)
Patient:  [name]  DOB:  Mar 25, 1956  MRN:  [id number]   Preop Diagnosis: Biliary colic, cholelithiasis  Postop Diagnosis: Same  Procedure: Laparoscopic cholecystectomy  Surgeon: Franky Macho, MD  Anes: General endotracheal  Indications: Patient is a 65 year old white male who presents with biliary colic secondary to cholelithiasis.  The risks and benefits of the procedure including bleeding, infection, hepatobiliary injury, and the possibility of an open procedure were fully explained to the patient, who gave informed consent.  Procedure note: The patient was placed in the supine position.  After induction of general endotracheal anesthesia, the abdomen was prepped and draped using the usual sterile technique with ChloraPrep.  Surgical site confirmation was performed.  A supraumbilical incision was made down to the fascia.  Veress needle was introduced into the abdominal cavity and confirmation of placement was done using the saline drop test.  The abdomen was then insufflated to 15 mmHg pressure.  An 64mm trocar was introduced into the abdominal cavity under direct visualization without difficulty.  The patient was placed in reverse Trendelenburg position and an additional 11 mm trocar was placed in the epigastric region and 5 mm trochars were placed in the right upper quadrant and right flank regions.  The liver was inspected and noted to have some early fatty changes present with minimal nodularity.  The gallbladder was retracted in a dynamic fashion in order to provide a critical view of the triangle of Calot.  The cystic duct was first identified.  Its juncture to the infundibulum was fully identified.  Endoclips were placed proximally and distally on the cystic duct, and the cystic duct was divided.  This was likewise done to the cystic artery.  The gallbladder was freed away from the gallbladder fossa using Bovie electrocautery.  The gallbladder was delivered through the epigastric  trocar site using an Endo Catch bag.  The gallbladder fossa was inspected and any bleeding was controlled using Bovie electrocautery.  No bile leakage was noted.  Arista and Surgicel were placed on the gallbladder fossa.  All fluid and air were then evacuated from the abdominal cavity prior to removal of the trochars.  All wounds were irrigated with normal saline.  All wounds were injected with Exparel.  All incisions were closed using a 4-0 Monocryl subcuticular suture.  Dermabond was applied.  All tape and needle counts were correct at the end of the procedure.  Patient was extubated in the operating room and transferred to PACU in stable condition.  Complications: None  EBL: Minimal  Specimen: Gallbladder

## 2021-02-06 NOTE — Transfer of Care (Signed)
Immediate Anesthesia Transfer of Care Note  Patient: [name]  Procedure(s) Performed: LAPAROSCOPIC CHOLECYSTECTOMY (Abdomen)  Patient Location: PACU  Anesthesia Type:General  Level of Consciousness: awake, alert , drowsy and patient cooperative  Airway & Oxygen Therapy: Patient Spontanous Breathing and Patient connected to nasal cannula oxygen  Post-op Assessment: Report given to RN, Post -op Vital signs reviewed and stable and Patient moving all extremities X 4  Post vital signs: Reviewed and stable  Last Vitals:  Vitals Value Taken Time  BP    Temp    Pulse 88 02/06/21 0818  Resp 16 02/06/21 0818  SpO2 94 % 02/06/21 0818  Vitals shown include unvalidated device data.  Last Pain:  Vitals:   02/06/21 0655  TempSrc: Oral  PainSc: 0-No pain      Patients Stated Pain Goal: 8 (02/06/21 6579)  Complications: No notable events documented.

## 2021-02-06 NOTE — Anesthesia Procedure Notes (Signed)
Procedure Name: Intubation Date/Time: 02/06/2021 7:29 AM Performed by: Brynda Peon, CRNA Pre-anesthesia Checklist: Patient identified, Emergency Drugs available, Suction available, Patient being monitored and Timeout performed Patient Re-evaluated:Patient Re-evaluated prior to induction Oxygen Delivery Method: Circle system utilized Preoxygenation: Pre-oxygenation with 100% oxygen Induction Type: IV induction Laryngoscope Size: Miller and 3 Grade View: Grade I Tube type: Oral Tube size: 7.5 mm Number of attempts: 1 Airway Equipment and Method: Stylet Placement Confirmation: ETT inserted through vocal cords under direct vision, positive ETCO2, CO2 detector and breath sounds checked- equal and bilateral Secured at: 22 cm Tube secured with: Tape Dental Injury: Teeth and Oropharynx as per pre-operative assessment

## 2021-02-06 NOTE — Discharge Instructions (Signed)

## 2021-02-06 NOTE — Anesthesia Postprocedure Evaluation (Signed)
Anesthesia Post Note  Patient: [name]  Procedure(s) Performed: LAPAROSCOPIC CHOLECYSTECTOMY (Abdomen)  Patient location during evaluation: PACU Anesthesia Type: General Level of consciousness: awake and alert and oriented Pain management: pain level controlled Vital Signs Assessment: post-procedure vital signs reviewed and stable Respiratory status: spontaneous breathing and respiratory function stable Cardiovascular status: blood pressure returned to baseline and stable Postop Assessment: no apparent nausea or vomiting Anesthetic complications: no   No notable events documented.   Last Vitals:  Vitals:   02/06/21 0900 02/06/21 0916  BP: 132/60 (!) 136/93  Pulse: 81   Resp: 11   Temp:  36.6 C  SpO2: 94% 93%    Last Pain:  Vitals:   02/06/21 0916  TempSrc: Oral  PainSc: 5                  Nobel Brar C Jerrel Tiberio

## 2021-02-06 NOTE — Interval H&P Note (Signed)
History and Physical Interval Note:  02/06/2021 7:08 AM  [name]  has presented today for surgery, with the diagnosis of Cholelithiasis K80.20.  The various methods of treatment have been discussed with the patient and family. After consideration of risks, benefits and other options for treatment, the patient has consented to  Procedure(s): LAPAROSCOPIC CHOLECYSTECTOMY (N/A) as a surgical intervention.  The patient's history has been reviewed, patient examined, no change in status, stable for surgery.  I have reviewed the patient's chart and labs.  Questions were answered to the patient's satisfaction.     Franky Macho

## 2021-02-06 NOTE — Anesthesia Preprocedure Evaluation (Addendum)
Anesthesia Evaluation  Patient identified by MRN, date of birth, ID band Patient awake    Reviewed: Allergy & Precautions, NPO status , Patient's Chart, lab work & pertinent test results  Airway Mallampati: II  TM Distance: >3 FB Neck ROM: Full    Dental  (+) Edentulous Upper, Edentulous Lower   Pulmonary shortness of breath, asthma , COPD,  COPD inhaler, former smoker,    Pulmonary exam normal breath sounds clear to auscultation       Cardiovascular Exercise Tolerance: Poor hypertension, Pt. on medications + CABG, + Peripheral Vascular Disease (left carotid sx) and + DOE   Rhythm:Regular Rate:Tachycardia - Systolic murmurs, - Diastolic murmurs, - Friction Rub, - Carotid Bruit, - Peripheral Edema and - Systolic Click 1. Left ventricular ejection fraction, by estimation, is approximately 55%. The left ventricle has normal function. The left ventricle demonstrates regional wall motion abnormalities (see scoring diagram/findings for description). There is moderate left ventricular hypertrophy. Left ventricular diastolic parameters are indeterminate. Definite contrast shows no mural thormbus in region of apical anteroseptal akinesis.  2. Right ventricular systolic function is normal. The right ventricular size is normal. Tricuspid regurgitation signal is inadequate for assessing PA pressure.  3. The mitral valve is grossly normal. Trivial mitral valve  regurgitation.  4. The aortic valve is tricuspid. Aortic valve regurgitation is not visualized. Mild to moderate aortic valve sclerosis/calcification is present, without any evidence of aortic stenosis.  5. The inferior vena cava is normal in size with greater than 50% respiratory variability, suggesting right atrial pressure of 3 mmHg.  21-May-2020 14:00:25 [facility] ROUTINE RECORD Normal sinus rhythm Possible Left atrial enlargement Septal infarct , age  undetermined Abnormal ECG Confirmed by Benjiman Core [phone]) on 05/21/2020 2:57:35 PM    Neuro/Psych  Neuromuscular disease (RLS) negative psych ROS   GI/Hepatic Neg liver ROS, GERD  Controlled,  Endo/Other  diabetes, Poorly Controlled, Type 2, Insulin Dependent, Oral Hypoglycemic AgentsHypothyroidism   Renal/GU Renal disease     Musculoskeletal  (+) Arthritis  (chronic back pain), Osteoarthritis,    Abdominal   Peds  Hematology   Anesthesia Other Findings   Reproductive/Obstetrics                          Anesthesia Physical Anesthesia Plan  ASA: 3  Anesthesia Plan: General   Post-op Pain Management:    Induction: Intravenous  PONV Risk Score and Plan: 4 or greater and Ondansetron, Dexamethasone and Midazolam  Airway Management Planned: Oral ETT  Additional Equipment:   Intra-op Plan:   Post-operative Plan: Extubation in OR  Informed Consent: I have reviewed the patients History and Physical, chart, labs and discussed the procedure including the risks, benefits and alternatives for the proposed anesthesia with the patient or authorized representative who has indicated his/her understanding and acceptance.       Plan Discussed with: CRNA and Surgeon  Anesthesia Plan Comments:        Anesthesia Quick Evaluation

## 2021-02-07 ENCOUNTER — Encounter (HOSPITAL_COMMUNITY): Payer: Self-pay | Admitting: General Surgery

## 2021-02-07 HISTORY — PX: GALLBLADDER SURGERY: SHX652

## 2021-02-07 LAB — SURGICAL PATHOLOGY

## 2021-02-09 DIAGNOSIS — J449 Chronic obstructive pulmonary disease, unspecified: Secondary | ICD-10-CM | POA: Diagnosis not present

## 2021-02-09 DIAGNOSIS — E1122 Type 2 diabetes mellitus with diabetic chronic kidney disease: Secondary | ICD-10-CM | POA: Diagnosis not present

## 2021-02-09 DIAGNOSIS — I10 Essential (primary) hypertension: Secondary | ICD-10-CM | POA: Diagnosis not present

## 2021-02-09 DIAGNOSIS — E782 Mixed hyperlipidemia: Secondary | ICD-10-CM | POA: Diagnosis not present

## 2021-02-21 DIAGNOSIS — E1165 Type 2 diabetes mellitus with hyperglycemia: Secondary | ICD-10-CM | POA: Diagnosis not present

## 2021-02-21 DIAGNOSIS — G720 Drug-induced myopathy: Secondary | ICD-10-CM | POA: Diagnosis not present

## 2021-02-21 DIAGNOSIS — T466X5A Adverse effect of antihyperlipidemic and antiarteriosclerotic drugs, initial encounter: Secondary | ICD-10-CM | POA: Diagnosis not present

## 2021-03-09 DIAGNOSIS — E1159 Type 2 diabetes mellitus with other circulatory complications: Secondary | ICD-10-CM | POA: Diagnosis not present

## 2021-03-09 DIAGNOSIS — Z794 Long term (current) use of insulin: Secondary | ICD-10-CM | POA: Diagnosis not present

## 2021-03-09 DIAGNOSIS — E1122 Type 2 diabetes mellitus with diabetic chronic kidney disease: Secondary | ICD-10-CM | POA: Diagnosis not present

## 2021-03-12 DIAGNOSIS — J449 Chronic obstructive pulmonary disease, unspecified: Secondary | ICD-10-CM | POA: Diagnosis not present

## 2021-03-12 DIAGNOSIS — I10 Essential (primary) hypertension: Secondary | ICD-10-CM | POA: Diagnosis not present

## 2021-03-12 DIAGNOSIS — E782 Mixed hyperlipidemia: Secondary | ICD-10-CM | POA: Diagnosis not present

## 2021-03-12 DIAGNOSIS — E1122 Type 2 diabetes mellitus with diabetic chronic kidney disease: Secondary | ICD-10-CM | POA: Diagnosis not present

## 2021-03-13 DIAGNOSIS — E1159 Type 2 diabetes mellitus with other circulatory complications: Secondary | ICD-10-CM | POA: Diagnosis not present

## 2021-03-13 DIAGNOSIS — E039 Hypothyroidism, unspecified: Secondary | ICD-10-CM | POA: Diagnosis not present

## 2021-03-13 NOTE — Patient Instructions (Signed)

## 2021-03-14 ENCOUNTER — Ambulatory Visit (INDEPENDENT_AMBULATORY_CARE_PROVIDER_SITE_OTHER): Payer: Medicare Other | Admitting: Nurse Practitioner

## 2021-03-14 ENCOUNTER — Other Ambulatory Visit: Payer: Self-pay

## 2021-03-14 ENCOUNTER — Encounter: Payer: Self-pay | Admitting: Nurse Practitioner

## 2021-03-14 VITALS — BP 137/82 | HR 101 | Ht 69.0 in | Wt 264.6 lb

## 2021-03-14 DIAGNOSIS — E1159 Type 2 diabetes mellitus with other circulatory complications: Secondary | ICD-10-CM | POA: Diagnosis not present

## 2021-03-14 DIAGNOSIS — E039 Hypothyroidism, unspecified: Secondary | ICD-10-CM

## 2021-03-14 DIAGNOSIS — E782 Mixed hyperlipidemia: Secondary | ICD-10-CM

## 2021-03-14 DIAGNOSIS — E559 Vitamin D deficiency, unspecified: Secondary | ICD-10-CM | POA: Diagnosis not present

## 2021-03-14 DIAGNOSIS — I10 Essential (primary) hypertension: Secondary | ICD-10-CM | POA: Diagnosis not present

## 2021-03-14 LAB — COMPREHENSIVE METABOLIC PANEL
ALT: 72 IU/L — ABNORMAL HIGH (ref 0–44)
AST: 38 IU/L (ref 0–40)
Albumin/Globulin Ratio: 1.8 (ref 1.2–2.2)
Albumin: 5 g/dL — ABNORMAL HIGH (ref 3.8–4.8)
Alkaline Phosphatase: 70 IU/L (ref 44–121)
BUN/Creatinine Ratio: 21 (ref 10–24)
BUN: 28 mg/dL — ABNORMAL HIGH (ref 8–27)
Bilirubin Total: 0.4 mg/dL (ref 0.0–1.2)
CO2: 23 mmol/L (ref 20–29)
Calcium: 10.9 mg/dL — ABNORMAL HIGH (ref 8.6–10.2)
Chloride: 98 mmol/L (ref 96–106)
Creatinine, Ser: 1.35 mg/dL — ABNORMAL HIGH (ref 0.76–1.27)
Globulin, Total: 2.8 g/dL (ref 1.5–4.5)
Glucose: 147 mg/dL — ABNORMAL HIGH (ref 65–99)
Potassium: 4.6 mmol/L (ref 3.5–5.2)
Sodium: 138 mmol/L (ref 134–144)
Total Protein: 7.8 g/dL (ref 6.0–8.5)
eGFR: 58 mL/min/{1.73_m2} — ABNORMAL LOW (ref >59)

## 2021-03-14 LAB — T4, FREE: Free T4: 1.26 ng/dL (ref 0.82–1.77)

## 2021-03-14 LAB — TSH: TSH: 4.69 u[IU]/mL — ABNORMAL HIGH (ref 0.450–4.500)

## 2021-03-14 MED ORDER — GLIPIZIDE ER 10 MG PO TB24: 10.0000 mg | ORAL_TABLET | Freq: Every day | ORAL | 3 refills | Status: DC

## 2021-03-14 MED ORDER — FREESTYLE LIBRE 14 DAY SENSOR MISC
1.0000 | 1 refills | Status: DC
Start: 2021-03-14 — End: 2022-01-17

## 2021-03-14 MED ORDER — SITAGLIPTIN PHOSPHATE 50 MG PO TABS: 50.0000 mg | ORAL_TABLET | Freq: Every day | ORAL | 3 refills | Status: DC

## 2021-03-14 NOTE — Progress Notes (Signed)
03/14/2021   Endocrinology follow-up note   Subjective:    Patient ID: [name], male    DOB: May 08, 1956,    Past Medical History:  Diagnosis Date   ASCVD (arteriosclerotic cardiovascular disease)    -MI in 01/2001 prompted CABG; EF-30% at cath; 50% on echo in 7/02 and normal in 2005   Asthma    Cerebrovascular disease     L CEA 06/2004 following left renal embolism; 10/2008 plaque w/o focal stenosis   Chronic back pain    Chronic leg pain    Chronic obstructive pulmonary disease (HCC)    Degenerative joint disease     chronic LBP-s/p L3-4 fusion   Diabetes mellitus    -no insulin   Hyperlipidemia    Hypertension    Hypothyroidism    Obesity    Restless leg syndrome    Possible   Tobacco abuse, in remission    50 pack years; discontinued in 2002   Past Surgical History:  Procedure Laterality Date   CAROTID ENDARTERECTOMY  2005   Left   CATARACT EXTRACTION     bilateral   CHOLECYSTECTOMY N/A 02/06/2021   Procedure: LAPAROSCOPIC CHOLECYSTECTOMY;  Surgeon: Franky Macho, MD;  Location: [facility];  Service: General;  Laterality: N/A;   CORONARY ARTERY BYPASS GRAFT  2002   LUMBAR SPINE SURGERY     L3-4 fusion   Social History   Socioeconomic History   Marital status: Divorced    Spouse name: Not on file   Number of children: 1   Years of education: Not on file   Highest education level: Not on file  Occupational History   Occupation: veteran    Comment: disabledf  Tobacco Use   Smoking status: Former    Packs/day: 1.00    Years: 50.00    Pack years: 50.00    Types: Cigarettes    Quit date: 01/15/2001    Years since quitting: 20.1   Smokeless tobacco: Never  Vaping Use   Vaping Use: Never used  Substance and Sexual Activity   Alcohol use: No   Drug use: No   Sexual activity: Not Currently  Other Topics Concern   Not on file  Social History Narrative   Not on file   Social Determinants of Health   Financial Resource Strain: Not on file  Food  Insecurity: Not on file  Transportation Needs: Not on file  Physical Activity: Not on file  Stress: Not on file  Social Connections: Not on file   Outpatient Encounter Medications as of 03/14/2021  Medication Sig   albuterol (VENTOLIN HFA) 108 (90 Base) MCG/ACT inhaler Inhale 1-2 puffs into the lungs every 6 (six) hours as needed for shortness of breath or wheezing.   albuterol-ipratropium (COMBIVENT) 18-103 MCG/ACT inhaler Inhale 2 puffs into the lungs every 6 (six) hours as needed for wheezing or shortness of breath.    Ascorbic Acid (VITAMIN C) 1000 MG tablet Take 2,000 mg by mouth daily.   aspirin 81 MG tablet Take 81 mg by mouth daily.     B Complex-Biotin-FA (VITAMIN B50 COMPLEX PO) Take 1 capsule by mouth daily.   Cholecalciferol (VITAMIN D PO) Take 1 capsule by mouth daily.   Continuous Blood Gluc Sensor (FREESTYLE LIBRE 14 DAY SENSOR) MISC Inject 1 each into the skin every 14 (fourteen) days. Use as directed.   Cyanocobalamin ([employer] B-12) 1000 MCG/15ML LIQD Place 15 mLs under the tongue 2 (two) times a week.   cyclobenzaprine (FLEXERIL) 10  MG tablet Take 10 mg by mouth 3 (three) times daily as needed for muscle spasms.   docusate sodium (COLACE) 100 MG capsule Take 200 mg by mouth daily.   escitalopram (LEXAPRO) 20 MG tablet Take 20 mg by mouth at bedtime.   fish oil-omega-3 fatty acids 1000 MG capsule Take 2 g by mouth daily.   glipiZIDE (GLUCOTROL XL) 10 MG 24 hr tablet Take 1 tablet (10 mg total) by mouth daily with breakfast.   glucose blood (ACCU-CHEK AVIVA PLUS) test strip Use as directed to check blood glucose four times daily   insulin glargine (LANTUS SOLOSTAR) 100 UNIT/ML Solostar Pen Inject 90 Units into the skin at bedtime.   insulin lispro (HUMALOG) 100 UNIT/ML KwikPen INJECT 20 TO 26 UNITS INTO THE SKIN 3 TIMES DAILY BEFORE MEALS.   levothyroxine (SYNTHROID) 175 MCG tablet Take 1 tablet (175 mcg total) by mouth daily before breakfast.   losartan (COZAAR) 50 MG tablet Take  1 tablet (50 mg total) by mouth daily.   Multiple Vitamins-Minerals (MULTIVITAMIN WITH MINERALS) tablet Take 1 tablet by mouth daily.     nitroGLYCERIN (NITROSTAT) 0.4 MG SL tablet Place 0.4 mg under the tongue every 5 (five) minutes as needed for chest pain.   ondansetron (ZOFRAN) 4 MG tablet Take 1 tablet (4 mg total) by mouth every 6 (six) hours as needed for nausea or vomiting. (Patient not taking: Reported on 03/14/2021)   pantoprazole (PROTONIX) 40 MG tablet Take 1 tablet (40 mg total) by mouth daily. (Patient not taking: Reported on 03/14/2021)   rosuvastatin (CRESTOR) 40 MG tablet Take 1 tablet (40 mg total) by mouth daily.   sitaGLIPtin (JANUVIA) 50 MG tablet Take 1 tablet (50 mg total) by mouth daily.   SURE COMFORT PEN NEEDLES 31G X 8 MM MISC USE AS DIRECTED WITH LANTUS AND NOVOLOG UP TO FOUR TIMES DAILY.   [DISCONTINUED] Continuous Blood Gluc Sensor (FREESTYLE LIBRE 14 DAY SENSOR) MISC Inject 1 each into the skin every 14 (fourteen) days. Use as directed.   [DISCONTINUED] glipiZIDE (GLUCOTROL XL) 10 MG 24 hr tablet Take 1 tablet (10 mg total) by mouth daily with breakfast.   [DISCONTINUED] JANUVIA 50 MG tablet TAKE 1 TABLET BY MOUTH DAILY.   No facility-administered encounter medications on file as of 03/14/2021.   ALLERGIES: Allergies  Allergen Reactions   Alprazolam Nausea And Vomiting and Other (See Comments)    Altered mental status   Atorvastatin Nausea And Vomiting   Gemfibrozil     unknown   Invokana [Canagliflozin] Diarrhea and Nausea And Vomiting   Definity [Perflutren Lipid Microsphere] Other (See Comments)    Headache   VACCINATION STATUS: Immunization History  Administered Date(s) Administered   Influenza-Unspecified 05/13/2014    Diabetes He presents for his follow-up diabetic visit. He has type 2 diabetes mellitus. Onset time: He was diagnosed at approximate age of 50 years. His disease course has been improving. Pertinent negatives for hypoglycemia include no  confusion, headaches, nervousness/anxiousness, pallor, sweats or tremors. Associated symptoms include foot paresthesias. Pertinent negatives for diabetes include no fatigue, no polydipsia, no polyphagia, no polyuria and no weight loss. There are no hypoglycemic complications. Symptoms are stable. Diabetic complications include a CVA, heart disease, nephropathy and peripheral neuropathy. Risk factors for coronary artery disease include diabetes mellitus, dyslipidemia, hypertension, male sex, tobacco exposure, sedentary lifestyle and obesity. Current diabetic treatment includes intensive insulin program and oral agent (dual therapy). He is compliant with treatment most of the time. His weight is fluctuating minimally. He  is following a diabetic and generally healthy diet. Meal planning includes avoidance of concentrated sweets. He has had a previous visit with a dietitian. He participates in exercise intermittently. His home blood glucose trend is decreasing steadily. His breakfast blood glucose range is generally 140-180 mg/dl. His lunch blood glucose range is generally 140-180 mg/dl. His dinner blood glucose range is generally 140-180 mg/dl. His bedtime blood glucose range is generally 140-180 mg/dl. His overall blood glucose range is 140-180 mg/dl. (He presents today with his CGM, no logs, showing improved glycemic profile overall.  His previsit A1c at his PCP office was 7.6% on 02/21/21, improving from last visit of 8.6%.  Analysis of his CGM shows TIR 78%, TAR 19%, TBR 3%.  He denies any significant hypoglycemia.  He had his gallbladder removed in June and has now fully recovered. ) An ACE inhibitor/angiotensin II receptor blocker is being taken. He does not see a podiatrist.Eye exam is current.  Hyperlipidemia This is a chronic problem. The current episode started more than 1 year ago. The problem is uncontrolled. Recent lipid tests were reviewed and are variable. Exacerbating diseases include diabetes,  hypothyroidism and obesity. He has no history of chronic renal disease. There are no known factors aggravating his hyperlipidemia. Associated symptoms include myalgias. Current antihyperlipidemic treatment includes statins. The current treatment provides moderate improvement of lipids. There are no compliance problems.  Risk factors for coronary artery disease include family history, dyslipidemia, diabetes mellitus, hypertension, male sex, a sedentary lifestyle and obesity.  Thyroid Problem Presents for follow-up (He has longstanding hypothyroidism, currently on levothyroxine 150 mcg p.o. daily before breakfast.  He reports compliance with medication.) visit. Patient reports no anxiety, cold intolerance, constipation, depressed mood, diarrhea, fatigue, heat intolerance, palpitations, tremors, weight gain or weight loss. The symptoms have been stable. His past medical history is significant for diabetes and hyperlipidemia.    Review of systems  Constitutional: + Minimally fluctuating body weight,  current Body mass index is 39.07 kg/m. , no fatigue, no subjective hyperthermia, no subjective hypothermia Eyes: no blurry vision, no xerophthalmia ENT: no sore throat, no nodules palpated in throat, no dysphagia/odynophagia, no hoarseness Cardiovascular: no chest pain, no shortness of breath, no palpitations, no leg swelling Respiratory: no cough, no shortness of breath Gastrointestinal: no nausea/vomiting/diarrhea Musculoskeletal: no muscle/joint aches Skin: no rashes, no hyperemia Neurological: no tremors, no numbness, no tingling, no dizziness Psychiatric: no depression, no anxiety   Objective:    BP 137/82   Pulse (!) 101   Ht 5\' 9"  (1.753 m)   Wt 264 lb 9.6 oz (120 kg)   BMI 39.07 kg/m   Wt Readings from Last 3 Encounters:  03/14/21 264 lb 9.6 oz (120 kg)  02/03/21 266 lb (120.7 kg)  02/02/21 266 lb (120.7 kg)   BP Readings from Last 3 Encounters:  03/14/21 137/82  02/06/21 (!)  136/93  02/02/21 133/80     Physical Exam- Limited  Constitutional:  Body mass index is 39.07 kg/m. , not in acute distress, normal state of mind Eyes:  EOMI, no exophthalmos Neck: Supple Cardiovascular: RRR, no murmurs, rubs, or gallops, no edema Respiratory: Adequate breathing efforts, no crackles, rales, rhonchi, or wheezing Musculoskeletal: no gross deformities, strength intact in all four extremities, no gross restriction of joint movements Skin:  no rashes, no hyperemia Neurological: no tremor with outstretched hands    Results for orders placed or performed during the hospital encounter of 02/06/21  Glucose, capillary  Result Value Ref Range   Glucose-Capillary 125 (  H) 70 - 99 mg/dL  Glucose, capillary  Result Value Ref Range   Glucose-Capillary 198 (H) 70 - 99 mg/dL  Surgical pathology  Result Value Ref Range   SURGICAL PATHOLOGY      SURGICAL PATHOLOGY CASE: [id number] PATIENT: Marcelline Mates Surgical Pathology Report     Clinical History: cholelithiasis K80.20     FINAL MICROSCOPIC DIAGNOSIS:  A. GALLBLADDER, CHOLECYSTECTOMY: - Chronic cholecystitis. - Cholesterolosis. - Cholelithiasis. - Benign lymph node.   GROSS DESCRIPTION:  Size/?Intact: 8.7 x 3.5 x 2.8 cm intact gallbladder, received in formalin Serosal surface: Smooth, tan to hyperemic Mucosa/Wall: The mucosa is glistening, bile-stained and the wall measures up to 0.4 cm in thickness. Contents: The gallbladder contains bile and 6 dark green calculi measuring 0.6 to 1 cm in greatest dimension. Cystic duct: The cystic duct is patent.  Adjacent to the cystic duct there is a 0.8 cm rubbery pink nodule. Block Summary: 1 block submitted (GRP 02/06/2021)    Final Diagnosis performed by Jimmy Picket, MD.   Electronically signed 02/07/2021 Technical component performed at [facility], [address] , [place], [place] [address].  Professional component performed at  [facility], [address] [place], [place] [address].  Immunohistochemistry Technical component (if applicable) was performed at [facility]. [address], [address], [place], [place] [address].   IMMUNOHISTOCHEMISTRY DISCLAIMER (if applicable): Some of these immunohistochemical stains may have been developed and the performance characteristics determine by [facility]. Some may not have been cleared or approved by the U.S. Food and Drug Administration. The FDA has determined that such clearance or approval is not necessary. This test is used for clinical purposes. It should not be regarded as investigational or for research. This laboratory is certified under the Clinical Laboratory Improvement Amendments of 1988 (CLIA-88) as qualified to perform high complexity clinical laboratory testing.  The controls stained appropriately.    Lab Results  Component Value Date   WBC 11.9 (H) 01/31/2021   HGB 14.1 01/31/2021   HCT 43.5 01/31/2021   MCV 91.6 01/31/2021   PLT 227 01/31/2021    Lab Results  Component Value Date   HGBA1C 8.6 (A) 11/09/2020   HGBA1C 8.0 (A) 07/11/2020   HGBA1C 7.3 (A) 03/08/2020     Lipid Panel     Component Value Date/Time   CHOL 151 10/03/2020 0941   TRIG 225 (H) 10/03/2020 0941   TRIG 329 10/02/2007 0000   HDL 41 10/03/2020 0941   CHOLHDL 3.7 10/03/2020 0941   CHOLHDL 4.9 02/26/2020 1002   VLDL 19 09/09/2018 0949   LDLCALC 73 10/03/2020 0941   LDLCALC 136 (H) 02/26/2020 1002   LDLCALC 66 10/02/2007 0000     Assessment & Plan:   1) Type 2 diabetes mellitus with stage 3 chronic kidney disease, with long-term current use of insulin   He presents today with his CGM, no logs, showing improved glycemic profile overall.  His previsit A1c at his PCP office was 7.6% on 02/21/21, improving from last visit of 8.6%.  Analysis of his CGM shows TIR 78%, TAR 19%, TBR 3%.  He denies any significant  hypoglycemia.  He had his gallbladder removed in June and has now fully recovered.   Recent labs reviewed.  - His diabetes is complicated by CAD, CKD, CVA, Retinopathy. Patient remains at a high risk for more acute and chronic complications of diabetes which include CAD, CVA, CKD, retinopathy, and neuropathy. These are all discussed in detail  with the patient.  - Nutritional counseling repeated at each appointment due to patients tendency to fall back in to old habits.  - The patient admits there is a room for improvement in their diet and drink choices. -  Suggestion is made for the patient to avoid simple carbohydrates from their diet including Cakes, Sweet Desserts / Pastries, Ice Cream, Soda (diet and regular), Sweet Tea, Candies, Chips, Cookies, Sweet Pastries, Store Bought Juices, Alcohol in Excess of 1-2 drinks a day, Artificial Sweeteners, Coffee Creamer, and "Sugar-free" Products. This will help patient to have stable blood glucose profile and potentially avoid unintended weight gain.   - I encouraged the patient to switch to unprocessed or minimally processed complex starch and increased protein intake (animal or plant source), fruits, and vegetables.   - Patient is advised to stick to a routine mealtimes to eat 3 meals a day and avoid unnecessary snacks (to snack only to correct hypoglycemia).  -He will continue to require intensive treatment with basal/bolus insulin in order for him to achieve and maintain control of diabetes to target.    -Based on his stable, at goal glycemic profile, he is advised to continue his current regimen of Lantus 100 units SQ nightly, Humalog 20-26 units TID with meals if glucose is above 90 and he is eating (Specific instructions on how to titrate insulin dosage based on glucose readings given to patient in writing).  Continue Januvia 50 mg po daily and Glipizide 10 mg XL daily with breakfast.   -He is encouraged to continue using his CGM to monitor  glucose 4 times daily, before meals and before bed, and to call the clinic if he has readings less than 70 or greater than 200 for 3 tests in a row.  -Due to CKD patient is not a candidate for Metformin , SGLT2i.  -Target numbers for A1c, LDL, HDL, Triglycerides, Waist Circumference were discussed in detail.  2) Lipids/HPL:  His recent lipid panel from 10/03/20 shows controlled LDL of 73 and elevated triglycerides of 225.  He is advised to continue Crestor 5 mg p.o. daily at bedtime, omega-3 fatty acid supplement, and fenofibrate 160 mg p.o. daily.  Side effects and precautions discussed with him.  3) Hypothyroidism:   -His previsit thyroid function tests have improved, now with TSH only marginally elevated.  He is advised to continue current dose of Levothyroxine 175 mcg po daily before breakfast.    - We discussed about the correct intake of his thyroid hormone, on empty stomach at fasting, with water, separated by at least 30 minutes from breakfast and other medications,  and separated by more than 4 hours from calcium, iron, multivitamins, acid reflux medications (PPIs). -Patient is made aware of the fact that thyroid hormone replacement is needed for life, dose to be adjusted by periodic monitoring of thyroid function tests.  4) Hypertension/BP- His blood pressure is controlled to target.  He is advised to continue Losartan 50 mg po daily.   I advised patient to maintain close follow up with his PCP for primary care needs.     I spent 43 minutes in the care of the patient today including review of labs from CMP, Lipids, Thyroid Function, Hematology (current and previous including abstractions from other facilities); face-to-face time discussing  his blood glucose readings/logs, discussing hypoglycemia and hyperglycemia episodes and symptoms, medications doses, his options of short and long term treatment based on the latest standards of care / guidelines;  discussion about incorporating  lifestyle medicine;  and documenting the encounter.    Please refer to Patient Instructions for Blood Glucose Monitoring and Insulin/Medications Dosing Guide"  in media tab for additional information. Please  also refer to " Patient Self Inventory" in the Media  tab for reviewed elements of pertinent patient history.  [name] participated in the discussions, expressed understanding, and voiced agreement with the above plans.  All questions were answered to his satisfaction. he is encouraged to contact clinic should he have any questions or concerns prior to his return visit.    Follow up plan: Return in about 4 months (around 07/14/2021) for Diabetes F/U with A1c in office, Bring meter and logs, No previsit labs.   Ronny Bacon, [facility] [address] [place], [place] [address] Phone: [phone] Fax: [phone]   03/14/2021, 2:31 PM

## 2021-03-22 DIAGNOSIS — M65332 Trigger finger, left middle finger: Secondary | ICD-10-CM | POA: Diagnosis not present

## 2021-03-22 DIAGNOSIS — G894 Chronic pain syndrome: Secondary | ICD-10-CM | POA: Diagnosis not present

## 2021-03-22 DIAGNOSIS — M5136 Other intervertebral disc degeneration, lumbar region: Secondary | ICD-10-CM | POA: Diagnosis not present

## 2021-04-09 DIAGNOSIS — Z794 Long term (current) use of insulin: Secondary | ICD-10-CM | POA: Diagnosis not present

## 2021-04-09 DIAGNOSIS — E1159 Type 2 diabetes mellitus with other circulatory complications: Secondary | ICD-10-CM | POA: Diagnosis not present

## 2021-04-09 DIAGNOSIS — E1122 Type 2 diabetes mellitus with diabetic chronic kidney disease: Secondary | ICD-10-CM | POA: Diagnosis not present

## 2021-04-12 DIAGNOSIS — G894 Chronic pain syndrome: Secondary | ICD-10-CM | POA: Diagnosis not present

## 2021-04-12 DIAGNOSIS — J449 Chronic obstructive pulmonary disease, unspecified: Secondary | ICD-10-CM | POA: Diagnosis not present

## 2021-04-12 DIAGNOSIS — I25119 Atherosclerotic heart disease of native coronary artery with unspecified angina pectoris: Secondary | ICD-10-CM | POA: Diagnosis not present

## 2021-04-12 DIAGNOSIS — I10 Essential (primary) hypertension: Secondary | ICD-10-CM | POA: Diagnosis not present

## 2021-04-12 DIAGNOSIS — E782 Mixed hyperlipidemia: Secondary | ICD-10-CM | POA: Diagnosis not present

## 2021-04-12 DIAGNOSIS — M1991 Primary osteoarthritis, unspecified site: Secondary | ICD-10-CM | POA: Diagnosis not present

## 2021-04-12 DIAGNOSIS — E1122 Type 2 diabetes mellitus with diabetic chronic kidney disease: Secondary | ICD-10-CM | POA: Diagnosis not present

## 2021-04-20 DIAGNOSIS — M5136 Other intervertebral disc degeneration, lumbar region: Secondary | ICD-10-CM | POA: Diagnosis not present

## 2021-04-20 DIAGNOSIS — M1991 Primary osteoarthritis, unspecified site: Secondary | ICD-10-CM | POA: Diagnosis not present

## 2021-04-20 DIAGNOSIS — G894 Chronic pain syndrome: Secondary | ICD-10-CM | POA: Diagnosis not present

## 2021-05-01 DIAGNOSIS — E1159 Type 2 diabetes mellitus with other circulatory complications: Secondary | ICD-10-CM | POA: Diagnosis not present

## 2021-05-01 DIAGNOSIS — Z794 Long term (current) use of insulin: Secondary | ICD-10-CM | POA: Diagnosis not present

## 2021-05-01 DIAGNOSIS — E1122 Type 2 diabetes mellitus with diabetic chronic kidney disease: Secondary | ICD-10-CM | POA: Diagnosis not present

## 2021-05-11 ENCOUNTER — Other Ambulatory Visit: Payer: Self-pay | Admitting: Nurse Practitioner

## 2021-05-18 DIAGNOSIS — I10 Essential (primary) hypertension: Secondary | ICD-10-CM | POA: Diagnosis not present

## 2021-05-18 DIAGNOSIS — G894 Chronic pain syndrome: Secondary | ICD-10-CM | POA: Diagnosis not present

## 2021-05-18 DIAGNOSIS — E1122 Type 2 diabetes mellitus with diabetic chronic kidney disease: Secondary | ICD-10-CM | POA: Diagnosis not present

## 2021-05-18 DIAGNOSIS — G720 Drug-induced myopathy: Secondary | ICD-10-CM | POA: Diagnosis not present

## 2021-05-18 DIAGNOSIS — Z23 Encounter for immunization: Secondary | ICD-10-CM | POA: Diagnosis not present

## 2021-05-18 DIAGNOSIS — E039 Hypothyroidism, unspecified: Secondary | ICD-10-CM | POA: Diagnosis not present

## 2021-05-18 DIAGNOSIS — I25119 Atherosclerotic heart disease of native coronary artery with unspecified angina pectoris: Secondary | ICD-10-CM | POA: Diagnosis not present

## 2021-05-18 DIAGNOSIS — E782 Mixed hyperlipidemia: Secondary | ICD-10-CM | POA: Diagnosis not present

## 2021-05-18 LAB — TSH: TSH: 2.33 (ref 0.41–5.90)

## 2021-06-01 DIAGNOSIS — Z794 Long term (current) use of insulin: Secondary | ICD-10-CM | POA: Diagnosis not present

## 2021-06-01 DIAGNOSIS — E1159 Type 2 diabetes mellitus with other circulatory complications: Secondary | ICD-10-CM | POA: Diagnosis not present

## 2021-06-01 DIAGNOSIS — E1122 Type 2 diabetes mellitus with diabetic chronic kidney disease: Secondary | ICD-10-CM | POA: Diagnosis not present

## 2021-06-19 DIAGNOSIS — G894 Chronic pain syndrome: Secondary | ICD-10-CM | POA: Diagnosis not present

## 2021-06-19 DIAGNOSIS — M5136 Other intervertebral disc degeneration, lumbar region: Secondary | ICD-10-CM | POA: Diagnosis not present

## 2021-06-19 DIAGNOSIS — M1991 Primary osteoarthritis, unspecified site: Secondary | ICD-10-CM | POA: Diagnosis not present

## 2021-06-19 DIAGNOSIS — J449 Chronic obstructive pulmonary disease, unspecified: Secondary | ICD-10-CM | POA: Diagnosis not present

## 2021-06-19 DIAGNOSIS — G4700 Insomnia, unspecified: Secondary | ICD-10-CM | POA: Diagnosis not present

## 2021-06-20 ENCOUNTER — Other Ambulatory Visit: Payer: Self-pay | Admitting: Cardiology

## 2021-06-20 DIAGNOSIS — E7800 Pure hypercholesterolemia, unspecified: Secondary | ICD-10-CM

## 2021-07-01 ENCOUNTER — Other Ambulatory Visit: Payer: Self-pay | Admitting: Nurse Practitioner

## 2021-07-02 DIAGNOSIS — E1122 Type 2 diabetes mellitus with diabetic chronic kidney disease: Secondary | ICD-10-CM | POA: Diagnosis not present

## 2021-07-02 DIAGNOSIS — E1159 Type 2 diabetes mellitus with other circulatory complications: Secondary | ICD-10-CM | POA: Diagnosis not present

## 2021-07-02 DIAGNOSIS — Z794 Long term (current) use of insulin: Secondary | ICD-10-CM | POA: Diagnosis not present

## 2021-07-03 ENCOUNTER — Telehealth: Payer: Self-pay | Admitting: Nurse Practitioner

## 2021-07-03 NOTE — Telephone Encounter (Signed)
Called patient and he stated that he has been giving 30 units sometimes and he is really stressed because he takes care of his mother. Patient does not have enough Humalog to last him until 07/08/2021 and he asked to have his appointment moved to tomorrow so we can adjust his insulin dosage if need be. Patient verbalized an understanding.

## 2021-07-03 NOTE — Telephone Encounter (Signed)
Patient said that the pharmacy is telling him it is too soon to get his Humalog. Can you call over to the pharmacy? [facility]

## 2021-07-04 ENCOUNTER — Ambulatory Visit (INDEPENDENT_AMBULATORY_CARE_PROVIDER_SITE_OTHER): Payer: Medicare Other | Admitting: Nurse Practitioner

## 2021-07-04 ENCOUNTER — Telehealth: Payer: Self-pay | Admitting: Nurse Practitioner

## 2021-07-04 ENCOUNTER — Other Ambulatory Visit: Payer: Self-pay

## 2021-07-04 ENCOUNTER — Encounter: Payer: Self-pay | Admitting: Nurse Practitioner

## 2021-07-04 VITALS — BP 121/73 | HR 96 | Ht 69.0 in | Wt 280.0 lb

## 2021-07-04 DIAGNOSIS — E559 Vitamin D deficiency, unspecified: Secondary | ICD-10-CM

## 2021-07-04 DIAGNOSIS — E1159 Type 2 diabetes mellitus with other circulatory complications: Secondary | ICD-10-CM

## 2021-07-04 DIAGNOSIS — I10 Essential (primary) hypertension: Secondary | ICD-10-CM | POA: Diagnosis not present

## 2021-07-04 DIAGNOSIS — E782 Mixed hyperlipidemia: Secondary | ICD-10-CM | POA: Diagnosis not present

## 2021-07-04 DIAGNOSIS — E1165 Type 2 diabetes mellitus with hyperglycemia: Secondary | ICD-10-CM | POA: Diagnosis not present

## 2021-07-04 DIAGNOSIS — E039 Hypothyroidism, unspecified: Secondary | ICD-10-CM

## 2021-07-04 LAB — POCT GLYCOSYLATED HEMOGLOBIN (HGB A1C): HbA1c, POC (controlled diabetic range): 8.2 % — AB (ref 0.0–7.0)

## 2021-07-04 MED ORDER — TOUJEO MAX SOLOSTAR 300 UNIT/ML ~~LOC~~ SOPN: 100.0000 [IU] | PEN_INJECTOR | Freq: Every evening | SUBCUTANEOUS | 3 refills | Status: DC

## 2021-07-04 MED ORDER — LANTUS SOLOSTAR 100 UNIT/ML ~~LOC~~ SOPN: 100.0000 [IU] | PEN_INJECTOR | Freq: Every day | SUBCUTANEOUS | 2 refills | Status: DC

## 2021-07-04 MED ORDER — INSULIN LISPRO (1 UNIT DIAL) 100 UNIT/ML (KWIKPEN): 20.0000 [IU] | PEN_INJECTOR | Freq: Three times a day (TID) | SUBCUTANEOUS | 3 refills | Status: DC

## 2021-07-04 NOTE — Progress Notes (Signed)
07/04/2021   Endocrinology follow-up note   Subjective:    Patient ID: [name], male    DOB: [birth year],    Past Medical History:  Diagnosis Date   ASCVD (arteriosclerotic cardiovascular disease)    -MI in 01/2001 prompted CABG; EF-30% at cath; 50% on echo in 7/02 and normal in 2005   Asthma    Cerebrovascular disease     L CEA 06/2004 following left renal embolism; 10/2008 plaque w/o focal stenosis   Chronic back pain    Chronic leg pain    Chronic obstructive pulmonary disease (HCC)    Degenerative joint disease     chronic LBP-s/p L3-4 fusion   Diabetes mellitus    -no insulin   Hyperlipidemia    Hypertension    Hypothyroidism    Obesity    Restless leg syndrome    Possible   Tobacco abuse, in remission    50 pack years; discontinued in 2002   Past Surgical History:  Procedure Laterality Date   CAROTID ENDARTERECTOMY  08/14/2003   Left   CATARACT EXTRACTION     bilateral   CHOLECYSTECTOMY N/A 02/06/2021   Procedure: LAPAROSCOPIC CHOLECYSTECTOMY;  Surgeon: Franky MachoJenkins, Mark, MD;  Location: [facility];  Service: General;  Laterality: N/A;   CORONARY ARTERY BYPASS GRAFT  08/13/2000   GALLBLADDER SURGERY  02/07/2021   LUMBAR SPINE SURGERY     L3-4 fusion   Social History   Socioeconomic History   Marital status: Divorced    Spouse name: Not on file   Number of children: 1   Years of education: Not on file   Highest education level: Not on file  Occupational History   Occupation: veteran    Comment: disabledf  Tobacco Use   Smoking status: Former    Packs/day: 1.00    Years: 50.00    Pack years: 50.00    Types: Cigarettes    Quit date: 01/15/2001    Years since quitting: 20.4   Smokeless tobacco: Never  Vaping Use   Vaping Use: Never used  Substance and Sexual Activity   Alcohol use: No   Drug use: No   Sexual activity: Not Currently  Other Topics Concern   Not on file  Social History Narrative   Not on file   Social Determinants of Health    Financial Resource Strain: Not on file  Food Insecurity: Not on file  Transportation Needs: Not on file  Physical Activity: Not on file  Stress: Not on file  Social Connections: Not on file   Outpatient Encounter Medications as of 07/04/2021  Medication Sig   albuterol (VENTOLIN HFA) 108 (90 Base) MCG/ACT inhaler Inhale 1-2 puffs into the lungs every 6 (six) hours as needed for shortness of breath or wheezing.   albuterol-ipratropium (COMBIVENT) 18-103 MCG/ACT inhaler Inhale 2 puffs into the lungs every 6 (six) hours as needed for wheezing or shortness of breath.    Ascorbic Acid (VITAMIN C) 1000 MG tablet Take 2,000 mg by mouth daily.   aspirin 81 MG tablet Take 81 mg by mouth daily.     B Complex-Biotin-FA (VITAMIN B50 COMPLEX PO) Take 1 capsule by mouth daily.   Cholecalciferol (VITAMIN D PO) Take 1 capsule by mouth daily.   Continuous Blood Gluc Sensor (FREESTYLE LIBRE 14 DAY SENSOR) MISC Inject 1 each into the skin every 14 (fourteen) days. Use as directed.   Cyanocobalamin ([employer] B-12) 1000 MCG/15ML LIQD Place 15 mLs under the tongue 2 (two) times a  week.   cyclobenzaprine (FLEXERIL) 10 MG tablet Take 10 mg by mouth 3 (three) times daily as needed for muscle spasms.   docusate sodium (COLACE) 100 MG capsule Take 200 mg by mouth daily.   escitalopram (LEXAPRO) 20 MG tablet Take 20 mg by mouth at bedtime.   fenofibrate 160 MG tablet Take 160 mg by mouth daily.   fish oil-omega-3 fatty acids 1000 MG capsule Take 2 g by mouth daily.   gabapentin (NEURONTIN) 300 MG capsule Take 300 mg by mouth at bedtime.   glipiZIDE (GLUCOTROL XL) 10 MG 24 hr tablet Take 1 tablet (10 mg total) by mouth daily with breakfast.   glucose blood (ACCU-CHEK AVIVA PLUS) test strip Use as directed to check blood glucose four times daily   HYDROmorphone (DILAUDID) 4 MG tablet Take 4 mg by mouth 3 (three) times daily.   levothyroxine (SYNTHROID) 175 MCG tablet Take 1 tablet (175 mcg total) by mouth daily before  breakfast.   Multiple Vitamins-Minerals (MULTIVITAMIN WITH MINERALS) tablet Take 1 tablet by mouth daily.     nitroGLYCERIN (NITROSTAT) 0.4 MG SL tablet Place 0.4 mg under the tongue every 5 (five) minutes as needed for chest pain.   rosuvastatin (CRESTOR) 40 MG tablet TAKE ONE TABLET BY MOUTH ONCE DAILY.   SURE COMFORT PEN NEEDLES 31G X 8 MM MISC USE AS DIRECTED WITH LANTUS AND NOVOLOG UP TO FOUR TIMES DAILY.   [DISCONTINUED] insulin glargine (LANTUS SOLOSTAR) 100 UNIT/ML Solostar Pen Inject 90 Units into the skin at bedtime.   [DISCONTINUED] insulin lispro (HUMALOG) 100 UNIT/ML KwikPen INJECT 20 TO 26 UNITS INTO THE SKIN 3 TIMES DAILY BEFORE MEALS.   [DISCONTINUED] sitaGLIPtin (JANUVIA) 50 MG tablet Take 1 tablet (50 mg total) by mouth daily.   insulin glargine (LANTUS SOLOSTAR) 100 UNIT/ML Solostar Pen Inject 100 Units into the skin at bedtime.   insulin lispro (HUMALOG) 100 UNIT/ML KwikPen Inject 20-26 Units into the skin 3 (three) times daily.   losartan (COZAAR) 50 MG tablet Take 1 tablet (50 mg total) by mouth daily.   [DISCONTINUED] ondansetron (ZOFRAN) 4 MG tablet Take 1 tablet (4 mg total) by mouth every 6 (six) hours as needed for nausea or vomiting. (Patient not taking: Reported on 03/14/2021)   [DISCONTINUED] pantoprazole (PROTONIX) 40 MG tablet Take 1 tablet (40 mg total) by mouth daily. (Patient not taking: Reported on 03/14/2021)   No facility-administered encounter medications on file as of 07/04/2021.   ALLERGIES: Allergies  Allergen Reactions   Alprazolam Nausea And Vomiting and Other (See Comments)    Altered mental status   Atorvastatin Nausea And Vomiting   Gemfibrozil     unknown   Invokana [Canagliflozin] Diarrhea and Nausea And Vomiting   Definity [Perflutren Lipid Microsphere] Other (See Comments)    Headache   VACCINATION STATUS: Immunization History  Administered Date(s) Administered   Influenza-Unspecified 05/13/2014    Diabetes He presents for his  follow-up diabetic visit. He has type 2 diabetes mellitus. Onset time: He was diagnosed at approximate age of 40 years. His disease course has been worsening. Hypoglycemia symptoms include tremors. Pertinent negatives for hypoglycemia include no confusion, headaches, nervousness/anxiousness, pallor or sweats. (rare) Associated symptoms include foot paresthesias. Pertinent negatives for diabetes include no fatigue, no polydipsia, no polyphagia, no polyuria and no weight loss. There are no hypoglycemic complications. Symptoms are stable. Diabetic complications include a CVA, heart disease, nephropathy and peripheral neuropathy. Risk factors for coronary artery disease include diabetes mellitus, dyslipidemia, hypertension, male sex, tobacco exposure, sedentary  lifestyle and obesity. Current diabetic treatment includes oral agent (dual therapy) and intensive insulin program. He is compliant with treatment most of the time. His weight is increasing rapidly. He is following a generally unhealthy diet. When asked about meal planning, he reported none. He has had a previous visit with a dietitian. He participates in exercise intermittently. His home blood glucose trend is increasing steadily. His breakfast blood glucose range is generally 180-200 mg/dl. His lunch blood glucose range is generally 180-200 mg/dl. His dinner blood glucose range is generally 180-200 mg/dl. His bedtime blood glucose range is generally 180-200 mg/dl. His overall blood glucose range is 180-200 mg/dl. (He presents today with his CGM, no logs, showing persistently high glycemic profile both fasting and postprandially.  His POCT A1c today is 8.2%, increasing from last visit of 7.6%.  He admits he has not been as active nor eaten as healthy as he should lately.  Analysis of his CGM shows TIR 40%, TAR 58%, TBR 2%.   ) An ACE inhibitor/angiotensin II receptor blocker is being taken. He does not see a podiatrist.Eye exam is current.   Hyperlipidemia This is a chronic problem. The current episode started more than 1 year ago. The problem is uncontrolled. Recent lipid tests were reviewed and are variable. Exacerbating diseases include diabetes, hypothyroidism and obesity. He has no history of chronic renal disease. There are no known factors aggravating his hyperlipidemia. Associated symptoms include myalgias. Current antihyperlipidemic treatment includes statins. The current treatment provides moderate improvement of lipids. There are no compliance problems.  Risk factors for coronary artery disease include family history, dyslipidemia, diabetes mellitus, hypertension, male sex, a sedentary lifestyle and obesity.  Thyroid Problem Presents for follow-up (He has longstanding hypothyroidism, currently on levothyroxine 150 mcg p.o. daily before breakfast.  He reports compliance with medication.) visit. Symptoms include tremors. Patient reports no anxiety, cold intolerance, constipation, depressed mood, diarrhea, fatigue, heat intolerance, palpitations, weight gain or weight loss. The symptoms have been stable. His past medical history is significant for diabetes and hyperlipidemia.    Review of systems  Constitutional: + rapidly increasing body weight,  current Body mass index is 41.35 kg/m. , no fatigue, no subjective hyperthermia, no subjective hypothermia Eyes: no blurry vision, no xerophthalmia ENT: no sore throat, no nodules palpated in throat, no dysphagia/odynophagia, no hoarseness Cardiovascular: no chest pain, no shortness of breath, no palpitations, mild leg swelling Respiratory: no cough, no shortness of breath Gastrointestinal: no nausea/vomiting/diarrhea Musculoskeletal: no muscle/joint aches Skin: no rashes, no hyperemia Neurological: no tremors, no numbness, no tingling, no dizziness Psychiatric: no depression, no anxiety, reports more stress lately-takes care of his mother   Objective:    BP 121/73   Pulse 96    Ht 5\' 9"  (1.753 m)   Wt 280 lb (127 kg)   BMI 41.35 kg/m   Wt Readings from Last 3 Encounters:  07/04/21 280 lb (127 kg)  03/14/21 264 lb 9.6 oz (120 kg)  02/03/21 266 lb (120.7 kg)   BP Readings from Last 3 Encounters:  07/04/21 121/73  03/14/21 137/82  02/06/21 (!) 136/93   Physical Exam- Limited  Constitutional:  Body mass index is 41.35 kg/m. , not in acute distress, normal state of mind, ? Forgetful? Eyes:  EOMI, no exophthalmos Neck: Supple Cardiovascular: RRR, no murmurs, rubs, or gallops, nonpitting edema to BLE Respiratory: Adequate breathing efforts, no crackles, rales, rhonchi, or wheezing Musculoskeletal: no gross deformities, strength intact in all four extremities, no gross restriction of joint movements Skin:  no rashes, no hyperemia Neurological: no tremor with outstretched hands    Results for orders placed or performed in visit on 07/04/21  POCT glycosylated hemoglobin (Hb A1C)  Result Value Ref Range   Hemoglobin A1C     HbA1c POC (<> result, manual entry)     HbA1c, POC (prediabetic range)     HbA1c, POC (controlled diabetic range) 8.2 (A) 0.0 - 7.0 %   Lab Results  Component Value Date   WBC 11.9 (H) 01/31/2021   HGB 14.1 01/31/2021   HCT 43.5 01/31/2021   MCV 91.6 01/31/2021   PLT 227 01/31/2021    Lab Results  Component Value Date   HGBA1C 8.2 (A) 07/04/2021   HGBA1C 8.6 (A) 11/09/2020   HGBA1C 8.0 (A) 07/11/2020     Lipid Panel     Component Value Date/Time   CHOL 151 10/03/2020 0941   TRIG 225 (H) 10/03/2020 0941   TRIG 329 10/02/2007 0000   HDL 41 10/03/2020 0941   CHOLHDL 3.7 10/03/2020 0941   CHOLHDL 4.9 02/26/2020 1002   VLDL 19 09/09/2018 0949   LDLCALC 73 10/03/2020 0941   LDLCALC 136 (H) 02/26/2020 1002   LDLCALC 66 10/02/2007 0000     Assessment & Plan:   1) Type 2 diabetes mellitus with stage 3 chronic kidney disease, with long-term current use of insulin   He presents today with his CGM, no logs, showing  persistently high glycemic profile both fasting and postprandially.  His POCT A1c today is 8.2%, increasing from last visit of 7.6%.  He admits he has not been as active nor eaten as healthy as he should lately.  Analysis of his CGM shows TIR 40%, TAR 58%, TBR 2%.     Recent labs reviewed.  - His diabetes is complicated by CAD, CKD, CVA, Retinopathy. Patient remains at a high risk for more acute and chronic complications of diabetes which include CAD, CVA, CKD, retinopathy, and neuropathy. These are all discussed in detail with the patient.  - Nutritional counseling repeated at each appointment due to patients tendency to fall back in to old habits.  - The patient admits there is a room for improvement in their diet and drink choices. -  Suggestion is made for the patient to avoid simple carbohydrates from their diet including Cakes, Sweet Desserts / Pastries, Ice Cream, Soda (diet and regular), Sweet Tea, Candies, Chips, Cookies, Sweet Pastries, Store Bought Juices, Alcohol in Excess of 1-2 drinks a day, Artificial Sweeteners, Coffee Creamer, and "Sugar-free" Products. This will help patient to have stable blood glucose profile and potentially avoid unintended weight gain.   - I encouraged the patient to switch to unprocessed or minimally processed complex starch and increased protein intake (animal or plant source), fruits, and vegetables.   - Patient is advised to stick to a routine mealtimes to eat 3 meals a day and avoid unnecessary snacks (to snack only to correct hypoglycemia).  -He will continue to require intensive treatment with basal/bolus insulin in order for him to achieve and maintain control of diabetes to target.    -We discussed adjusting his lifestyle more to achieve better control of his diabetes.  He is advised to stay on same dose of Lantus 100 units SQ nightly, continue Humalog 20-26 units TID with meals if glucose is above 90 and he is eating (Specific instructions on how to  titrate insulin dosage based on glucose readings given to patient in writing), continue Glipizide 10 mg XL daily with breakfast.  He could benefit from weight loss, therefore I discontinued his Januvia today and started him on Ozempic 0.25 mg SQ weekly x 2 weeks, then advance to 0.5 mg SQ weekly thereafter if tolerated well.  -He is encouraged to continue using his CGM to monitor glucose 4 times daily, before meals and before bed, and to call the clinic if he has readings less than 70 or greater than 200 for 3 tests in a row.  -Due to CKD patient is not a candidate for Metformin , SGLT2i.  -Target numbers for A1c, LDL, HDL, Triglycerides, Waist Circumference were discussed in detail.  2) Lipids/HPL:  His recent lipid panel from 10/03/20 shows controlled LDL of 73 and elevated triglycerides of 225.  He is advised to continue Crestor 5 mg p.o. daily at bedtime, omega-3 fatty acid supplement, and fenofibrate 160 mg p.o. daily.  Side effects and precautions discussed with him.  3) Hypothyroidism:   -He does not have recent TFTs to review.  He is advised to continue his Levothyroxine 175 mcg po daily before breakfast.  He recently had labs drawn by his PCP- requested patient have them send [place] a copy.   - We discussed about the correct intake of his thyroid hormone, on empty stomach at fasting, with water, separated by at least 30 minutes from breakfast and other medications,  and separated by more than 4 hours from calcium, iron, multivitamins, acid reflux medications (PPIs). -Patient is made aware of the fact that thyroid hormone replacement is needed for life, dose to be adjusted by periodic monitoring of thyroid function tests.  4) Hypertension/BP- His blood pressure is controlled to target.  He is advised to continue Losartan 50 mg po daily.   I advised patient to maintain close follow up with his PCP for primary care needs.     I spent 40 minutes in the care of the patient today including  review of labs from [place], Lipids, Thyroid Function, Hematology (current and previous including abstractions from other facilities); face-to-face time discussing  his blood glucose readings/logs, discussing hypoglycemia and hyperglycemia episodes and symptoms, medications doses, his options of short and long term treatment based on the latest standards of care / guidelines;  discussion about incorporating lifestyle medicine;  and documenting the encounter.    Please refer to Patient Instructions for Blood Glucose Monitoring and Insulin/Medications Dosing Guide"  in media tab for additional information. Please  also refer to " Patient Self Inventory" in the Media  tab for reviewed elements of pertinent patient history.  Hettie Holstein participated in the discussions, expressed understanding, and voiced agreement with the above plans.  All questions were answered to his satisfaction. he is encouraged to contact clinic should he have any questions or concerns prior to his return visit.    Follow up plan: Return in about 3 months (around 10/04/2021) for Diabetes F/U- A1c and UM in office, Previsit labs, Bring meter and logs.   Rayetta Pigg, [facility] [address] [place], [place] [address] Phone: [phone] Fax: [phone]   07/04/2021, 2:06 PM

## 2021-07-04 NOTE — Telephone Encounter (Signed)
Called the patient and he is still confused about the Humalog. Patient stated that he uses the Lantus and not Toujeo. Please advise.

## 2021-07-04 NOTE — Telephone Encounter (Signed)
Would you be able to help this patient? I explained to him and he is still confused.

## 2021-07-04 NOTE — Telephone Encounter (Signed)
Tell him to use the Lantus that he has and forget about the sample Tresiba (throw it away) pen I gave him since it is confusing him.

## 2021-07-04 NOTE — Telephone Encounter (Signed)
Pt was seen today and left a voicemail stating he is still confused about how he needs to be taking his medication and requesting call back.

## 2021-07-04 NOTE — Patient Instructions (Signed)

## 2021-07-04 NOTE — Telephone Encounter (Signed)
I switched him to the [facility] Max to allow for [place] to titrate his basal insulin more on subsequent visits if we cannot gain control of his diabetes with the Lantus 100 units (he is having to split into 2 doses at night due to pen capabilities).  We discussed this during the visit but he did seem confused.  He should only be injecting the Humalog TID with meals.  He should never take Humalog before bed for safety purposes.  I did advise the patient to bring all of his medications to the next visit so we can look at them together to help eliminate any confusion.

## 2021-07-11 NOTE — Telephone Encounter (Signed)
Have you already spoke with this pt? If so I will go ahead and close out the encounter its still in my box.

## 2021-07-15 ENCOUNTER — Other Ambulatory Visit: Payer: Self-pay | Admitting: "Endocrinology

## 2021-07-17 ENCOUNTER — Ambulatory Visit: Payer: Medicare Other | Admitting: Nurse Practitioner

## 2021-07-17 NOTE — Telephone Encounter (Signed)
Yes, I have spoken to patient and he is aware.

## 2021-07-19 DIAGNOSIS — M5136 Other intervertebral disc degeneration, lumbar region: Secondary | ICD-10-CM | POA: Diagnosis not present

## 2021-07-19 DIAGNOSIS — G894 Chronic pain syndrome: Secondary | ICD-10-CM | POA: Diagnosis not present

## 2021-07-19 DIAGNOSIS — M1991 Primary osteoarthritis, unspecified site: Secondary | ICD-10-CM | POA: Diagnosis not present

## 2021-08-02 DIAGNOSIS — E1122 Type 2 diabetes mellitus with diabetic chronic kidney disease: Secondary | ICD-10-CM | POA: Diagnosis not present

## 2021-08-02 DIAGNOSIS — E1159 Type 2 diabetes mellitus with other circulatory complications: Secondary | ICD-10-CM | POA: Diagnosis not present

## 2021-08-02 DIAGNOSIS — Z794 Long term (current) use of insulin: Secondary | ICD-10-CM | POA: Diagnosis not present

## 2021-08-17 DIAGNOSIS — G894 Chronic pain syndrome: Secondary | ICD-10-CM | POA: Diagnosis not present

## 2021-08-17 DIAGNOSIS — G720 Drug-induced myopathy: Secondary | ICD-10-CM | POA: Diagnosis not present

## 2021-08-17 DIAGNOSIS — M5136 Other intervertebral disc degeneration, lumbar region: Secondary | ICD-10-CM | POA: Diagnosis not present

## 2021-08-17 DIAGNOSIS — M1991 Primary osteoarthritis, unspecified site: Secondary | ICD-10-CM | POA: Diagnosis not present

## 2021-08-22 ENCOUNTER — Other Ambulatory Visit: Payer: Self-pay | Admitting: Nurse Practitioner

## 2021-09-01 ENCOUNTER — Other Ambulatory Visit: Payer: Self-pay | Admitting: Cardiology

## 2021-09-01 ENCOUNTER — Other Ambulatory Visit: Payer: Self-pay | Admitting: Nurse Practitioner

## 2021-09-01 DIAGNOSIS — I10 Essential (primary) hypertension: Secondary | ICD-10-CM

## 2021-09-02 DIAGNOSIS — Z794 Long term (current) use of insulin: Secondary | ICD-10-CM | POA: Diagnosis not present

## 2021-09-02 DIAGNOSIS — E1159 Type 2 diabetes mellitus with other circulatory complications: Secondary | ICD-10-CM | POA: Diagnosis not present

## 2021-09-02 DIAGNOSIS — E1122 Type 2 diabetes mellitus with diabetic chronic kidney disease: Secondary | ICD-10-CM | POA: Diagnosis not present

## 2021-09-14 DIAGNOSIS — M5136 Other intervertebral disc degeneration, lumbar region: Secondary | ICD-10-CM | POA: Diagnosis not present

## 2021-09-14 DIAGNOSIS — S161XXA Strain of muscle, fascia and tendon at neck level, initial encounter: Secondary | ICD-10-CM | POA: Diagnosis not present

## 2021-09-14 DIAGNOSIS — M1991 Primary osteoarthritis, unspecified site: Secondary | ICD-10-CM | POA: Diagnosis not present

## 2021-09-14 DIAGNOSIS — G894 Chronic pain syndrome: Secondary | ICD-10-CM | POA: Diagnosis not present

## 2021-10-02 DIAGNOSIS — E039 Hypothyroidism, unspecified: Secondary | ICD-10-CM | POA: Diagnosis not present

## 2021-10-02 DIAGNOSIS — E1159 Type 2 diabetes mellitus with other circulatory complications: Secondary | ICD-10-CM | POA: Diagnosis not present

## 2021-10-02 DIAGNOSIS — E559 Vitamin D deficiency, unspecified: Secondary | ICD-10-CM | POA: Diagnosis not present

## 2021-10-03 DIAGNOSIS — Z794 Long term (current) use of insulin: Secondary | ICD-10-CM | POA: Diagnosis not present

## 2021-10-03 DIAGNOSIS — E1122 Type 2 diabetes mellitus with diabetic chronic kidney disease: Secondary | ICD-10-CM | POA: Diagnosis not present

## 2021-10-03 DIAGNOSIS — E1159 Type 2 diabetes mellitus with other circulatory complications: Secondary | ICD-10-CM | POA: Diagnosis not present

## 2021-10-03 LAB — COMPREHENSIVE METABOLIC PANEL
ALT: 50 IU/L — ABNORMAL HIGH (ref 0–44)
AST: 40 IU/L (ref 0–40)
Albumin/Globulin Ratio: 1.6 (ref 1.2–2.2)
Albumin: 4.8 g/dL (ref 3.8–4.8)
Alkaline Phosphatase: 78 IU/L (ref 44–121)
BUN/Creatinine Ratio: 19 (ref 10–24)
BUN: 24 mg/dL (ref 8–27)
Bilirubin Total: 0.6 mg/dL (ref 0.0–1.2)
CO2: 19 mmol/L — ABNORMAL LOW (ref 20–29)
Calcium: 11.5 mg/dL — ABNORMAL HIGH (ref 8.6–10.2)
Chloride: 97 mmol/L (ref 96–106)
Creatinine, Ser: 1.29 mg/dL — ABNORMAL HIGH (ref 0.76–1.27)
Globulin, Total: 3 g/dL (ref 1.5–4.5)
Glucose: 248 mg/dL — ABNORMAL HIGH (ref 70–99)
Potassium: 4.9 mmol/L (ref 3.5–5.2)
Sodium: 136 mmol/L (ref 134–144)
Total Protein: 7.8 g/dL (ref 6.0–8.5)
eGFR: 62 mL/min/{1.73_m2} (ref >59)

## 2021-10-03 LAB — TSH: TSH: 6.17 u[IU]/mL — ABNORMAL HIGH (ref 0.450–4.500)

## 2021-10-03 LAB — VITAMIN D 25 HYDROXY (VIT D DEFICIENCY, FRACTURES): Vit D, 25-Hydroxy: 22.2 ng/mL — ABNORMAL LOW (ref 30.0–100.0)

## 2021-10-03 LAB — LIPID PANEL
Chol/HDL Ratio: 4.5 ratio (ref 0.0–5.0)
Cholesterol, Total: 172 mg/dL (ref 100–199)
HDL: 38 mg/dL — ABNORMAL LOW (ref >39)
LDL Chol Calc (NIH): 85 mg/dL (ref 0–99)
Triglycerides: 295 mg/dL — ABNORMAL HIGH (ref 0–149)
VLDL Cholesterol Cal: 49 mg/dL — ABNORMAL HIGH (ref 5–40)

## 2021-10-03 LAB — T4, FREE: Free T4: 0.92 ng/dL (ref 0.82–1.77)

## 2021-10-04 ENCOUNTER — Other Ambulatory Visit: Payer: Self-pay

## 2021-10-04 ENCOUNTER — Encounter: Payer: Self-pay | Admitting: Nurse Practitioner

## 2021-10-04 ENCOUNTER — Ambulatory Visit (INDEPENDENT_AMBULATORY_CARE_PROVIDER_SITE_OTHER): Payer: Medicare Other | Admitting: Nurse Practitioner

## 2021-10-04 VITALS — BP 107/72 | HR 94 | Ht 69.0 in | Wt 272.6 lb

## 2021-10-04 DIAGNOSIS — E039 Hypothyroidism, unspecified: Secondary | ICD-10-CM

## 2021-10-04 DIAGNOSIS — I10 Essential (primary) hypertension: Secondary | ICD-10-CM | POA: Diagnosis not present

## 2021-10-04 DIAGNOSIS — E559 Vitamin D deficiency, unspecified: Secondary | ICD-10-CM

## 2021-10-04 DIAGNOSIS — E1159 Type 2 diabetes mellitus with other circulatory complications: Secondary | ICD-10-CM | POA: Diagnosis not present

## 2021-10-04 DIAGNOSIS — E782 Mixed hyperlipidemia: Secondary | ICD-10-CM | POA: Diagnosis not present

## 2021-10-04 LAB — POCT GLYCOSYLATED HEMOGLOBIN (HGB A1C): HbA1c, POC (controlled diabetic range): 8.6 % — AB (ref 0.0–7.0)

## 2021-10-04 MED ORDER — VITAMIN D (ERGOCALCIFEROL) 1.25 MG (50000 UNIT) PO CAPS: 50000.0000 [IU] | ORAL_CAPSULE | ORAL | 0 refills | Status: DC

## 2021-10-04 MED ORDER — OZEMPIC (0.25 OR 0.5 MG/DOSE) 2 MG/1.5ML ~~LOC~~ SOPN: 0.5000 mg | PEN_INJECTOR | SUBCUTANEOUS | 3 refills | Status: DC

## 2021-10-04 MED ORDER — GLIPIZIDE ER 10 MG PO TB24: 10.0000 mg | ORAL_TABLET | Freq: Every day | ORAL | 3 refills | Status: DC

## 2021-10-04 NOTE — Progress Notes (Signed)
10/04/2021   Endocrinology follow-up note   Subjective:    Patient ID: [name], male    DOB: 1956-02-05,    Past Medical History:  Diagnosis Date   ASCVD (arteriosclerotic cardiovascular disease)    -MI in 01/2001 prompted CABG; EF-30% at cath; 50% on echo in 7/02 and normal in 2005   Asthma    Cerebrovascular disease     L CEA 06/2004 following left renal embolism; 10/2008 plaque w/o focal stenosis   Chronic back pain    Chronic leg pain    Chronic obstructive pulmonary disease (HCC)    Degenerative joint disease     chronic LBP-s/p L3-4 fusion   Diabetes mellitus    -no insulin   Hyperlipidemia    Hypertension    Hypothyroidism    Obesity    Restless leg syndrome    Possible   Tobacco abuse, in remission    50 pack years; discontinued in 2002   Past Surgical History:  Procedure Laterality Date   CAROTID ENDARTERECTOMY  08/14/2003   Left   CATARACT EXTRACTION     bilateral   CHOLECYSTECTOMY N/A 02/06/2021   Procedure: LAPAROSCOPIC CHOLECYSTECTOMY;  Surgeon: Franky Macho, MD;  Location: [facility];  Service: General;  Laterality: N/A;   CORONARY ARTERY BYPASS GRAFT  08/13/2000   GALLBLADDER SURGERY  02/07/2021   LUMBAR SPINE SURGERY     L3-4 fusion   Social History   Socioeconomic History   Marital status: Divorced    Spouse name: Not on file   Number of children: 1   Years of education: Not on file   Highest education level: Not on file  Occupational History   Occupation: veteran    Comment: disabledf  Tobacco Use   Smoking status: Former    Packs/day: 1.00    Years: 50.00    Pack years: 50.00    Types: Cigarettes    Quit date: 01/15/2001    Years since quitting: 20.7   Smokeless tobacco: Never  Vaping Use   Vaping Use: Never used  Substance and Sexual Activity   Alcohol use: No   Drug use: No   Sexual activity: Not Currently  Other Topics Concern   Not on file  Social History Narrative   Not on file   Social Determinants of Health    Financial Resource Strain: Not on file  Food Insecurity: Not on file  Transportation Needs: Not on file  Physical Activity: Not on file  Stress: Not on file  Social Connections: Not on file   Outpatient Encounter Medications as of 10/04/2021  Medication Sig   albuterol (VENTOLIN HFA) 108 (90 Base) MCG/ACT inhaler Inhale 1-2 puffs into the lungs every 6 (six) hours as needed for shortness of breath or wheezing.   albuterol-ipratropium (COMBIVENT) 18-103 MCG/ACT inhaler Inhale 2 puffs into the lungs every 6 (six) hours as needed for wheezing or shortness of breath.    Ascorbic Acid (VITAMIN C) 1000 MG tablet Take 2,000 mg by mouth daily.   aspirin 81 MG tablet Take 81 mg by mouth daily.     B Complex-Biotin-FA (VITAMIN B50 COMPLEX PO) Take 1 capsule by mouth daily.   Cholecalciferol (VITAMIN D PO) Take 1 capsule by mouth daily.   Continuous Blood Gluc Sensor (FREESTYLE LIBRE 14 DAY SENSOR) MISC Inject 1 each into the skin every 14 (fourteen) days. Use as directed.   Cyanocobalamin ([employer] B-12) 1000 MCG/15ML LIQD Place 15 mLs under the tongue 2 (two) times a  week.   cyclobenzaprine (FLEXERIL) 10 MG tablet Take 10 mg by mouth 3 (three) times daily as needed for muscle spasms.   docusate sodium (COLACE) 100 MG capsule Take 200 mg by mouth daily.   fenofibrate 160 MG tablet Take 160 mg by mouth daily.   fish oil-omega-3 fatty acids 1000 MG capsule Take 2 g by mouth daily.   gabapentin (NEURONTIN) 300 MG capsule Take 300 mg by mouth at bedtime.   glucose blood (ACCU-CHEK AVIVA PLUS) test strip Use as directed to check blood glucose four times daily   HYDROmorphone (DILAUDID) 4 MG tablet Take 4 mg by mouth 3 (three) times daily.   insulin glargine, 2 Unit Dial, (TOUJEO MAX SOLOSTAR) 300 UNIT/ML Solostar Pen Inject 100 Units into the skin at bedtime.   insulin lispro (HUMALOG) 100 UNIT/ML KwikPen Inject 20-26 Units into the skin 3 (three) times daily.   levothyroxine (SYNTHROID) 175 MCG tablet TAKE  1 TABLET DAILY BEFORE BREAKFAST.   losartan (COZAAR) 50 MG tablet TAKE ONE TABLET BY MOUTH ONCE DAILY.   Multiple Vitamins-Minerals (MULTIVITAMIN WITH MINERALS) tablet Take 1 tablet by mouth daily.     nitroGLYCERIN (NITROSTAT) 0.4 MG SL tablet Place 0.4 mg under the tongue every 5 (five) minutes as needed for chest pain.   rosuvastatin (CRESTOR) 40 MG tablet TAKE ONE TABLET BY MOUTH ONCE DAILY.   Semaglutide,0.25 or 0.5MG /DOS, (OZEMPIC, 0.25 OR 0.5 MG/DOSE,) 2 MG/1.5ML SOPN Inject 0.5 mg into the skin once a week.   SURE COMFORT PEN NEEDLES 31G X 8 MM MISC USE AS DIRECTED WITH LANTUS AND NOVOLOG UP TO FOUR TIMES DAILY.   Vitamin D, Ergocalciferol, (DRISDOL) 1.25 MG (50000 UNIT) CAPS capsule Take 1 capsule (50,000 Units total) by mouth every 7 (seven) days.   [DISCONTINUED] glipiZIDE (GLUCOTROL XL) 10 MG 24 hr tablet Take 1 tablet (10 mg total) by mouth daily with breakfast.   glipiZIDE (GLUCOTROL XL) 10 MG 24 hr tablet Take 1 tablet (10 mg total) by mouth daily with breakfast.   [DISCONTINUED] escitalopram (LEXAPRO) 20 MG tablet Take 20 mg by mouth at bedtime. (Patient not taking: Reported on 10/04/2021)   No facility-administered encounter medications on file as of 10/04/2021.   ALLERGIES: Allergies  Allergen Reactions   Alprazolam Nausea And Vomiting and Other (See Comments)    Altered mental status   Atorvastatin Nausea And Vomiting   Gemfibrozil     unknown   Invokana [Canagliflozin] Diarrhea and Nausea And Vomiting   Definity [Perflutren Lipid Microsphere] Other (See Comments)    Headache   VACCINATION STATUS: Immunization History  Administered Date(s) Administered   Influenza-Unspecified 05/13/2014    Diabetes He presents for his follow-up diabetic visit. He has type 2 diabetes mellitus. Onset time: He was diagnosed at approximate age of 75 years. His disease course has been improving. There are no hypoglycemic associated symptoms. Pertinent negatives for hypoglycemia include no  confusion, headaches, nervousness/anxiousness, pallor or sweats. Associated symptoms include foot paresthesias. Pertinent negatives for diabetes include no fatigue, no polydipsia, no polyphagia, no polyuria and no weight loss. There are no hypoglycemic complications. Symptoms are stable. Diabetic complications include a CVA, heart disease, nephropathy and peripheral neuropathy. Risk factors for coronary artery disease include diabetes mellitus, dyslipidemia, hypertension, male sex, tobacco exposure, sedentary lifestyle and obesity. Current diabetic treatment includes intensive insulin program and oral agent (monotherapy) (and Ozempic). He is compliant with treatment most of the time. His weight is fluctuating minimally. He is following a generally unhealthy diet. When asked about  meal planning, he reported none. He has had a previous visit with a dietitian. He participates in exercise intermittently. His home blood glucose trend is fluctuating minimally. (He presents today with his CGM, no logs, showing above target glycemic profile overall.  His POCT A1c today is 8.6%, increasing from last visit of 8.2%.  Analysis of his CGM shows TIR 28%, TAR 72%, TBR 0% with a GMI of 8.6%.  He does admit to drinking some soda, although in small quantities, and has not been exercising optimally during the winter months.  He ran out of his Ozempic between visits, says he could not get a refill. ) An ACE inhibitor/angiotensin II receptor blocker is being taken. He does not see a podiatrist.Eye exam is current.  Hyperlipidemia This is a chronic problem. The current episode started more than 1 year ago. The problem is uncontrolled. Recent lipid tests were reviewed and are variable. Exacerbating diseases include diabetes, hypothyroidism and obesity. He has no history of chronic renal disease. Factors aggravating his hyperlipidemia include fatty foods. Associated symptoms include myalgias. Current antihyperlipidemic treatment  includes statins. The current treatment provides moderate improvement of lipids. Compliance problems include adherence to diet and adherence to exercise.  Risk factors for coronary artery disease include family history, dyslipidemia, diabetes mellitus, hypertension, male sex, a sedentary lifestyle and obesity.  Thyroid Problem Presents for follow-up (He has longstanding hypothyroidism, currently on levothyroxine 150 mcg p.o. daily before breakfast.  He reports compliance with medication.) visit. Patient reports no anxiety, cold intolerance, constipation, depressed mood, diarrhea, fatigue, heat intolerance, palpitations, weight gain or weight loss. The symptoms have been stable. His past medical history is significant for diabetes and hyperlipidemia.    Review of systems  Constitutional: + steadily decreasing body weight,  current Body mass index is 40.26 kg/m. , no fatigue, no subjective hyperthermia, no subjective hypothermia Eyes: no blurry vision, no xerophthalmia ENT: no sore throat, no nodules palpated in throat, no dysphagia/odynophagia, no hoarseness Cardiovascular: no chest pain, no shortness of breath, no palpitations, no leg swelling Respiratory: no cough, no shortness of breath Gastrointestinal: no nausea/vomiting/diarrhea Musculoskeletal: diffuse muscle/joint aches Skin: no rashes, no hyperemia Neurological: no tremors, no numbness, no tingling, no dizziness Psychiatric: no depression, no anxiety   Objective:    BP 107/72    Pulse 94    Ht 5\' 9"  (1.753 m)    Wt 272 lb 9.6 oz (123.7 kg)    SpO2 96%    BMI 40.26 kg/m   Wt Readings from Last 3 Encounters:  10/04/21 272 lb 9.6 oz (123.7 kg)  07/04/21 280 lb (127 kg)  03/14/21 264 lb 9.6 oz (120 kg)   BP Readings from Last 3 Encounters:  10/04/21 107/72  07/04/21 121/73  03/14/21 137/82    Physical Exam- Limited  Constitutional:  Body mass index is 40.26 kg/m. , not in acute distress, normal state of mind, ? Memory  deficits Eyes:  EOMI, no exophthalmos Neck: Supple Cardiovascular: RRR, no murmurs, rubs, or gallops, no edema Respiratory: Adequate breathing efforts, no crackles, rales, rhonchi, or wheezing Musculoskeletal: no gross deformities, strength intact in all four extremities, no gross restriction of joint movements Skin:  no rashes, no hyperemia Neurological: no tremor with outstretched hands    Results for orders placed or performed in visit on 10/04/21  POCT glycosylated hemoglobin (Hb A1C)  Result Value Ref Range   Hemoglobin A1C     HbA1c POC (<> result, manual entry)     HbA1c, POC (prediabetic range)  HbA1c, POC (controlled diabetic range) 8.6 (A) 0.0 - 7.0 %   Lab Results  Component Value Date   WBC 11.9 (H) 01/31/2021   HGB 14.1 01/31/2021   HCT 43.5 01/31/2021   MCV 91.6 01/31/2021   PLT 227 01/31/2021    Lab Results  Component Value Date   HGBA1C 8.6 (A) 10/04/2021   HGBA1C 8.2 (A) 07/04/2021   HGBA1C 8.6 (A) 11/09/2020     Lipid Panel     Component Value Date/Time   CHOL 172 10/02/2021 1419   TRIG 295 (H) 10/02/2021 1419   TRIG 329 10/02/2007 0000   HDL 38 (L) 10/02/2021 1419   CHOLHDL 4.5 10/02/2021 1419   CHOLHDL 4.9 02/26/2020 1002   VLDL 19 09/09/2018 0949   LDLCALC 85 10/02/2021 1419   LDLCALC 136 (H) 02/26/2020 1002   LDLCALC 66 10/02/2007 0000     Assessment & Plan:   1) Type 2 diabetes mellitus with stage 3 chronic kidney disease, with long-term current use of insulin    He presents today with his CGM, no logs, showing above target glycemic profile overall.  His POCT A1c today is 8.6%, increasing from last visit of 8.2%.  Analysis of his CGM shows TIR 28%, TAR 72%, TBR 0% with a GMI of 8.6%.  He does admit to drinking some soda, although in small quantities, and has not been exercising optimally during the winter months.  He ran out of his Ozempic between visits, says he could not get a refill.   Recent labs reviewed.  - His diabetes is  complicated by CAD, CKD, CVA, Retinopathy. Patient remains at a high risk for more acute and chronic complications of diabetes which include CAD, CVA, CKD, retinopathy, and neuropathy. These are all discussed in detail with the patient.  - Nutritional counseling repeated at each appointment due to patients tendency to fall back in to old habits.  - The patient admits there is a room for improvement in their diet and drink choices. -  Suggestion is made for the patient to avoid simple carbohydrates from their diet including Cakes, Sweet Desserts / Pastries, Ice Cream, Soda (diet and regular), Sweet Tea, Candies, Chips, Cookies, Sweet Pastries, Store Bought Juices, Alcohol in Excess of 1-2 drinks a day, Artificial Sweeteners, Coffee Creamer, and "Sugar-free" Products. This will help patient to have stable blood glucose profile and potentially avoid unintended weight gain.   - I encouraged the patient to switch to unprocessed or minimally processed complex starch and increased protein intake (animal or plant source), fruits, and vegetables.   - Patient is advised to stick to a routine mealtimes to eat 3 meals a day and avoid unnecessary snacks (to snack only to correct hypoglycemia).  -He will continue to require intensive treatment with basal/bolus insulin in order for him to achieve and maintain control of diabetes to target.    -We discussed adjusting his lifestyle more to achieve better control of his diabetes.  He is advised to stay on same dose of Lantus 100 units SQ nightly, continue Humalog 20-26 units TID with meals if glucose is above 90 and he is eating (Specific instructions on how to titrate insulin dosage based on glucose readings given to patient in writing), continue Glipizide 10 mg XL daily with breakfast.  He is advised to restart his Ozempic 0.5 mg SQ weekly to aid in postprandial hyperglycemia and weight management.  -He is encouraged to continue using his CGM to monitor glucose 4  times daily, before meals  and before bed, and to call the clinic if he has readings less than 70 or greater than 200 for 3 tests in a row.  -Due to CKD patient is not a candidate for Metformin , SGLT2i.  -Target numbers for A1c, LDL, HDL, Triglycerides, Waist Circumference were discussed in detail.  2) Lipids/HPL:  His recent lipid panel from 10/02/21 shows controlled LDL of 73 and elevated triglycerides of 295.  He is advised to continue Crestor 5 mg p.o. daily at bedtime, Omega-3 fatty acid supplement, and Fenofibrate 160 mg p.o. daily.  Side effects and precautions discussed with him.  3) Hypothyroidism:   -His previsit thyroid function tests are consistent with slight under-replacement, however he does note that he sometimes takes it with all of his other medications.  He is advised to continue his Levothyroxine 175 mcg po daily before breakfast.     - We discussed about the correct intake of his thyroid hormone, on empty stomach at fasting, with water, separated by at least 30 minutes from breakfast and other medications,  and separated by more than 4 hours from calcium, iron, multivitamins, acid reflux medications (PPIs). -Patient is made aware of the fact that thyroid hormone replacement is needed for life, dose to be adjusted by periodic monitoring of thyroid function tests.  4) Hypertension/BP- His blood pressure is controlled to target.  He is advised to continue Losartan 50 mg po daily.  5) Vitamin D Deficiency His most recent vitamin D level was 22.2 on 10/02/21.  He takes an OTC supplement.  I discussed and initiated replenishment with Ergocalciferol 50000 units PO weekly x 12 weeks.  6) Hypercalcemia: His most recent calcium level was 11.5 on 10/02/21, worsening from previous reading.  This is the second occurrence of high calcium levels.  He denies any personal or family history of thyroid cancer, adrenal problems, parathyroid problems, kidney stones, or fragility fractures.  He  does have vitamin D deficiency, now on replenishment therapy with Ergocalciferol.  He also has a history of mild renal impairment, currently stable GFR of 60 and creatinine of 1.29.  He does NOT see a kidney specialist (says he did not care for the doctor who was seeing him previously).  Will do more comprehensive labs, including 24 h urine specimen to assess possible etiology of hypercalcemia such as parathyroid adenoma, FHH (familial hypocalciuric hypercalcemia), or other nutrient deficiencies.  We discussed the potential complications of hypercalcemia.     I advised patient to maintain close follow up with his PCP for primary care needs.      I spent 48 minutes in the care of the patient today including review of labs from [place], Lipids, Thyroid Function, Hematology (current and previous including abstractions from other facilities); face-to-face time discussing  his blood glucose readings/logs, discussing hypoglycemia and hyperglycemia episodes and symptoms, medications doses, his options of short and long term treatment based on the latest standards of care / guidelines;  discussion about incorporating lifestyle medicine;  and documenting the encounter.    Please refer to Patient Instructions for Blood Glucose Monitoring and Insulin/Medications Dosing Guide"  in media tab for additional information. Please  also refer to " Patient Self Inventory" in the Media  tab for reviewed elements of pertinent patient history.  [name] participated in the discussions, expressed understanding, and voiced agreement with the above plans.  All questions were answered to his satisfaction. he is encouraged to contact clinic should he have any questions or concerns prior to his return  visit.    Follow up plan: Return in about 2 weeks (around 10/18/2021) for hypercalcemia follow up with labs and 24 hr urine.   [name], [facility] [address] [place], [place] [address] Phone: [phone] Fax: [phone]   10/04/2021, 2:12 PM

## 2021-10-04 NOTE — Patient Instructions (Signed)

## 2021-10-12 DIAGNOSIS — M65331 Trigger finger, right middle finger: Secondary | ICD-10-CM | POA: Diagnosis not present

## 2021-10-12 DIAGNOSIS — M65332 Trigger finger, left middle finger: Secondary | ICD-10-CM | POA: Diagnosis not present

## 2021-10-16 LAB — CREATININE, URINE, 24 HOUR
Creatinine, 24H Ur: 1584 mg/24 hr (ref 1000–2000)
Creatinine, Urine: 70.4 mg/dL

## 2021-10-16 LAB — PTH, INTACT AND CALCIUM
Calcium: 10 mg/dL (ref 8.6–10.2)
PTH: 21 pg/mL (ref 15–65)

## 2021-10-16 LAB — CALCIUM, URINE, 24 HOUR
Calcium, 24H Urine: 92 mg/24 hr (ref 0–320)
Calcium, Urine: 4.1 mg/dL

## 2021-10-16 LAB — PTH-RELATED PEPTIDE: PTH-related peptide: <2 pmol/L

## 2021-10-16 LAB — MAGNESIUM: Magnesium: 1.9 mg/dL (ref 1.6–2.3)

## 2021-10-16 LAB — PHOSPHORUS: Phosphorus: 3.3 mg/dL (ref 2.8–4.1)

## 2021-10-18 ENCOUNTER — Ambulatory Visit: Payer: Medicare Other | Admitting: Nurse Practitioner

## 2021-10-18 ENCOUNTER — Encounter: Payer: Self-pay | Admitting: Nurse Practitioner

## 2021-10-18 ENCOUNTER — Ambulatory Visit (INDEPENDENT_AMBULATORY_CARE_PROVIDER_SITE_OTHER): Payer: Medicare Other | Admitting: Nurse Practitioner

## 2021-10-18 ENCOUNTER — Other Ambulatory Visit: Payer: Self-pay

## 2021-10-18 VITALS — BP 185/69 | HR 95 | Ht 69.0 in | Wt 274.8 lb

## 2021-10-18 DIAGNOSIS — E559 Vitamin D deficiency, unspecified: Secondary | ICD-10-CM | POA: Diagnosis not present

## 2021-10-18 DIAGNOSIS — I10 Essential (primary) hypertension: Secondary | ICD-10-CM

## 2021-10-18 DIAGNOSIS — E039 Hypothyroidism, unspecified: Secondary | ICD-10-CM | POA: Diagnosis not present

## 2021-10-18 DIAGNOSIS — E1159 Type 2 diabetes mellitus with other circulatory complications: Secondary | ICD-10-CM | POA: Diagnosis not present

## 2021-10-18 DIAGNOSIS — E782 Mixed hyperlipidemia: Secondary | ICD-10-CM

## 2021-10-18 MED ORDER — SEMAGLUTIDE (1 MG/DOSE) 4 MG/3ML ~~LOC~~ SOPN: 1.0000 mg | PEN_INJECTOR | SUBCUTANEOUS | 3 refills | Status: DC

## 2021-10-18 MED ORDER — TOUJEO MAX SOLOSTAR 300 UNIT/ML ~~LOC~~ SOPN: 90.0000 [IU] | PEN_INJECTOR | Freq: Every evening | SUBCUTANEOUS | 3 refills | Status: DC

## 2021-10-18 NOTE — Progress Notes (Signed)
10/18/2021   Endocrinology follow-up note   Subjective:    Patient ID: [name], male    DOB: 1956-03-01,    Past Medical History:  Diagnosis Date   ASCVD (arteriosclerotic cardiovascular disease)    -MI in 01/2001 prompted CABG; EF-30% at cath; 50% on echo in 7/02 and normal in 2005   Asthma    Cerebrovascular disease     L CEA 06/2004 following left renal embolism; 10/2008 plaque w/o focal stenosis   Chronic back pain    Chronic leg pain    Chronic obstructive pulmonary disease (HCC)    Degenerative joint disease     chronic LBP-s/p L3-4 fusion   Diabetes mellitus    -no insulin   Hyperlipidemia    Hypertension    Hypothyroidism    Obesity    Restless leg syndrome    Possible   Tobacco abuse, in remission    50 pack years; discontinued in 2002   Past Surgical History:  Procedure Laterality Date   CAROTID ENDARTERECTOMY  08/14/2003   Left   CATARACT EXTRACTION     bilateral   CHOLECYSTECTOMY N/A 02/06/2021   Procedure: LAPAROSCOPIC CHOLECYSTECTOMY;  Surgeon: Franky MachoJenkins, Mark, MD;  Location: [facility];  Service: General;  Laterality: N/A;   CORONARY ARTERY BYPASS GRAFT  08/13/2000   GALLBLADDER SURGERY  02/07/2021   LUMBAR SPINE SURGERY     L3-4 fusion   Social History   Socioeconomic History   Marital status: Divorced    Spouse name: Not on file   Number of children: 1   Years of education: Not on file   Highest education level: Not on file  Occupational History   Occupation: veteran    Comment: disabledf  Tobacco Use   Smoking status: Former    Packs/day: 1.00    Years: 50.00    Pack years: 50.00    Types: Cigarettes    Quit date: 01/15/2001    Years since quitting: 20.7   Smokeless tobacco: Never  Vaping Use   Vaping Use: Never used  Substance and Sexual Activity   Alcohol use: No   Drug use: No   Sexual activity: Not Currently  Other Topics Concern   Not on file  Social History Narrative   Not on file   Social Determinants of Health    Financial Resource Strain: Not on file  Food Insecurity: Not on file  Transportation Needs: Not on file  Physical Activity: Not on file  Stress: Not on file  Social Connections: Not on file   Outpatient Encounter Medications as of 10/18/2021  Medication Sig   albuterol (VENTOLIN HFA) 108 (90 Base) MCG/ACT inhaler Inhale 1-2 puffs into the lungs every 6 (six) hours as needed for shortness of breath or wheezing.   albuterol-ipratropium (COMBIVENT) 18-103 MCG/ACT inhaler Inhale 2 puffs into the lungs every 6 (six) hours as needed for wheezing or shortness of breath.    Ascorbic Acid (VITAMIN C) 1000 MG tablet Take 2,000 mg by mouth daily.   aspirin 81 MG tablet Take 81 mg by mouth daily.     B Complex-Biotin-FA (VITAMIN B50 COMPLEX PO) Take 1 capsule by mouth daily.   Cholecalciferol (VITAMIN D PO) Take 1 capsule by mouth daily.   Continuous Blood Gluc Sensor (FREESTYLE LIBRE 14 DAY SENSOR) MISC Inject 1 each into the skin every 14 (fourteen) days. Use as directed.   Cyanocobalamin ([employer] B-12) 1000 MCG/15ML LIQD Place 15 mLs under the tongue 2 (two) times a  week.   cyclobenzaprine (FLEXERIL) 10 MG tablet Take 10 mg by mouth 3 (three) times daily as needed for muscle spasms.   docusate sodium (COLACE) 100 MG capsule Take 200 mg by mouth daily.   fenofibrate 160 MG tablet Take 160 mg by mouth daily.   fish oil-omega-3 fatty acids 1000 MG capsule Take 2 g by mouth daily.   gabapentin (NEURONTIN) 300 MG capsule Take 300 mg by mouth at bedtime.   glipiZIDE (GLUCOTROL XL) 10 MG 24 hr tablet Take 1 tablet (10 mg total) by mouth daily with breakfast.   glucose blood (ACCU-CHEK AVIVA PLUS) test strip Use as directed to check blood glucose four times daily   HYDROmorphone (DILAUDID) 4 MG tablet Take 4 mg by mouth 3 (three) times daily.   insulin lispro (HUMALOG) 100 UNIT/ML KwikPen Inject 20-26 Units into the skin 3 (three) times daily.   levothyroxine (SYNTHROID) 175 MCG tablet TAKE 1 TABLET DAILY  BEFORE BREAKFAST.   losartan (COZAAR) 50 MG tablet TAKE ONE TABLET BY MOUTH ONCE DAILY.   Multiple Vitamins-Minerals (MULTIVITAMIN WITH MINERALS) tablet Take 1 tablet by mouth daily.     nitroGLYCERIN (NITROSTAT) 0.4 MG SL tablet Place 0.4 mg under the tongue every 5 (five) minutes as needed for chest pain.   rosuvastatin (CRESTOR) 40 MG tablet TAKE ONE TABLET BY MOUTH ONCE DAILY.   Semaglutide, 1 MG/DOSE, 4 MG/3ML SOPN Inject 1 mg as directed once a week.   SURE COMFORT PEN NEEDLES 31G X 8 MM MISC USE AS DIRECTED WITH LANTUS AND NOVOLOG UP TO FOUR TIMES DAILY.   Vitamin D, Ergocalciferol, (DRISDOL) 1.25 MG (50000 UNIT) CAPS capsule Take 1 capsule (50,000 Units total) by mouth every 7 (seven) days.   [DISCONTINUED] insulin glargine, 2 Unit Dial, (TOUJEO MAX SOLOSTAR) 300 UNIT/ML Solostar Pen Inject 100 Units into the skin at bedtime.   [DISCONTINUED] Semaglutide,0.25 or 0.5MG /DOS, (OZEMPIC, 0.25 OR 0.5 MG/DOSE,) 2 MG/1.5ML SOPN Inject 0.5 mg into the skin once a week.   insulin glargine, 2 Unit Dial, (TOUJEO MAX SOLOSTAR) 300 UNIT/ML Solostar Pen Inject 90 Units into the skin at bedtime.   No facility-administered encounter medications on file as of 10/18/2021.   ALLERGIES: Allergies  Allergen Reactions   Alprazolam Nausea And Vomiting and Other (See Comments)    Altered mental status   Atorvastatin Nausea And Vomiting   Gemfibrozil     unknown   Invokana [Canagliflozin] Diarrhea and Nausea And Vomiting   Definity [Perflutren Lipid Microsphere] Other (See Comments)    Headache   VACCINATION STATUS: Immunization History  Administered Date(s) Administered   Influenza-Unspecified 05/13/2014    Diabetes He presents for his follow-up diabetic visit. He has type 2 diabetes mellitus. Onset time: He was diagnosed at approximate age of 1 years. His disease course has been improving. There are no hypoglycemic associated symptoms. Pertinent negatives for hypoglycemia include no confusion,  headaches, nervousness/anxiousness, pallor or sweats. Associated symptoms include foot paresthesias. Pertinent negatives for diabetes include no fatigue, no polydipsia, no polyphagia, no polyuria and no weight loss. There are no hypoglycemic complications. Symptoms are stable. Diabetic complications include a CVA, heart disease, nephropathy and peripheral neuropathy. Risk factors for coronary artery disease include diabetes mellitus, dyslipidemia, hypertension, male sex, tobacco exposure, sedentary lifestyle and obesity. Current diabetic treatment includes intensive insulin program and oral agent (monotherapy) (and Ozempic). He is compliant with treatment most of the time. His weight is fluctuating minimally. He is following a generally unhealthy diet. When asked about meal planning, he  reported none. He has had a previous visit with a dietitian. He participates in exercise intermittently. His home blood glucose trend is fluctuating minimally. His overall blood glucose range is 140-180 mg/dl. (He presents today with his CGM, no logs, showing improved glycemic profile overall.  He was not due for another A1c today.  He recently lost his mother but he is able to take better care of himself.  Analysis of his CGM shows TIR 62%, TAR 35%, TBR 3% with a GMI of 7.3%.  He denies any known hypoglycemia. ) An ACE inhibitor/angiotensin II receptor blocker is being taken. He does not see a podiatrist.Eye exam is current.  Hyperlipidemia This is a chronic problem. The current episode started more than 1 year ago. The problem is uncontrolled. Recent lipid tests were reviewed and are variable. Exacerbating diseases include diabetes, hypothyroidism and obesity. He has no history of chronic renal disease. Factors aggravating his hyperlipidemia include fatty foods. Associated symptoms include myalgias. Current antihyperlipidemic treatment includes statins. The current treatment provides moderate improvement of lipids. Compliance  problems include adherence to diet and adherence to exercise.  Risk factors for coronary artery disease include family history, dyslipidemia, diabetes mellitus, hypertension, male sex, a sedentary lifestyle and obesity.  Thyroid Problem Presents for follow-up (He has longstanding hypothyroidism, currently on levothyroxine 150 mcg p.o. daily before breakfast.  He reports compliance with medication.) visit. Patient reports no anxiety, cold intolerance, constipation, depressed mood, diarrhea, fatigue, heat intolerance, palpitations, weight gain or weight loss. The symptoms have been stable. His past medical history is significant for diabetes and hyperlipidemia.    Review of systems  Constitutional: + stable body weight,  current Body mass index is 40.58 kg/m. , no fatigue, no subjective hyperthermia, no subjective hypothermia Eyes: no blurry vision, no xerophthalmia ENT: no sore throat, no nodules palpated in throat, no dysphagia/odynophagia, no hoarseness Cardiovascular: no chest pain, no shortness of breath, no palpitations, no leg swelling Respiratory: no cough, no shortness of breath Gastrointestinal: no nausea/vomiting/diarrhea Musculoskeletal: diffuse muscle/joint aches Skin: no rashes, no hyperemia Neurological: no tremors, no numbness, no tingling, no dizziness Psychiatric: no depression, no anxiety, recently lost his mother   Objective:    BP (!) 185/69    Pulse 95    Ht 5\' 9"  (1.753 m)    Wt 274 lb 12.8 oz (124.6 kg)    SpO2 95%    BMI 40.58 kg/m   Wt Readings from Last 3 Encounters:  10/18/21 274 lb 12.8 oz (124.6 kg)  10/04/21 272 lb 9.6 oz (123.7 kg)  07/04/21 280 lb (127 kg)   BP Readings from Last 3 Encounters:  10/18/21 (!) 185/69  10/04/21 107/72  07/04/21 121/73    Physical Exam- Limited  Constitutional:  Body mass index is 40.58 kg/m. , not in acute distress, normal state of mind, ? Memory deficits Eyes:  EOMI, no exophthalmos Neck: Supple Cardiovascular:  RRR, no murmurs, rubs, or gallops, no edema Respiratory: Adequate breathing efforts, no crackles, rales, rhonchi, or wheezing Musculoskeletal: no gross deformities, strength intact in all four extremities, no gross restriction of joint movements Skin:  no rashes, no hyperemia Neurological: no tremor with outstretched hands    Results for orders placed or performed in visit on 10/04/21  PTH, intact and calcium  Result Value Ref Range   Calcium 10.0 8.6 - 10.2 mg/dL   PTH 21 15 - 65 pg/mL   PTH Interp Comment   Magnesium  Result Value Ref Range   Magnesium 1.9 1.6 -  2.3 mg/dL  Phosphorus  Result Value Ref Range   Phosphorus 3.3 2.8 - 4.1 mg/dL  PTH-Related Peptide  Result Value Ref Range   PTH-related peptide <2.0 pmol/L  Calcium, urine, 24 hour  Result Value Ref Range   Calcium, Urine 4.1 Not Estab. mg/dL   Calcium, 24H Urine 92 0 - 320 mg/24 hr  Creatinine, urine, 24 hour  Result Value Ref Range   Creatinine, Urine 70.4 Not Estab. mg/dL   Creatinine, 24H Ur 1,584 1,000 - 2,000 mg/24 hr  POCT glycosylated hemoglobin (Hb A1C)  Result Value Ref Range   Hemoglobin A1C     HbA1c POC (<> result, manual entry)     HbA1c, POC (prediabetic range)     HbA1c, POC (controlled diabetic range) 8.6 (A) 0.0 - 7.0 %   Lab Results  Component Value Date   WBC 11.9 (H) 01/31/2021   HGB 14.1 01/31/2021   HCT 43.5 01/31/2021   MCV 91.6 01/31/2021   PLT 227 01/31/2021    Lab Results  Component Value Date   HGBA1C 8.6 (A) 10/04/2021   HGBA1C 8.2 (A) 07/04/2021   HGBA1C 8.6 (A) 11/09/2020     Lipid Panel     Component Value Date/Time   CHOL 172 10/02/2021 1419   TRIG 295 (H) 10/02/2021 1419   TRIG 329 10/02/2007 0000   HDL 38 (L) 10/02/2021 1419   CHOLHDL 4.5 10/02/2021 1419   CHOLHDL 4.9 02/26/2020 1002   VLDL 19 09/09/2018 0949   LDLCALC 85 10/02/2021 1419   LDLCALC 136 (H) 02/26/2020 1002   LDLCALC 66 10/02/2007 0000     Assessment & Plan:   1) Type 2 diabetes  mellitus with stage 3 chronic kidney disease, with long-term current use of insulin   He presents today with his CGM, no logs, showing improved glycemic profile overall.  He was not due for another A1c today.  He recently lost his mother but he is able to take better care of himself.  Analysis of his CGM shows TIR 62%, TAR 35%, TBR 3% with a GMI of 7.3%.  He denies any known hypoglycemia.   Recent labs reviewed.  - His diabetes is complicated by CAD, CKD, CVA, Retinopathy. Patient remains at a high risk for more acute and chronic complications of diabetes which include CAD, CVA, CKD, retinopathy, and neuropathy. These are all discussed in detail with the patient.  - Nutritional counseling repeated at each appointment due to patients tendency to fall back in to old habits.  - The patient admits there is a room for improvement in their diet and drink choices. -  Suggestion is made for the patient to avoid simple carbohydrates from their diet including Cakes, Sweet Desserts / Pastries, Ice Cream, Soda (diet and regular), Sweet Tea, Candies, Chips, Cookies, Sweet Pastries, Store Bought Juices, Alcohol in Excess of 1-2 drinks a day, Artificial Sweeteners, Coffee Creamer, and "Sugar-free" Products. This will help patient to have stable blood glucose profile and potentially avoid unintended weight gain.   - I encouraged the patient to switch to unprocessed or minimally processed complex starch and increased protein intake (animal or plant source), fruits, and vegetables.   - Patient is advised to stick to a routine mealtimes to eat 3 meals a day and avoid unnecessary snacks (to snack only to correct hypoglycemia).  -He will continue to require intensive treatment with basal/bolus insulin in order for him to achieve and maintain control of diabetes to target.    -He is advised  to lower his dose of Toujeo to 90 units SQ nightly, continue Humalog 20-26 units TID with meals if glucose is above 90 and he is  eating (Specific instructions on how to titrate insulin dosage based on glucose readings given to patient in writing), continue Glipizide 10 mg XL daily with breakfast.  I discussed and increased his Ozempic to 1 mg SQ weekly once he depletes his current supply.    -He is encouraged to continue using his CGM to monitor glucose 4 times daily, before meals and before bed, and to call the clinic if he has readings less than 70 or greater than 200 for 3 tests in a row.  -Due to CKD patient is not a candidate for Metformin , SGLT2i.  -Target numbers for A1c, LDL, HDL, Triglycerides, Waist Circumference were discussed in detail.  2) Lipids/HPL:  His recent lipid panel from 10/02/21 shows controlled LDL of 73 and elevated triglycerides of 295.  He is advised to continue Crestor 5 mg p.o. daily at bedtime, Omega-3 fatty acid supplement, and Fenofibrate 160 mg p.o. daily.  Side effects and precautions discussed with him.  3) Hypothyroidism:   -His previsit thyroid function tests are consistent with slight under-replacement, however he does note that he sometimes takes it with all of his other medications.  He is advised to continue his Levothyroxine 175 mcg po daily before breakfast.     - We discussed about the correct intake of his thyroid hormone, on empty stomach at fasting, with water, separated by at least 30 minutes from breakfast and other medications,  and separated by more than 4 hours from calcium, iron, multivitamins, acid reflux medications (PPIs). -Patient is made aware of the fact that thyroid hormone replacement is needed for life, dose to be adjusted by periodic monitoring of thyroid function tests.  4) Hypertension/BP- His blood pressure is controlled to target.  He is advised to continue Losartan 50 mg po daily.  5) Vitamin D Deficiency His most recent vitamin D level was 22.2 on 10/02/21.  He takes an OTC supplement.  I discussed and initiated replenishment with Ergocalciferol 50000  units PO weekly x 12 weeks.  6) Hypercalcemia: His most recent calcium level was 11.5 on 10/02/21, worsening from previous reading.  This is the second occurrence of high calcium levels.  He denies any personal or family history of thyroid cancer, adrenal problems, parathyroid problems, kidney stones, or fragility fractures.  His repeat calcium levels returned to normal of 10. Phosphorous and Magnesium was also WNL.  PTH was normal at 21, PTH-rp negative.  His urine studies were also unremarkable, ruling out [place].  Given his labs have returned to normal, no need for further investigation at this time.  He does have vitamin D deficiency, now on replenishment therapy with Ergocalciferol (which may have contributed to his high calcium previously).  He also has a history of mild renal impairment, currently stable GFR of 60 and creatinine of 1.29.        I advised patient to maintain close follow up with his PCP for primary care needs.      I spent 32 minutes in the care of the patient today including review of labs from [facility], Lipids, Thyroid Function, Hematology (current and previous including abstractions from other facilities); face-to-face time discussing  his blood glucose readings/logs, discussing hypoglycemia and hyperglycemia episodes and symptoms, medications doses, his options of short and long term treatment based on the latest standards of care / guidelines;  discussion about  incorporating lifestyle medicine;  and documenting the encounter.    Please refer to Patient Instructions for Blood Glucose Monitoring and Insulin/Medications Dosing Guide"  in media tab for additional information. Please  also refer to " Patient Self Inventory" in the Media  tab for reviewed elements of pertinent patient history.  [name] participated in the discussions, expressed understanding, and voiced agreement with the above plans.  All questions were answered to his satisfaction. he is encouraged to  contact clinic should he have any questions or concerns prior to his return visit.    Follow up plan: Return in about 3 months (around 01/18/2022) for Diabetes F/U with A1c in office, No previsit labs, Bring meter and logs.   [name], [facility] [address] [place], [place] [address] Phone: [phone] Fax: [phone]   10/18/2021, 1:57 PM

## 2021-10-18 NOTE — Patient Instructions (Signed)

## 2021-10-26 ENCOUNTER — Other Ambulatory Visit: Payer: Self-pay | Admitting: Cardiology

## 2021-10-26 DIAGNOSIS — I10 Essential (primary) hypertension: Secondary | ICD-10-CM

## 2021-10-26 DIAGNOSIS — E7800 Pure hypercholesterolemia, unspecified: Secondary | ICD-10-CM

## 2021-10-31 ENCOUNTER — Telehealth: Payer: Self-pay | Admitting: Cardiology

## 2021-10-31 DIAGNOSIS — I10 Essential (primary) hypertension: Secondary | ICD-10-CM

## 2021-10-31 DIAGNOSIS — E7800 Pure hypercholesterolemia, unspecified: Secondary | ICD-10-CM

## 2021-10-31 NOTE — Telephone Encounter (Signed)
?*  STAT* If patient is at the pharmacy, call can be transferred to refill team. ? ? ?1. Which medications need to be refilled? (please list name of each medication and dose if known) losartan (COZAAR) 50 MG tablet ? ?rosuvastatin (CRESTOR) 40 MG tablet ? ?2. Which pharmacy/location (including street and city if local pharmacy) is medication to be sent to? [facility] - [place], [place] - [address] ? ?3. Do they need a 30 day or 90 day supply? 90 ? ?

## 2021-11-01 MED ORDER — LOSARTAN POTASSIUM 50 MG PO TABS: 50.0000 mg | ORAL_TABLET | Freq: Every day | ORAL | 0 refills | Status: DC

## 2021-11-01 MED ORDER — ROSUVASTATIN CALCIUM 40 MG PO TABS: 40.0000 mg | ORAL_TABLET | Freq: Every day | ORAL | 0 refills | Status: DC

## 2021-11-01 NOTE — Telephone Encounter (Signed)
Refills has been sent to the pharmacy. 

## 2021-11-03 DIAGNOSIS — E1159 Type 2 diabetes mellitus with other circulatory complications: Secondary | ICD-10-CM | POA: Diagnosis not present

## 2021-11-03 DIAGNOSIS — Z794 Long term (current) use of insulin: Secondary | ICD-10-CM | POA: Diagnosis not present

## 2021-11-03 DIAGNOSIS — E1122 Type 2 diabetes mellitus with diabetic chronic kidney disease: Secondary | ICD-10-CM | POA: Diagnosis not present

## 2021-11-09 DIAGNOSIS — M1991 Primary osteoarthritis, unspecified site: Secondary | ICD-10-CM | POA: Diagnosis not present

## 2021-11-09 DIAGNOSIS — M65332 Trigger finger, left middle finger: Secondary | ICD-10-CM | POA: Diagnosis not present

## 2021-11-09 DIAGNOSIS — M65331 Trigger finger, right middle finger: Secondary | ICD-10-CM | POA: Diagnosis not present

## 2021-11-09 DIAGNOSIS — S161XXA Strain of muscle, fascia and tendon at neck level, initial encounter: Secondary | ICD-10-CM | POA: Diagnosis not present

## 2021-11-09 DIAGNOSIS — M5136 Other intervertebral disc degeneration, lumbar region: Secondary | ICD-10-CM | POA: Diagnosis not present

## 2021-11-09 DIAGNOSIS — G894 Chronic pain syndrome: Secondary | ICD-10-CM | POA: Diagnosis not present

## 2021-11-11 ENCOUNTER — Other Ambulatory Visit: Payer: Self-pay | Admitting: Nurse Practitioner

## 2021-12-04 DIAGNOSIS — E1159 Type 2 diabetes mellitus with other circulatory complications: Secondary | ICD-10-CM | POA: Diagnosis not present

## 2021-12-04 DIAGNOSIS — Z794 Long term (current) use of insulin: Secondary | ICD-10-CM | POA: Diagnosis not present

## 2021-12-04 DIAGNOSIS — E1122 Type 2 diabetes mellitus with diabetic chronic kidney disease: Secondary | ICD-10-CM | POA: Diagnosis not present

## 2021-12-07 DIAGNOSIS — M1991 Primary osteoarthritis, unspecified site: Secondary | ICD-10-CM | POA: Diagnosis not present

## 2021-12-07 DIAGNOSIS — M5136 Other intervertebral disc degeneration, lumbar region: Secondary | ICD-10-CM | POA: Diagnosis not present

## 2021-12-07 DIAGNOSIS — G894 Chronic pain syndrome: Secondary | ICD-10-CM | POA: Diagnosis not present

## 2021-12-25 DIAGNOSIS — M65332 Trigger finger, left middle finger: Secondary | ICD-10-CM | POA: Diagnosis not present

## 2021-12-25 DIAGNOSIS — M65331 Trigger finger, right middle finger: Secondary | ICD-10-CM | POA: Diagnosis not present

## 2022-01-04 DIAGNOSIS — E1159 Type 2 diabetes mellitus with other circulatory complications: Secondary | ICD-10-CM | POA: Diagnosis not present

## 2022-01-04 DIAGNOSIS — Z794 Long term (current) use of insulin: Secondary | ICD-10-CM | POA: Diagnosis not present

## 2022-01-04 DIAGNOSIS — E1122 Type 2 diabetes mellitus with diabetic chronic kidney disease: Secondary | ICD-10-CM | POA: Diagnosis not present

## 2022-01-17 ENCOUNTER — Telehealth: Payer: Self-pay | Admitting: Nurse Practitioner

## 2022-01-17 MED ORDER — FREESTYLE LIBRE 2 READER DEVI: 0 refills | Status: AC

## 2022-01-17 MED ORDER — FREESTYLE LIBRE 2 SENSOR MISC: 3 refills | Status: DC

## 2022-01-17 NOTE — Telephone Encounter (Signed)
Pt called and said he has been having to prick his finger because [employer] has not sent his sensor yet. He would like for [employer] sent to [employer] and he was told he needs to get the [place] 2. He also is requesting test strips sent in as he has been having to pay out of pocket for test strips since his sensors never came in from [facility].   [place], [place] Phone:  [phone]  Fax:  [phone]

## 2022-01-17 NOTE — Telephone Encounter (Signed)
I sent in for the [place] 2 to [employer].  I don't have a back up meter he uses on file, otherwise I would have sent strips too.  He can have them send [place] a request or let us know which meter he is using.

## 2022-01-17 NOTE — Telephone Encounter (Signed)
Left VM for pt. Let him know sensors were sent in and also if he needs strips to call us back to let us know which meter he has or which meter insurance will cover.

## 2022-01-18 ENCOUNTER — Ambulatory Visit: Payer: Medicare Other | Admitting: Nurse Practitioner

## 2022-01-22 ENCOUNTER — Encounter: Payer: Self-pay | Admitting: Nurse Practitioner

## 2022-01-22 ENCOUNTER — Ambulatory Visit (INDEPENDENT_AMBULATORY_CARE_PROVIDER_SITE_OTHER): Payer: Medicare Other | Admitting: Nurse Practitioner

## 2022-01-22 VITALS — BP 158/85 | HR 76 | Ht 69.0 in | Wt 265.0 lb

## 2022-01-22 DIAGNOSIS — E782 Mixed hyperlipidemia: Secondary | ICD-10-CM

## 2022-01-22 DIAGNOSIS — E039 Hypothyroidism, unspecified: Secondary | ICD-10-CM | POA: Diagnosis not present

## 2022-01-22 DIAGNOSIS — E1159 Type 2 diabetes mellitus with other circulatory complications: Secondary | ICD-10-CM | POA: Diagnosis not present

## 2022-01-22 DIAGNOSIS — I10 Essential (primary) hypertension: Secondary | ICD-10-CM | POA: Diagnosis not present

## 2022-01-22 DIAGNOSIS — E559 Vitamin D deficiency, unspecified: Secondary | ICD-10-CM

## 2022-01-22 LAB — POCT GLYCOSYLATED HEMOGLOBIN (HGB A1C): HbA1c, POC (controlled diabetic range): 6.4 % (ref 0.0–7.0)

## 2022-01-22 LAB — POCT UA - MICROALBUMIN
Creatinine, POC: 200 mg/dL
Microalbumin Ur, POC: 80 mg/L

## 2022-01-22 MED ORDER — INSULIN LISPRO (1 UNIT DIAL) 100 UNIT/ML (KWIKPEN): 15.0000 [IU] | PEN_INJECTOR | Freq: Three times a day (TID) | SUBCUTANEOUS | 3 refills | Status: DC

## 2022-01-22 MED ORDER — SEMAGLUTIDE (1 MG/DOSE) 4 MG/3ML ~~LOC~~ SOPN: 1.0000 mg | PEN_INJECTOR | SUBCUTANEOUS | 3 refills | Status: DC

## 2022-01-22 MED ORDER — TOUJEO MAX SOLOSTAR 300 UNIT/ML ~~LOC~~ SOPN: 80.0000 [IU] | PEN_INJECTOR | Freq: Every evening | SUBCUTANEOUS | 3 refills | Status: DC

## 2022-01-22 NOTE — Progress Notes (Signed)
01/22/2022   Endocrinology follow-up note   Subjective:    Patient ID: [name], male    DOB: 06/09/1956,    Past Medical History:  Diagnosis Date   ASCVD (arteriosclerotic cardiovascular disease)    -MI in 01/2001 prompted CABG; EF-30% at cath; 50% on echo in 7/02 and normal in 2005   Asthma    Cerebrovascular disease     L CEA 06/2004 following left renal embolism; 10/2008 plaque w/o focal stenosis   Chronic back pain    Chronic leg pain    Chronic obstructive pulmonary disease ([place])    Degenerative joint disease     chronic LBP-s/p L3-4 fusion   Diabetes mellitus    -no insulin   Hyperlipidemia    Hypertension    Hypothyroidism    Obesity    Restless leg syndrome    Possible   Tobacco abuse, in remission    50 pack years; discontinued in 2002   Past Surgical History:  Procedure Laterality Date   CAROTID ENDARTERECTOMY  08/14/2003   Left   CATARACT EXTRACTION     bilateral   CHOLECYSTECTOMY N/A 02/06/2021   Procedure: LAPAROSCOPIC CHOLECYSTECTOMY;  Surgeon: Aviva Signs, MD;  Location: [facility];  Service: General;  Laterality: N/A;   CORONARY ARTERY BYPASS GRAFT  08/13/2000   GALLBLADDER SURGERY  02/07/2021   LUMBAR SPINE SURGERY     L3-4 fusion   Social History   Socioeconomic History   Marital status: Divorced    Spouse name: Not on file   Number of children: 1   Years of education: Not on file   Highest education level: Not on file  Occupational History   Occupation: veteran    Comment: disabledf  Tobacco Use   Smoking status: Former    Packs/day: 1.00    Years: 50.00    Total pack years: 50.00    Types: Cigarettes    Quit date: 01/15/2001    Years since quitting: 21.0   Smokeless tobacco: Never  Vaping Use   Vaping Use: Never used  Substance and Sexual Activity   Alcohol use: No   Drug use: No   Sexual activity: Not Currently  Other Topics Concern   Not on file  Social History Narrative   Not on file   Social Determinants of  Health   Financial Resource Strain: Not on file  Food Insecurity: Not on file  Transportation Needs: Not on file  Physical Activity: Not on file  Stress: Not on file  Social Connections: Not on file   Outpatient Encounter Medications as of 01/22/2022  Medication Sig   albuterol (VENTOLIN HFA) 108 (90 Base) MCG/ACT inhaler Inhale 1-2 puffs into the lungs every 6 (six) hours as needed for shortness of breath or wheezing.   albuterol-ipratropium (COMBIVENT) 18-103 MCG/ACT inhaler Inhale 2 puffs into the lungs every 6 (six) hours as needed for wheezing or shortness of breath.    alprazolam (XANAX) 2 MG tablet alprazolam 2 mg tablet   Ascorbic Acid (VITAMIN C) 1000 MG tablet Take 2,000 mg by mouth daily.   aspirin 81 MG tablet Take 81 mg by mouth daily.     B Complex-Biotin-FA (VITAMIN B50 COMPLEX PO) Take 1 capsule by mouth daily.   Cholecalciferol (VITAMIN D PO) Take 1 capsule by mouth daily.   Continuous Blood Gluc Receiver (FREESTYLE LIBRE 2 READER) DEVI Use to monitor glucose 4 times daily   Continuous Blood Gluc Sensor (FREESTYLE LIBRE 2 SENSOR) MISC Use to  monitor glucose 4 times daily.   Cyanocobalamin ([employer] B-12) 1000 MCG/15ML LIQD Place 15 mLs under the tongue 2 (two) times a week.   cyclobenzaprine (FLEXERIL) 10 MG tablet Take 10 mg by mouth 3 (three) times daily as needed for muscle spasms.   docusate sodium (COLACE) 100 MG capsule Take 200 mg by mouth daily.   fenofibrate 160 MG tablet Take 160 mg by mouth daily.   fish oil-omega-3 fatty acids 1000 MG capsule Take 2 g by mouth daily.   gabapentin (NEURONTIN) 300 MG capsule Take 300 mg by mouth at bedtime.   glucose blood (ACCU-CHEK AVIVA PLUS) test strip Use as directed to check blood glucose four times daily   HYDROmorphone (DILAUDID) 4 MG tablet Take 4 mg by mouth 3 (three) times daily.   insulin glargine, 2 Unit Dial, (TOUJEO MAX SOLOSTAR) 300 UNIT/ML Solostar Pen Inject 80 Units into the skin at bedtime.   insulin lispro  (HUMALOG) 100 UNIT/ML KwikPen Inject 15-21 Units into the skin 3 (three) times daily.   levothyroxine (SYNTHROID) 175 MCG tablet TAKE 1 TABLET DAILY BEFORE BREAKFAST.   losartan (COZAAR) 50 MG tablet Take 1 tablet (50 mg total) by mouth daily. KEEP OV.   Multiple Vitamins-Minerals (MULTIVITAMIN WITH MINERALS) tablet Take 1 tablet by mouth daily.     nitroGLYCERIN (NITROSTAT) 0.4 MG SL tablet Place 0.4 mg under the tongue every 5 (five) minutes as needed for chest pain.   rosuvastatin (CRESTOR) 40 MG tablet Take 1 tablet (40 mg total) by mouth daily. KEEP OV.   Semaglutide, 1 MG/DOSE, 4 MG/3ML SOPN Inject 1 mg as directed once a week.   SURE COMFORT PEN NEEDLES 31G X 8 MM MISC USE AS DIRECTED WITH LANTUS AND NOVOLOG UP TO FOUR TIMES DAILY.   Vitamin D, Ergocalciferol, (DRISDOL) 1.25 MG (50000 UNIT) CAPS capsule Take 1 capsule (50,000 Units total) by mouth every 7 (seven) days. (Patient not taking: Reported on 01/22/2022)   [DISCONTINUED] glipiZIDE (GLUCOTROL XL) 10 MG 24 hr tablet Take 1 tablet (10 mg total) by mouth daily with breakfast.   [DISCONTINUED] insulin glargine, 2 Unit Dial, (TOUJEO MAX SOLOSTAR) 300 UNIT/ML Solostar Pen Inject 90 Units into the skin at bedtime.   [DISCONTINUED] insulin lispro (HUMALOG) 100 UNIT/ML KwikPen INJECT 20 TO 26 UNITS INTO THE SKIN 3 TIMES DAILY BEFORE MEALS.   [DISCONTINUED] Semaglutide, 1 MG/DOSE, 4 MG/3ML SOPN Inject 1 mg as directed once a week. (Patient not taking: Reported on 01/22/2022)   No facility-administered encounter medications on file as of 01/22/2022.   ALLERGIES: Allergies  Allergen Reactions   Alprazolam Nausea And Vomiting and Other (See Comments)    Altered mental status   Atorvastatin Nausea And Vomiting   Gemfibrozil     unknown   Invokana [Canagliflozin] Diarrhea and Nausea And Vomiting   Definity [Perflutren Lipid Microsphere] Other (See Comments)    Headache   VACCINATION STATUS: Immunization History  Administered Date(s)  Administered   Influenza-Unspecified 05/13/2014    Diabetes He presents for his follow-up diabetic visit. He has type 2 diabetes mellitus. Onset time: He was diagnosed at approximate age of 16 years. His disease course has been improving. Hypoglycemia symptoms include dizziness, nervousness/anxiousness, sweats and tremors. Pertinent negatives for hypoglycemia include no confusion, headaches or pallor. Associated symptoms include foot paresthesias. Pertinent negatives for diabetes include no fatigue, no polydipsia, no polyphagia, no polyuria and no weight loss. There are no hypoglycemic complications. Symptoms are stable. Diabetic complications include a CVA, heart disease, nephropathy and peripheral  neuropathy. Risk factors for coronary artery disease include diabetes mellitus, dyslipidemia, hypertension, male sex, tobacco exposure, sedentary lifestyle and obesity. Current diabetic treatment includes intensive insulin program and oral agent (monotherapy) (and Ozempic). He is compliant with treatment most of the time. His weight is fluctuating minimally. He is following a generally unhealthy diet. When asked about meal planning, he reported none. He has had a previous visit with a dietitian. He participates in exercise intermittently. His home blood glucose trend is decreasing rapidly. His overall blood glucose range is 140-180 mg/dl. (He presents today with his CGM, no logs, showing frequent hypoglycemia.  His POCT A1c today is 6.4%, improving drastically from last visit of 8.6%.  Analysis of his CGM shows TIR 64%, TAR 10%, TBR 26%.  He notes he switched to the [facility] 2 system, thought his 14 day Elenor Legato was acting up due to all of the low readings.  He did not lower his dose of Toujeo as recommended at last visit.  He admits he has struggled with depression since the loss of his mother. ) An ACE inhibitor/angiotensin II receptor blocker is being taken. He does not see a podiatrist.Eye exam is current.   Hyperlipidemia This is a chronic problem. The current episode started more than 1 year ago. The problem is uncontrolled. Recent lipid tests were reviewed and are variable. Exacerbating diseases include diabetes, hypothyroidism and obesity. He has no history of chronic renal disease. Factors aggravating his hyperlipidemia include fatty foods. Associated symptoms include myalgias. Current antihyperlipidemic treatment includes statins. The current treatment provides moderate improvement of lipids. Compliance problems include adherence to diet and adherence to exercise.  Risk factors for coronary artery disease include family history, dyslipidemia, diabetes mellitus, hypertension, male sex, a sedentary lifestyle and obesity.  Thyroid Problem Presents for follow-up (He has longstanding hypothyroidism, currently on levothyroxine 150 mcg p.o. daily before breakfast.  He reports compliance with medication.) visit. Symptoms include anxiety and tremors. Patient reports no cold intolerance, constipation, depressed mood, diarrhea, fatigue, heat intolerance, palpitations, weight gain or weight loss. The symptoms have been stable. His past medical history is significant for diabetes and hyperlipidemia.     Review of systems  Constitutional: + steadily decreasing body weight,  current Body mass index is 39.13 kg/m. , no fatigue, no subjective hyperthermia, no subjective hypothermia Eyes: no blurry vision, no xerophthalmia ENT: no sore throat, no nodules palpated in throat, no dysphagia/odynophagia, no hoarseness Cardiovascular: no chest pain, no shortness of breath, no palpitations, no leg swelling Respiratory: no cough, no shortness of breath Gastrointestinal: no nausea/vomiting/diarrhea Musculoskeletal: diffuse muscle/joint aches Skin: no rashes, no hyperemia Neurological: no tremors, no numbness, no tingling, no dizziness Psychiatric: no depression, no anxiety, recently lost his mother   Objective:     BP (!) 158/85   Pulse 76   Ht 5\' 9"  (1.753 m)   Wt 265 lb (120.2 kg)   BMI 39.13 kg/m   Wt Readings from Last 3 Encounters:  01/22/22 265 lb (120.2 kg)  10/18/21 274 lb 12.8 oz (124.6 kg)  10/04/21 272 lb 9.6 oz (123.7 kg)   BP Readings from Last 3 Encounters:  01/22/22 (!) 158/85  10/18/21 (!) 185/69  10/04/21 107/72     Physical Exam- Limited  Constitutional:  Body mass index is 39.13 kg/m. , not in acute distress, normal state of mind Eyes:  EOMI, no exophthalmos Neck: Supple Cardiovascular: RRR, no murmurs, rubs, or gallops, no edema Respiratory: Adequate breathing efforts, no crackles, rales, rhonchi, or wheezing Musculoskeletal: no gross  deformities, strength intact in all four extremities, no gross restriction of joint movements Skin:  no rashes, no hyperemia Neurological: no tremor with outstretched hands  Diabetic Foot Exam - Simple   Simple Foot Form Diabetic Foot exam was performed with the following findings: Yes 01/22/2022  4:04 PM  Visual Inspection See comments: Yes Sensation Testing See comments: Yes Pulse Check Posterior Tibialis and Dorsalis pulse intact bilaterally: Yes Comments Decreased sensation to monofilament tool bilaterally; dry flaky skin to bilateral feet     Results for orders placed or performed in visit on 01/22/22  HgB A1c  Result Value Ref Range   Hemoglobin A1C     HbA1c POC (<> result, manual entry)     HbA1c, POC (prediabetic range)     HbA1c, POC (controlled diabetic range) 6.4 0.0 - 7.0 %  POCT UA - Microalbumin  Result Value Ref Range   Microalbumin Ur, POC 80 mg/L   Creatinine, POC 200 mg/dL   Albumin/Creatinine Ratio, Urine, POC 30-300    Lab Results  Component Value Date   WBC 11.9 (H) 01/31/2021   HGB 14.1 01/31/2021   HCT 43.5 01/31/2021   MCV 91.6 01/31/2021   PLT 227 01/31/2021    Lab Results  Component Value Date   HGBA1C 6.4 01/22/2022   HGBA1C 8.6 (A) 10/04/2021   HGBA1C 8.2 (A) 07/04/2021    MICROALBUR 80 01/22/2022   MICROALBUR 77.4 02/26/2020     Lipid Panel     Component Value Date/Time   CHOL 172 10/02/2021 1419   TRIG 295 (H) 10/02/2021 1419   TRIG 329 10/02/2007 0000   HDL 38 (L) 10/02/2021 1419   CHOLHDL 4.5 10/02/2021 1419   CHOLHDL 4.9 02/26/2020 1002   VLDL 19 09/09/2018 0949   LDLCALC 85 10/02/2021 1419   LDLCALC 136 (H) 02/26/2020 1002   LDLCALC 66 10/02/2007 0000     Assessment & Plan:   1) Type 2 diabetes mellitus with stage 3 chronic kidney disease, with long-term current use of insulin   He presents today with his CGM, no logs, showing frequent hypoglycemia.  His POCT A1c today is 6.4%, improving drastically from last visit of 8.6%.  Analysis of his CGM shows TIR 64%, TAR 10%, TBR 26%.  He notes he switched to the [place] 2 system, thought his 14 day Elenor Legato was acting up due to all of the low readings.  He did not lower his dose of Toujeo as recommended at last visit.  He admits he has struggled with depression since the loss of his mother.   Recent labs reviewed.  - His diabetes is complicated by CAD, CKD, CVA, Retinopathy. Patient remains at a high risk for more acute and chronic complications of diabetes which include CAD, CVA, CKD, retinopathy, and neuropathy. These are all discussed in detail with the patient.  - Nutritional counseling repeated at each appointment due to patients tendency to fall back in to old habits.  - The patient admits there is a room for improvement in their diet and drink choices. -  Suggestion is made for the patient to avoid simple carbohydrates from their diet including Cakes, Sweet Desserts / Pastries, Ice Cream, Soda (diet and regular), Sweet Tea, Candies, Chips, Cookies, Sweet Pastries, Store Bought Juices, Alcohol in Excess of 1-2 drinks a day, Artificial Sweeteners, Coffee Creamer, and "Sugar-free" Products. This will help patient to have stable blood glucose profile and potentially avoid unintended weight gain.   - I  encouraged the patient to switch to  unprocessed or minimally processed complex starch and increased protein intake (animal or plant source), fruits, and vegetables.   - Patient is advised to stick to a routine mealtimes to eat 3 meals a day and avoid unnecessary snacks (to snack only to correct hypoglycemia).  -He will continue to require intensive treatment with basal/bolus insulin in order for him to achieve and maintain control of diabetes to target.    -Due to significant hypoglycemia, he is advised to adjust his medications as follows: Decrease Toujeo to 80 units SQ nightly, continue Ozempic 1 mg SQ weekly, decrease Humalog to 15-21 units TID with meals if glucose is above 90 and he is eating (Specific instructions on how to titrate insulin dosage based on glucose readings given to patient in writing) and stop his Glipizide altogether.  Long term goal would be to maximize the Ozempic and lower his insulin treatment for simplicity purposes.  -He is encouraged to continue using his CGM to monitor glucose 4 times daily, before meals and before bed, and to call the clinic if he has readings less than 70 or greater than 200 for 3 tests in a row.  -Due to CKD patient is not a candidate for Metformin , SGLT2i.  -Target numbers for A1c, LDL, HDL, Triglycerides, Waist Circumference were discussed in detail.  2) Lipids/HPL:  His recent lipid panel from 10/02/21 shows controlled LDL of 73 and elevated triglycerides of 295.  He is advised to continue Crestor 5 mg p.o. daily at bedtime, Omega-3 fatty acid supplement, and Fenofibrate 160 mg p.o. daily.  Side effects and precautions discussed with him.  3) Hypothyroidism:   -There are no recent TFTs to review.  He is advised to continue his Levothyroxine 175 mcg po daily before breakfast.  Will recheck TFTs prior to next visit and adjust dose if needed.   - We discussed about the correct intake of his thyroid hormone, on empty stomach at fasting, with  water, separated by at least 30 minutes from breakfast and other medications,  and separated by more than 4 hours from calcium, iron, multivitamins, acid reflux medications (PPIs). -Patient is made aware of the fact that thyroid hormone replacement is needed for life, dose to be adjusted by periodic monitoring of thyroid function tests.  4) Hypertension/BP- His blood pressure is controlled to target.  He is advised to continue Losartan 50 mg po daily.  5) Vitamin D Deficiency His most recent vitamin D level was 22.2 on 10/02/21.  He takes an OTC supplement.  He could not remember to take his Ergocalciferol weekly, therefore he stopped it.  6) Hypercalcemia: His calcium level was 11.5 on 10/02/21, worsening from previous reading.  This is the second occurrence of high calcium levels.  He denies any personal or family history of thyroid cancer, adrenal problems, parathyroid problems, kidney stones, or fragility fractures.  His repeat calcium levels returned to normal of 10. Phosphorous and Magnesium was also WNL.  PTH was normal at 21, PTH-rp negative.  His urine studies were also unremarkable, ruling out [place].  Given his labs have returned to normal, no need for further investigation at this time.  He does have vitamin D deficiency, now on replenishment therapy with Ergocalciferol (which may have contributed to his high calcium previously).  He also has a history of mild renal impairment, currently stable GFR of 60 and creatinine of 1.29.        I advised patient to maintain close follow up with his PCP for primary care  needs.      I spent 43 minutes in the care of the patient today including review of labs from [place], Lipids, Thyroid Function, Hematology (current and previous including abstractions from other facilities); face-to-face time discussing  his blood glucose readings/logs, discussing hypoglycemia and hyperglycemia episodes and symptoms, medications doses, his options of short and long  term treatment based on the latest standards of care / guidelines;  discussion about incorporating lifestyle medicine;  and documenting the encounter.    Please refer to Patient Instructions for Blood Glucose Monitoring and Insulin/Medications Dosing Guide"  in media tab for additional information. Please  also refer to " Patient Self Inventory" in the Media  tab for reviewed elements of pertinent patient history.  [name] participated in the discussions, expressed understanding, and voiced agreement with the above plans.  All questions were answered to his satisfaction. he is encouraged to contact clinic should he have any questions or concerns prior to his return visit.    Follow up plan: Return in about 3 months (around 04/24/2022) for Diabetes F/U with A1c in office, Previsit labs, Bring meter and logs.   Rayetta Pigg, [facility] [address] [place], [place] [address] Phone: [phone] Fax: [phone]   01/22/2022, 4:08 PM

## 2022-01-22 NOTE — Patient Instructions (Signed)
Diabetes Mellitus and Foot Care Foot care is an important part of your health, especially when you have diabetes. Diabetes may cause you to have problems because of poor blood flow (circulation) to your feet and legs, which can cause your skin to: Become thinner and drier. Break more easily. Heal more slowly. Peel and crack. You may also have nerve damage (neuropathy) in your legs and feet, causing decreased feeling in them. This means that you may not notice minor injuries to your feet that could lead to more serious problems. Noticing and addressing any potential problems early is the best way to prevent future foot problems. How to care for your feet Foot hygiene  Wash your feet daily with warm water and mild soap. Do not use hot water. Then, pat your feet and the areas between your toes until they are completely dry. Do not soak your feet as this can dry your skin. Trim your toenails straight across. Do not dig under them or around the cuticle. File the edges of your nails with an emery board or nail file. Apply a moisturizing lotion or petroleum jelly to the skin on your feet and to dry, brittle toenails. Use lotion that does not contain alcohol and is unscented. Do not apply lotion between your toes. Shoes and socks Wear clean socks or stockings every day. Make sure they are not too tight. Do not wear knee-high stockings since they may decrease blood flow to your legs. Wear shoes that fit properly and have enough cushioning. Always look in your shoes before you put them on to be sure there are no objects inside. To break in new shoes, wear them for just a few hours a day. This prevents injuries on your feet. Wounds, scrapes, corns, and calluses  Check your feet daily for blisters, cuts, bruises, sores, and redness. If you cannot see the bottom of your feet, use a mirror or ask someone for help. Do not cut corns or calluses or try to remove them with medicine. If you find a minor scrape,  cut, or break in the skin on your feet, keep it and the skin around it clean and dry. You may clean these areas with mild soap and water. Do not clean the area with peroxide, alcohol, or iodine. If you have a wound, scrape, corn, or callus on your foot, look at it several times a day to make sure it is healing and not infected. Check for: Redness, swelling, or pain. Fluid or blood. Warmth. Pus or a bad smell. General tips Do not cross your legs. This may decrease blood flow to your feet. Do not use heating pads or hot water bottles on your feet. They may burn your skin. If you have lost feeling in your feet or legs, you may not know this is happening until it is too late. Protect your feet from hot and cold by wearing shoes, such as at the beach or on hot pavement. Schedule a complete foot exam at least once a year (annually) or more often if you have foot problems. Report any cuts, sores, or bruises to your health care provider immediately. Where to find more information American Diabetes Association: www.diabetes.org Association of Diabetes Care & Education Specialists: www.diabeteseducator.org Contact a health care provider if: You have a medical condition that increases your risk of infection and you have any cuts, sores, or bruises on your feet. You have an injury that is not healing. You have redness on your legs or feet. You   feel burning or tingling in your legs or feet. You have pain or cramps in your legs and feet. Your legs or feet are numb. Your feet always feel cold. You have pain around any toenails. Get help right away if: You have a wound, scrape, corn, or callus on your foot and: You have pain, swelling, or redness that gets worse. You have fluid or blood coming from the wound, scrape, corn, or callus. Your wound, scrape, corn, or callus feels warm to the touch. You have pus or a bad smell coming from the wound, scrape, corn, or callus. You have a fever. You have a red  line going up your leg. Summary Check your feet every day for blisters, cuts, bruises, sores, and redness. Apply a moisturizing lotion or petroleum jelly to the skin on your feet and to dry, brittle toenails. Wear shoes that fit properly and have enough cushioning. If you have foot problems, report any cuts, sores, or bruises to your health care provider immediately. Schedule a complete foot exam at least once a year (annually) or more often if you have foot problems. This information is not intended to replace advice given to you by your health care provider. Make sure you discuss any questions you have with your health care provider. Document Revised: 02/18/2020 Document Reviewed: 02/18/2020 Elsevier Patient Education  2023 [employer]  

## 2022-01-31 ENCOUNTER — Emergency Department (HOSPITAL_COMMUNITY): Payer: Medicare Other

## 2022-01-31 ENCOUNTER — Encounter (HOSPITAL_COMMUNITY): Payer: Self-pay | Admitting: Emergency Medicine

## 2022-01-31 ENCOUNTER — Emergency Department (HOSPITAL_COMMUNITY)
Admission: EM | Admit: 2022-01-31 | Discharge: 2022-01-31 | Disposition: A | Discharge status: Home / Self Care | Payer: Medicare Other | Attending: Emergency Medicine | Admitting: Emergency Medicine

## 2022-01-31 ENCOUNTER — Other Ambulatory Visit: Payer: Self-pay

## 2022-01-31 DIAGNOSIS — S63501A Unspecified sprain of right wrist, initial encounter: Secondary | ICD-10-CM | POA: Diagnosis not present

## 2022-01-31 DIAGNOSIS — Z7982 Long term (current) use of aspirin: Secondary | ICD-10-CM | POA: Insufficient documentation

## 2022-01-31 DIAGNOSIS — Y92002 Bathroom of unspecified non-institutional (private) residence single-family (private) house as the place of occurrence of the external cause: Secondary | ICD-10-CM | POA: Diagnosis not present

## 2022-01-31 DIAGNOSIS — Z794 Long term (current) use of insulin: Secondary | ICD-10-CM | POA: Diagnosis not present

## 2022-01-31 DIAGNOSIS — E119 Type 2 diabetes mellitus without complications: Secondary | ICD-10-CM | POA: Diagnosis not present

## 2022-01-31 DIAGNOSIS — S6991XA Unspecified injury of right wrist, hand and finger(s), initial encounter: Secondary | ICD-10-CM | POA: Diagnosis not present

## 2022-01-31 DIAGNOSIS — W010XXA Fall on same level from slipping, tripping and stumbling without subsequent striking against object, initial encounter: Secondary | ICD-10-CM | POA: Diagnosis not present

## 2022-01-31 MED ORDER — MELOXICAM 7.5 MG PO TABS: 7.5000 mg | ORAL_TABLET | Freq: Two times a day (BID) | ORAL | 0 refills | Status: AC | PRN

## 2022-01-31 NOTE — ED Provider Notes (Signed)
[facility] EMERGENCY DEPARTMENT Provider Note   CSN: [id number] Arrival date & time: 01/31/22  1230     History  Chief Complaint  Patient presents with   Hand Injury    [name] is a 66 y.o. male.   Hand Injury   This patient is a 66 year old male, history of some chronic pain for which she takes Dilaudid, history of diabetes for which she is on insulin, states that while his bathroom was getting remodeled he had some things on the floor that made it difficult to walk around the house and he tripped over them falling out and landing on his right outstretched hand.  This occurred 1 week ago, he initially had pain in the lateral hand but now it is moved over towards the distal radius and radiates to the mid forearm.  He denies any obvious deformities has no numbness or weakness of the hand but has pain ongoing for which she wants to be evaluated.  He has been taking Naprosyn and Tylenol without significant relief  Home Medications Prior to Admission medications   Medication Sig Start Date End Date Taking? Authorizing Provider  meloxicam (MOBIC) 7.5 MG tablet Take 1 tablet (7.5 mg total) by mouth 2 (two) times daily as needed for up to 14 days for pain. 01/31/22 02/14/22 Yes Eber Hong, MD  albuterol (VENTOLIN HFA) 108 (90 Base) MCG/ACT inhaler Inhale 1-2 puffs into the lungs every 6 (six) hours as needed for shortness of breath or wheezing. 03/05/20   [provider]  albuterol-ipratropium (COMBIVENT) 18-103 MCG/ACT inhaler Inhale 2 puffs into the lungs every 6 (six) hours as needed for wheezing or shortness of breath.     [provider]  alprazolam Prudy Feeler) 2 MG tablet alprazolam 2 mg tablet    [provider]  Ascorbic Acid (VITAMIN C) 1000 MG tablet Take 2,000 mg by mouth daily.    [provider]  aspirin 81 MG tablet Take 81 mg by mouth daily.      [provider]  B Complex-Biotin-FA (VITAMIN B50 COMPLEX PO) Take 1 capsule by  mouth daily.    [provider]  Cholecalciferol (VITAMIN D PO) Take 1 capsule by mouth daily.    [provider]  Continuous Blood Gluc Receiver (FREESTYLE LIBRE 2 READER) DEVI Use to monitor glucose 4 times daily 01/17/22   Dani Gobble, NP  Continuous Blood Gluc Sensor (FREESTYLE LIBRE 2 SENSOR) MISC Use to monitor glucose 4 times daily. 01/17/22   Dani Gobble, NP  Cyanocobalamin ([employer] B-12) 1000 MCG/15ML LIQD Place 15 mLs under the tongue 2 (two) times a week.    [provider]  cyclobenzaprine (FLEXERIL) 10 MG tablet Take 10 mg by mouth 3 (three) times daily as needed for muscle spasms.    [provider]  docusate sodium (COLACE) 100 MG capsule Take 200 mg by mouth daily.    [provider]  fenofibrate 160 MG tablet Take 160 mg by mouth daily. 06/29/21   [provider]  fish oil-omega-3 fatty acids 1000 MG capsule Take 2 g by mouth daily.    [provider]  gabapentin (NEURONTIN) 300 MG capsule Take 300 mg by mouth at bedtime. 04/18/21   [provider]  glucose blood (ACCU-CHEK AVIVA PLUS) test strip Use as directed to check blood glucose four times daily 03/07/20   Roma Kayser, MD  HYDROmorphone (DILAUDID) 4 MG tablet Take 4 mg by mouth 3 (three) times daily. 06/19/21  [provider]  insulin glargine, 2 Unit Dial, (TOUJEO MAX SOLOSTAR) 300 UNIT/ML Solostar Pen Inject 80 Units into the skin at bedtime. 01/22/22   Dani Gobble, NP  insulin lispro (HUMALOG) 100 UNIT/ML KwikPen Inject 15-21 Units into the skin 3 (three) times daily. 01/22/22   Dani Gobble, NP  levothyroxine (SYNTHROID) 175 MCG tablet TAKE 1 TABLET DAILY BEFORE BREAKFAST. 09/04/21   Dani Gobble, NP  losartan (COZAAR) 50 MG tablet Take 1 tablet (50 mg total) by mouth daily. KEEP OV. 11/01/21   Lewayne Bunting, MD  Multiple Vitamins-Minerals (MULTIVITAMIN WITH MINERALS) tablet Take 1 tablet by mouth daily.       [provider]  nitroGLYCERIN (NITROSTAT) 0.4 MG SL tablet Place 0.4 mg under the tongue every 5 (five) minutes as needed for chest pain. 05/05/20   [provider]  rosuvastatin (CRESTOR) 40 MG tablet Take 1 tablet (40 mg total) by mouth daily. KEEP OV. 11/01/21   Lewayne Bunting, MD  Semaglutide, 1 MG/DOSE, 4 MG/3ML SOPN Inject 1 mg as directed once a week. 01/22/22   Dani Gobble, NP  SURE COMFORT PEN NEEDLES 31G X 8 MM MISC USE AS DIRECTED WITH LANTUS AND NOVOLOG UP TO FOUR TIMES DAILY. 07/17/21   Roma Kayser, MD  Vitamin D, Ergocalciferol, (DRISDOL) 1.25 MG (50000 UNIT) CAPS capsule Take 1 capsule (50,000 Units total) by mouth every 7 (seven) days. Patient not taking: Reported on 01/22/2022 10/04/21   Dani Gobble, NP      Allergies    Alprazolam, Atorvastatin, Gemfibrozil, Invokana [canagliflozin], and Definity [perflutren lipid microsphere]    Review of Systems   Review of Systems  Musculoskeletal:  Positive for joint swelling.  Skin:  Negative for wound.  Neurological:  Negative for weakness and numbness.    Physical Exam Updated Vital Signs BP (!) 147/86 (BP Location: Left Arm)   Pulse 94   Temp 98.4 F (36.9 C) (Oral)   Resp 18   Ht 1.753 m (5\' 9" )   Wt 120 kg   SpO2 100%   BMI 39.07 kg/m  Physical Exam Vitals and nursing note reviewed.  Constitutional:      Appearance: He is well-developed. He is not diaphoretic.  HENT:     Head: Normocephalic and atraumatic.  Eyes:     General:        Right eye: No discharge.        Left eye: No discharge.     Conjunctiva/sclera: Conjunctivae normal.  Pulmonary:     Effort: Pulmonary effort is normal. No respiratory distress.  Musculoskeletal:        General: Tenderness present. No swelling or deformity.     Right lower leg: No edema.     Left lower leg: No edema.     Comments: Mild tenderness and swelling of the right distal forearm at the wrist, normal range of motion of passive range  of motion but there is some tenderness over the distal radius.  No obvious deformity, no open wounds, normal flexor and extensor function of all fingers including the thumb.  Skin:    General: Skin is warm and dry.     Findings: No erythema or rash.  Neurological:     Mental Status: He is alert.     Coordination: Coordination normal.     ED Results / Procedures / Treatments   Labs (all labs ordered are listed, but only abnormal results are displayed) Labs Reviewed - No data  to display  EKG None  Radiology DG Wrist Complete Right  Result Date: 01/31/2022 CLINICAL DATA:  Trauma, fall 1 week ago EXAM: RIGHT WRIST - COMPLETE 3+ VIEW COMPARISON:  None Available. FINDINGS: There is no evidence of fracture or dislocation. There is no evidence of arthropathy or other focal bone abnormality. Vascular calcinosis. IMPRESSION: No fracture or dislocation of the right wrist. Carpus is normally aligned. Electronically Signed   By: Delanna Ahmadi M.D.   On: 01/31/2022 13:06    Procedures Procedures    Medications Ordered in ED Medications - No data to display  ED Course/ Medical Decision Making/ A&P                           Medical Decision Making Amount and/or Complexity of Data Reviewed Radiology: ordered.  Risk Prescription drug management.   Exam is rather unremarkable, possible sprain or contusion, doubt radial fracture but will get imaging given the location of the tenderness.  Patient is otherwise well-appearing.  The patient tells me that he is on both Dilaudid and Valium and I have cautioned him against using these together as they are potentially pathological together.  He expressed his understanding  Radiology: I personally viewed and interpreted the x-rays of the wrist which show no signs of fractures or dislocations.  Medication management: Recommended Mobic as an outpatient, cautioned against other anti-inflammatory use with it.  ED course: Patient is stable, vital signs  unremarkable, patient agreeable to see Dr. In outpatient setting as needed        Final Clinical Impression(s) / ED Diagnoses Final diagnoses:  Wrist sprain, right, initial encounter    Rx / DC Orders ED Discharge Orders          Ordered    meloxicam (MOBIC) 7.5 MG tablet  2 times daily PRN        01/31/22 1309              Noemi Chapel, MD 01/31/22 1311

## 2022-01-31 NOTE — ED Triage Notes (Signed)
Pt tripped and fell 1 week ago landed on right hand. Pt has been treating at home with ice and tylenol and hand is still hurting in right wrist. Pt is moving right hand and fingers. Pulses presents. MD present during triage.

## 2022-01-31 NOTE — Discharge Instructions (Signed)
Your x-rays are normal and show no signs of broken bones or dislocations.  You likely do have a sprain of your wrist and should continue to use ice and an Ace wrap or something to keep it compressed and you may benefit from taping and anti-inflammatory like Mobic.  Please do not take Mobic with any other anti-inflammatory such as Aleve or ibuprofen.  Please take Mobic, 7.5 mg by mouth twice daily as needed for pain - this in an antiinflammatory medicine (NSAID) and is similar to ibuprofen - many people feel that it is stronger than ibuprofen and it is easier to take since it is a smaller pill.  Please use this only for 1 week - if your pain persists, you will need to follow up with your doctor in the office for ongoing guidance and pain control.  Thank you for allowing [place] to treat you in the emergency department today.  After reviewing your examination and potential testing that was done it appears that you are safe to go home.  I would like for you to follow-up with your doctor within the next several days, have them obtain your results and follow-up with them to review all of these tests.  If you should develop severe or worsening symptoms return to the emergency department immediately

## 2022-02-05 DIAGNOSIS — J449 Chronic obstructive pulmonary disease, unspecified: Secondary | ICD-10-CM | POA: Diagnosis not present

## 2022-02-05 DIAGNOSIS — I25119 Atherosclerotic heart disease of native coronary artery with unspecified angina pectoris: Secondary | ICD-10-CM | POA: Diagnosis not present

## 2022-02-05 DIAGNOSIS — N183 Chronic kidney disease, stage 3 unspecified: Secondary | ICD-10-CM | POA: Diagnosis not present

## 2022-02-05 DIAGNOSIS — M5136 Other intervertebral disc degeneration, lumbar region: Secondary | ICD-10-CM | POA: Diagnosis not present

## 2022-02-05 DIAGNOSIS — E1165 Type 2 diabetes mellitus with hyperglycemia: Secondary | ICD-10-CM | POA: Diagnosis not present

## 2022-02-05 DIAGNOSIS — Z0001 Encounter for general adult medical examination with abnormal findings: Secondary | ICD-10-CM | POA: Diagnosis not present

## 2022-02-26 ENCOUNTER — Other Ambulatory Visit: Payer: Self-pay | Admitting: Nurse Practitioner

## 2022-02-28 ENCOUNTER — Telehealth: Payer: Self-pay | Admitting: Cardiology

## 2022-02-28 ENCOUNTER — Other Ambulatory Visit: Payer: Self-pay

## 2022-02-28 DIAGNOSIS — E7800 Pure hypercholesterolemia, unspecified: Secondary | ICD-10-CM

## 2022-02-28 DIAGNOSIS — I10 Essential (primary) hypertension: Secondary | ICD-10-CM

## 2022-02-28 MED ORDER — ROSUVASTATIN CALCIUM 40 MG PO TABS: 40.0000 mg | ORAL_TABLET | Freq: Every day | ORAL | 1 refills | Status: DC

## 2022-02-28 MED ORDER — LOSARTAN POTASSIUM 50 MG PO TABS: 50.0000 mg | ORAL_TABLET | Freq: Every day | ORAL | 1 refills | Status: DC

## 2022-02-28 NOTE — Telephone Encounter (Signed)
Refill sent to pharmacy.   

## 2022-02-28 NOTE — Telephone Encounter (Signed)
Called pt to let him know his prescriptions have been sent into his pharmacy.

## 2022-02-28 NOTE — Telephone Encounter (Signed)
*  STAT* If patient is at the pharmacy, call can be transferred to refill team.   1. Which medications need to be refilled? (please list name of each medication and dose if known)  rosuvastatin (CRESTOR) 40 MG tablet losartan (COZAAR) 50 MG tablet  2. Which pharmacy/location (including street and city if local pharmacy) is medication to be sent to? [facility] - [place], [place] - [address]  3. Do they need a 30 day or 90 day supply?   Patient is completely out of medication. He will need enough to last until 12/07 appointment.  7/28 appointment for refills was cancelled by the provider. Please assist.

## 2022-03-05 DIAGNOSIS — J449 Chronic obstructive pulmonary disease, unspecified: Secondary | ICD-10-CM | POA: Diagnosis not present

## 2022-03-05 DIAGNOSIS — E1165 Type 2 diabetes mellitus with hyperglycemia: Secondary | ICD-10-CM | POA: Diagnosis not present

## 2022-03-05 DIAGNOSIS — I10 Essential (primary) hypertension: Secondary | ICD-10-CM | POA: Diagnosis not present

## 2022-03-05 DIAGNOSIS — R413 Other amnesia: Secondary | ICD-10-CM | POA: Diagnosis not present

## 2022-03-05 DIAGNOSIS — N183 Chronic kidney disease, stage 3 unspecified: Secondary | ICD-10-CM | POA: Diagnosis not present

## 2022-03-05 DIAGNOSIS — I25119 Atherosclerotic heart disease of native coronary artery with unspecified angina pectoris: Secondary | ICD-10-CM | POA: Diagnosis not present

## 2022-03-05 DIAGNOSIS — M5136 Other intervertebral disc degeneration, lumbar region: Secondary | ICD-10-CM | POA: Diagnosis not present

## 2022-03-09 ENCOUNTER — Ambulatory Visit: Payer: Medicare Other | Admitting: Cardiology

## 2022-03-22 DIAGNOSIS — E559 Vitamin D deficiency, unspecified: Secondary | ICD-10-CM | POA: Diagnosis not present

## 2022-03-22 DIAGNOSIS — D518 Other vitamin B12 deficiency anemias: Secondary | ICD-10-CM | POA: Diagnosis not present

## 2022-03-22 DIAGNOSIS — E782 Mixed hyperlipidemia: Secondary | ICD-10-CM | POA: Diagnosis not present

## 2022-03-22 DIAGNOSIS — E039 Hypothyroidism, unspecified: Secondary | ICD-10-CM | POA: Diagnosis not present

## 2022-03-22 DIAGNOSIS — Z0001 Encounter for general adult medical examination with abnormal findings: Secondary | ICD-10-CM | POA: Diagnosis not present

## 2022-03-22 DIAGNOSIS — E1159 Type 2 diabetes mellitus with other circulatory complications: Secondary | ICD-10-CM | POA: Diagnosis not present

## 2022-03-23 LAB — COMPREHENSIVE METABOLIC PANEL
ALT: 20 IU/L (ref 0–44)
AST: 25 IU/L (ref 0–40)
Albumin/Globulin Ratio: 2 (ref 1.2–2.2)
Albumin: 4.3 g/dL (ref 3.9–4.9)
Alkaline Phosphatase: 42 IU/L — ABNORMAL LOW (ref 44–121)
BUN/Creatinine Ratio: 20 (ref 10–24)
BUN: 30 mg/dL — ABNORMAL HIGH (ref 8–27)
Bilirubin Total: 0.3 mg/dL (ref 0.0–1.2)
CO2: 26 mmol/L (ref 20–29)
Calcium: 9.4 mg/dL (ref 8.6–10.2)
Chloride: 102 mmol/L (ref 96–106)
Creatinine, Ser: 1.52 mg/dL — ABNORMAL HIGH (ref 0.76–1.27)
Globulin, Total: 2.1 g/dL (ref 1.5–4.5)
Glucose: 74 mg/dL (ref 70–99)
Potassium: 4.4 mmol/L (ref 3.5–5.2)
Sodium: 141 mmol/L (ref 134–144)
Total Protein: 6.4 g/dL (ref 6.0–8.5)
eGFR: 50 mL/min/{1.73_m2} — ABNORMAL LOW (ref >59)

## 2022-03-23 LAB — T4, FREE: Free T4: 1.37 ng/dL (ref 0.82–1.77)

## 2022-03-23 LAB — TSH: TSH: 0.451 u[IU]/mL (ref 0.450–4.500)

## 2022-04-03 ENCOUNTER — Telehealth: Payer: Self-pay | Admitting: *Deleted

## 2022-04-03 NOTE — Patient Outreach (Signed)
  Care Coordination   04/03/2022 Name: [name] MRN: [id number] DOB: May 17, 1956   Care Coordination Outreach Attempts:  An unsuccessful telephone outreach was attempted today to offer the patient information about available care coordination services as a benefit of their health plan.   Follow Up Plan:  Additional outreach attempts will be made to offer the patient care coordination information and services.   Encounter Outcome:  No Answer  Care Coordination Interventions Activated:  No   Care Coordination Interventions:  No, not indicated    Demetrios Loll, BSN, RN-BC RN Care Coordinator Direct Dial: [phone]

## 2022-04-05 DIAGNOSIS — E1122 Type 2 diabetes mellitus with diabetic chronic kidney disease: Secondary | ICD-10-CM | POA: Diagnosis not present

## 2022-04-05 DIAGNOSIS — I10 Essential (primary) hypertension: Secondary | ICD-10-CM | POA: Diagnosis not present

## 2022-04-05 DIAGNOSIS — N183 Chronic kidney disease, stage 3 unspecified: Secondary | ICD-10-CM | POA: Diagnosis not present

## 2022-04-05 DIAGNOSIS — J449 Chronic obstructive pulmonary disease, unspecified: Secondary | ICD-10-CM | POA: Diagnosis not present

## 2022-04-05 DIAGNOSIS — I25119 Atherosclerotic heart disease of native coronary artery with unspecified angina pectoris: Secondary | ICD-10-CM | POA: Diagnosis not present

## 2022-04-05 DIAGNOSIS — E039 Hypothyroidism, unspecified: Secondary | ICD-10-CM | POA: Diagnosis not present

## 2022-04-05 DIAGNOSIS — R5383 Other fatigue: Secondary | ICD-10-CM | POA: Diagnosis not present

## 2022-04-05 DIAGNOSIS — E1165 Type 2 diabetes mellitus with hyperglycemia: Secondary | ICD-10-CM | POA: Diagnosis not present

## 2022-04-10 ENCOUNTER — Encounter: Payer: Self-pay | Admitting: *Deleted

## 2022-04-10 ENCOUNTER — Telehealth: Payer: Self-pay | Admitting: *Deleted

## 2022-04-10 NOTE — Patient Outreach (Signed)
  Care Coordination   Initial Visit Note   04/10/2022 Name: [name] MRN: [id number] DOB: 30-Apr-1956  [name] is a 66 y.o. year old male who sees Alan Nevins, MD for primary care. I spoke with  [name] by phone today.  What matters to the patients health and wellness today?  Ongoing self health maintenance    Goals Addressed             This Visit's Progress    Care Coordination Services (no follow-up required)       Care Coordination Interventions: Reviewed medications with patient and discussed affordability Provided patient and/or caregiver with verbal information about [facility] Care Coordination Service (community resource) Advised patient to discuss AWV with provider Assessed social determinant of health barriers Discussed mobility and ability to perform ADLs       SDOH assessments and interventions completed:  Yes{THN Tip this will not be part of the note when signed-REQUIRED REPORT FIELD DO NOT DELETE (Optional):27901}  SDOH Interventions Today    Flowsheet Row Most Recent Value  SDOH Interventions   Financial Strain Interventions Intervention Not Indicated  Housing Interventions Intervention Not Indicated  Transportation Interventions Intervention Not Indicated      Care Coordination Interventions Activated:  Yes {THN Tip this will not be part of the note when signed-REQUIRED REPORT FIELD DO NOT DELETE (Optional):27901} Care Coordination Interventions:  Yes, provided {THN Tip this will not be part of the note when signed-REQUIRED REPORT FIELD DO NOT DELETE (Optional):27901}  Follow up plan: No further intervention required.   Encounter Outcome:  Pt. Visit Completed {THN Tip this will not be part of the note when signed-REQUIRED REPORT FIELD DO NOT DELETE (Optional):27901}  Demetrios Loll, BSN, RN-BC RN Care Coordinator [facility] / [employer] Dial: [phone]

## 2022-04-11 ENCOUNTER — Other Ambulatory Visit: Payer: Self-pay | Admitting: Nurse Practitioner

## 2022-04-16 ENCOUNTER — Other Ambulatory Visit: Payer: Self-pay | Admitting: Nurse Practitioner

## 2022-04-18 DIAGNOSIS — D649 Anemia, unspecified: Secondary | ICD-10-CM | POA: Diagnosis not present

## 2022-04-24 ENCOUNTER — Encounter: Payer: Self-pay | Admitting: Nurse Practitioner

## 2022-04-24 ENCOUNTER — Ambulatory Visit (INDEPENDENT_AMBULATORY_CARE_PROVIDER_SITE_OTHER): Payer: Medicare Other | Admitting: Nurse Practitioner

## 2022-04-24 VITALS — BP 100/60 | HR 88 | Ht 69.0 in | Wt 252.2 lb

## 2022-04-24 DIAGNOSIS — E782 Mixed hyperlipidemia: Secondary | ICD-10-CM | POA: Diagnosis not present

## 2022-04-24 DIAGNOSIS — E039 Hypothyroidism, unspecified: Secondary | ICD-10-CM | POA: Diagnosis not present

## 2022-04-24 DIAGNOSIS — E559 Vitamin D deficiency, unspecified: Secondary | ICD-10-CM | POA: Diagnosis not present

## 2022-04-24 DIAGNOSIS — I10 Essential (primary) hypertension: Secondary | ICD-10-CM

## 2022-04-24 DIAGNOSIS — E1159 Type 2 diabetes mellitus with other circulatory complications: Secondary | ICD-10-CM

## 2022-04-24 MED ORDER — TOUJEO MAX SOLOSTAR 300 UNIT/ML ~~LOC~~ SOPN: 60.0000 [IU] | PEN_INJECTOR | Freq: Every day | SUBCUTANEOUS | 3 refills | Status: DC

## 2022-04-24 MED ORDER — LEVOTHYROXINE SODIUM 175 MCG PO TABS: 175.0000 ug | ORAL_TABLET | Freq: Every day | ORAL | 1 refills | Status: DC

## 2022-04-24 NOTE — Patient Instructions (Signed)

## 2022-04-24 NOTE — Progress Notes (Signed)
04/24/2022   Endocrinology follow-up note   Subjective:    Patient ID: [name], male    DOB: [birth year],    Past Medical History:  Diagnosis Date   ASCVD (arteriosclerotic cardiovascular disease)    -MI in 01/2001 prompted CABG; EF-30% at cath; 50% on echo in 7/02 and normal in 2005   Asthma    Cerebrovascular disease     L CEA 06/2004 following left renal embolism; 10/2008 plaque w/o focal stenosis   Chronic back pain    Chronic leg pain    Chronic obstructive pulmonary disease ([facility])    Degenerative joint disease     chronic LBP-s/p L3-4 fusion   Diabetes mellitus    -no insulin   Hyperlipidemia    Hypertension    Hypothyroidism    Obesity    Restless leg syndrome    Possible   Tobacco abuse, in remission    50 pack years; discontinued in 2002   Past Surgical History:  Procedure Laterality Date   CAROTID ENDARTERECTOMY  08/14/2003   Left   CATARACT EXTRACTION     bilateral   CHOLECYSTECTOMY N/A 02/06/2021   Procedure: LAPAROSCOPIC CHOLECYSTECTOMY;  Surgeon: Aviva Signs, MD;  Location: [facility];  Service: General;  Laterality: N/A;   CORONARY ARTERY BYPASS GRAFT  08/13/2000   GALLBLADDER SURGERY  02/07/2021   LUMBAR SPINE SURGERY     L3-4 fusion   Social History   Socioeconomic History   Marital status: Divorced    Spouse name: Not on file   Number of children: 1   Years of education: Not on file   Highest education level: Not on file  Occupational History   Occupation: veteran    Comment: disabledf  Tobacco Use   Smoking status: Former    Packs/day: 1.00    Years: 50.00    Total pack years: 50.00    Types: Cigarettes    Quit date: 01/15/2001    Years since quitting: 21.2   Smokeless tobacco: Never  Vaping Use   Vaping Use: Never used  Substance and Sexual Activity   Alcohol use: No   Drug use: No   Sexual activity: Not Currently  Other Topics Concern   Not on file  Social History Narrative   Not on file   Social Determinants of  Health   Financial Resource Strain: Low Risk  (04/10/2022)   Overall Financial Resource Strain (CARDIA)    Difficulty of Paying Living Expenses: Not hard at all  Food Insecurity: Not on file  Transportation Needs: No Transportation Needs (04/10/2022)   PRAPARE - Transportation    Lack of Transportation (Medical): No    Lack of Transportation (Non-Medical): No  Physical Activity: Not on file  Stress: Not on file  Social Connections: Not on file   Outpatient Encounter Medications as of 04/24/2022  Medication Sig   albuterol (VENTOLIN HFA) 108 (90 Base) MCG/ACT inhaler Inhale 1-2 puffs into the lungs every 6 (six) hours as needed for shortness of breath or wheezing.   albuterol-ipratropium (COMBIVENT) 18-103 MCG/ACT inhaler Inhale 2 puffs into the lungs every 6 (six) hours as needed for wheezing or shortness of breath.    Ascorbic Acid (VITAMIN C) 1000 MG tablet Take 1,000 mg by mouth daily.   aspirin 81 MG tablet Take 81 mg by mouth daily.     B Complex-Biotin-FA (VITAMIN B50 COMPLEX PO) Take 1 capsule by mouth daily.   Cholecalciferol (VITAMIN D PO) Take 1 capsule by mouth daily.  Continuous Blood Gluc Receiver (FREESTYLE LIBRE 2 READER) DEVI Use to monitor glucose 4 times daily   Continuous Blood Gluc Sensor (FREESTYLE LIBRE 2 SENSOR) MISC Use to monitor glucose 4 times daily.   cyclobenzaprine (FLEXERIL) 10 MG tablet Take 10 mg by mouth 3 (three) times daily as needed for muscle spasms.   docusate sodium (COLACE) 100 MG capsule Take 200 mg by mouth daily.   fenofibrate 160 MG tablet Take 160 mg by mouth daily.   fish oil-omega-3 fatty acids 1000 MG capsule Take 2 g by mouth daily.   glucose blood (ACCU-CHEK AVIVA PLUS) test strip Use as directed to check blood glucose four times daily   HYDROmorphone (DILAUDID) 4 MG tablet Take 4 mg by mouth 3 (three) times daily.   losartan (COZAAR) 50 MG tablet Take 1 tablet (50 mg total) by mouth daily. KEEP OV.   Multiple Vitamins-Minerals  (MULTIVITAMIN WITH MINERALS) tablet Take 1 tablet by mouth daily.     nitroGLYCERIN (NITROSTAT) 0.4 MG SL tablet Place 0.4 mg under the tongue every 5 (five) minutes as needed for chest pain.   rosuvastatin (CRESTOR) 40 MG tablet Take 1 tablet (40 mg total) by mouth daily. KEEP OV.   Semaglutide, 1 MG/DOSE, 4 MG/3ML SOPN Inject 1 mg as directed once a week.   SURE COMFORT PEN NEEDLES 31G X 8 MM MISC USE AS DIRECTED WITH LANTUS AND NOVOLOG UP TO FOUR TIMES DAILY.   [DISCONTINUED] insulin lispro (HUMALOG) 100 UNIT/ML KwikPen Inject 15-21 Units into the skin 3 (three) times daily.   [DISCONTINUED] TOUJEO MAX SOLOSTAR 300 UNIT/ML Solostar Pen INJECT 80 UNITS INTO THE SKIN AT BEDTIME.   insulin glargine, 2 Unit Dial, (TOUJEO MAX SOLOSTAR) 300 UNIT/ML Solostar Pen Inject 60 Units into the skin at bedtime.   levothyroxine (SYNTHROID) 175 MCG tablet Take 1 tablet (175 mcg total) by mouth daily before breakfast.   Vitamin D, Ergocalciferol, (DRISDOL) 1.25 MG (50000 UNIT) CAPS capsule Take 1 capsule (50,000 Units total) by mouth every 7 (seven) days. (Patient not taking: Reported on 04/24/2022)   [DISCONTINUED] alprazolam (XANAX) 2 MG tablet alprazolam 2 mg tablet (Patient not taking: Reported on 04/24/2022)   [DISCONTINUED] Cyanocobalamin ([employer] B-12) 1000 MCG/15ML LIQD Place 15 mLs under the tongue 2 (two) times a week. (Patient not taking: Reported on 04/24/2022)   [DISCONTINUED] gabapentin (NEURONTIN) 300 MG capsule Take 300 mg by mouth at bedtime. (Patient not taking: Reported on 04/24/2022)   [DISCONTINUED] levothyroxine (SYNTHROID) 175 MCG tablet TAKE 1 TABLET DAILY BEFORE BREAKFAST.   No facility-administered encounter medications on file as of 04/24/2022.   ALLERGIES: Allergies  Allergen Reactions   Alprazolam Nausea And Vomiting and Other (See Comments)    Altered mental status   Atorvastatin Nausea And Vomiting   Gemfibrozil     unknown   Invokana [Canagliflozin] Diarrhea and Nausea And Vomiting    Definity [Perflutren Lipid Microsphere] Other (See Comments)    Headache   VACCINATION STATUS: Immunization History  Administered Date(s) Administered   Influenza-Unspecified 05/13/2014    Diabetes He presents for his follow-up diabetic visit. He has type 2 diabetes mellitus. Onset time: He was diagnosed at approximate age of 18 years. His disease course has been stable. There are no hypoglycemic associated symptoms. Pertinent negatives for hypoglycemia include no confusion, headaches or pallor. Associated symptoms include foot paresthesias. Pertinent negatives for diabetes include no fatigue, no polydipsia, no polyphagia, no polyuria and no weight loss. There are no hypoglycemic complications. Symptoms are stable. Diabetic complications include  a CVA, heart disease, nephropathy and peripheral neuropathy. Risk factors for coronary artery disease include diabetes mellitus, dyslipidemia, hypertension, male sex, tobacco exposure, sedentary lifestyle and obesity. Current diabetic treatment includes intensive insulin program (and Ozempic). He is compliant with treatment most of the time. His weight is decreasing steadily. He is following a generally healthy diet. When asked about meal planning, he reported none. He has had a previous visit with a dietitian. He participates in exercise intermittently. His home blood glucose trend is decreasing rapidly. (He presents today with his CGM showing tight glycemic profile overall.  His POCT A1c today is 5.8%, improving from last visit of 6.4%.  He does have frequent hypoglycemia.  Analysis of his CGM shows TIR 88%, TAR 0%, TBR 12%, with a GMI of 5.5%.) An ACE inhibitor/angiotensin II receptor blocker is being taken. He does not see a podiatrist.Eye exam is current.  Hyperlipidemia This is a chronic problem. The current episode started more than 1 year ago. The problem is uncontrolled. Recent lipid tests were reviewed and are variable. Exacerbating diseases include  diabetes, hypothyroidism and obesity. He has no history of chronic renal disease. Factors aggravating his hyperlipidemia include fatty foods. Associated symptoms include myalgias. Current antihyperlipidemic treatment includes statins. The current treatment provides moderate improvement of lipids. Compliance problems include adherence to diet and adherence to exercise.  Risk factors for coronary artery disease include family history, dyslipidemia, diabetes mellitus, hypertension, male sex, a sedentary lifestyle and obesity.  Thyroid Problem Presents for follow-up (He has longstanding hypothyroidism, currently on levothyroxine 150 mcg p.o. daily before breakfast.  He reports compliance with medication.) visit. Patient reports no cold intolerance, constipation, depressed mood, diarrhea, fatigue, heat intolerance, palpitations, weight gain or weight loss. The symptoms have been stable. His past medical history is significant for diabetes and hyperlipidemia.     Review of systems  Constitutional: + steadily decreasing body weight,  current Body mass index is 37.24 kg/m. , + fatigue, no subjective hyperthermia, no subjective hypothermia Eyes: no blurry vision, no xerophthalmia ENT: no sore throat, no nodules palpated in throat, no dysphagia/odynophagia, no hoarseness Cardiovascular: no chest pain, no shortness of breath, no palpitations, no leg swelling Respiratory: no cough, no shortness of breath Gastrointestinal: no nausea/vomiting/diarrhea Musculoskeletal: diffuse muscle/joint aches Skin: no rashes, no hyperemia Neurological: no tremors, no numbness, no tingling, no dizziness Psychiatric: no depression, no anxiety   Objective:    BP 100/60 (BP Location: Left Arm, Patient Position: Sitting, Cuff Size: Large)   Pulse 88   Ht _0  (1.753 m)   Wt 252 lb 3.2 oz (114.4 kg)   BMI 37.24 kg/m   Wt Readings from Last 3 Encounters:  04/24/22 252 lb 3.2 oz (114.4 kg)  01/31/22 264 lb 8.8 oz (120  kg)  01/22/22 265 lb (120.2 kg)   BP Readings from Last 3 Encounters:  04/24/22 100/60  01/31/22 109/66  01/22/22 (!) 158/85     Physical Exam- Limited  Constitutional:  Body mass index is 37.24 kg/m. , not in acute distress, normal state of mind Eyes:  EOMI, no exophthalmos Neck: Supple Cardiovascular: RRR, no murmurs, rubs, or gallops, no edema Respiratory: Adequate breathing efforts, no crackles, rales, rhonchi, or wheezing Musculoskeletal: no gross deformities, strength intact in all four extremities, no gross restriction of joint movements Skin:  no rashes, no hyperemia Neurological: no tremor with outstretched hands   Diabetic Foot Exam - Simple   No data filed     Results for orders placed or performed in  visit on 01/22/22  Comprehensive metabolic panel  Result Value Ref Range   Glucose 74 70 - 99 mg/dL   BUN 30 (H) 8 - 27 mg/dL   Creatinine, Ser 1.52 (H) 0.76 - 1.27 mg/dL   eGFR 50 (L) >59 mL/min/1.73   BUN/Creatinine Ratio 20 10 - 24   Sodium 141 134 - 144 mmol/L   Potassium 4.4 3.5 - 5.2 mmol/L   Chloride 102 96 - 106 mmol/L   CO2 26 20 - 29 mmol/L   Calcium 9.4 8.6 - 10.2 mg/dL   Total Protein 6.4 6.0 - 8.5 g/dL   Albumin 4.3 3.9 - 4.9 g/dL   Globulin, Total 2.1 1.5 - 4.5 g/dL   Albumin/Globulin Ratio 2.0 1.2 - 2.2   Bilirubin Total 0.3 0.0 - 1.2 mg/dL   Alkaline Phosphatase 42 (L) 44 - 121 IU/L   AST 25 0 - 40 IU/L   ALT 20 0 - 44 IU/L  TSH  Result Value Ref Range   TSH 0.451 0.450 - 4.500 uIU/mL  T4, free  Result Value Ref Range   Free T4 1.37 0.82 - 1.77 ng/dL  HgB A1c  Result Value Ref Range   Hemoglobin A1C     HbA1c POC (<> result, manual entry)     HbA1c, POC (prediabetic range)     HbA1c, POC (controlled diabetic range) 6.4 0.0 - 7.0 %  POCT UA - Microalbumin  Result Value Ref Range   Microalbumin Ur, POC 80 mg/L   Creatinine, POC 200 mg/dL   Albumin/Creatinine Ratio, Urine, POC 30-300    Lab Results  Component Value Date   WBC  11.9 (H) 01/31/2021   HGB 14.1 01/31/2021   HCT 43.5 01/31/2021   MCV 91.6 01/31/2021   PLT 227 01/31/2021    Lab Results  Component Value Date   HGBA1C 6.4 01/22/2022   HGBA1C 8.6 (A) 10/04/2021   HGBA1C 8.2 (A) 07/04/2021   MICROALBUR 80 01/22/2022   MICROALBUR 77.4 02/26/2020     Lipid Panel     Component Value Date/Time   CHOL 172 10/02/2021 1419   TRIG 295 (H) 10/02/2021 1419   TRIG 329 10/02/2007 0000   HDL 38 (L) 10/02/2021 1419   CHOLHDL 4.5 10/02/2021 1419   CHOLHDL 4.9 02/26/2020 1002   VLDL 19 09/09/2018 0949   LDLCALC 85 10/02/2021 1419   LDLCALC 136 (H) 02/26/2020 1002   LDLCALC 66 10/02/2007 0000     Assessment & Plan:   1) Type 2 diabetes mellitus with stage 3 chronic kidney disease, with long-term current use of insulin   He presents today with his CGM showing tight glycemic profile overall.  His POCT A1c today is 5.8%, improving from last visit of 6.4%.  He does have frequent hypoglycemia.  Analysis of his CGM shows TIR 88%, TAR 0%, TBR 12%, with a GMI of 5.5%.   Recent labs reviewed.  - His diabetes is complicated by CAD, CKD, CVA, Retinopathy. Patient remains at a high risk for more acute and chronic complications of diabetes which include CAD, CVA, CKD, retinopathy, and neuropathy. These are all discussed in detail with the patient.  - Nutritional counseling repeated at each appointment due to patients tendency to fall back in to old habits.  - The patient admits there is a room for improvement in their diet and drink choices. -  Suggestion is made for the patient to avoid simple carbohydrates from their diet including Cakes, Sweet Desserts / Pastries, Ice Cream, Soda (diet  and regular), Sweet Tea, Candies, Chips, Cookies, Sweet Pastries, Store Bought Juices, Alcohol in Excess of 1-2 drinks a day, Artificial Sweeteners, Coffee Creamer, and "Sugar-free" Products. This will help patient to have stable blood glucose profile and potentially avoid unintended  weight gain.   - I encouraged the patient to switch to unprocessed or minimally processed complex starch and increased protein intake (animal or plant source), fruits, and vegetables.   - Patient is advised to stick to a routine mealtimes to eat 3 meals a day and avoid unnecessary snacks (to snack only to correct hypoglycemia).  -He will continue to require intensive treatment with basal/bolus insulin in order for him to achieve and maintain control of diabetes to target.    -Due to significant hypoglycemia, he is advised to adjust his medications as follows: Decrease Toujeo to 60 units SQ nightly, continue Ozempic 1 mg SQ weekly, and stop his Humalog altogether.   -He is encouraged to continue using his CGM to monitor glucose 4 times daily, before meals and before bed, and to call the clinic if he has readings less than 70 or greater than 200 for 3 tests in a row.  -Due to CKD patient is not a candidate for Metformin , SGLT2i.  -Target numbers for A1c, LDL, HDL, Triglycerides, Waist Circumference were discussed in detail.  2) Lipids/HPL:  His recent lipid panel from 10/02/21 shows controlled LDL of 73 and elevated triglycerides of 295.  He is advised to continue Crestor 5 mg p.o. daily at bedtime, Omega-3 fatty acid supplement, and Fenofibrate 160 mg p.o. daily.  Side effects and precautions discussed with him.  3) Hypothyroidism:   -His previsit thyroid function tests are consistent with appropriate hormone replacement.  He is advised to continue his Levothyroxine 175 mcg po daily before breakfast.     - We discussed about the correct intake of his thyroid hormone, on empty stomach at fasting, with water, separated by at least 30 minutes from breakfast and other medications,  and separated by more than 4 hours from calcium, iron, multivitamins, acid reflux medications (PPIs). -Patient is made aware of the fact that thyroid hormone replacement is needed for life, dose to be adjusted by  periodic monitoring of thyroid function tests.  4) Hypertension/BP- His blood pressure is controlled to target.  He is advised to continue Losartan 50 mg po daily.  5) Vitamin D Deficiency His most recent vitamin D level was 22.2 on 10/02/21.  He takes an OTC supplement.  He could not remember to take his Ergocalciferol weekly, therefore he stopped it.  6) Hypercalcemia: His calcium level was 11.5 on 10/02/21, worsening from previous reading.  This is the second occurrence of high calcium levels.  He denies any personal or family history of thyroid cancer, adrenal problems, parathyroid problems, kidney stones, or fragility fractures.  His repeat calcium levels returned to normal of 10. Phosphorous and Magnesium was also WNL.  PTH was normal at 21, PTH-rp negative.  His urine studies were also unremarkable, ruling out Clarinda.  Given his labs have returned to normal, no need for further investigation at this time.  He does have vitamin D deficiency, now on replenishment therapy with Ergocalciferol (which may have contributed to his high calcium previously).  He also has a history of mild renal impairment, currently stable GFR of 60 and creatinine of 1.29.        I advised patient to maintain close follow up with his PCP for primary care needs.  I spent 41 minutes in the care of the patient today including review of labs from [facility], Lipids, Thyroid Function, Hematology (current and previous including abstractions from other facilities); face-to-face time discussing  his blood glucose readings/logs, discussing hypoglycemia and hyperglycemia episodes and symptoms, medications doses, his options of short and long term treatment based on the latest standards of care / guidelines;  discussion about incorporating lifestyle medicine;  and documenting the encounter. Risk reduction counseling performed per USPSTF guidelines to reduce obesity and cardiovascular risk factors.     Please refer to Patient  Instructions for Blood Glucose Monitoring and Insulin/Medications Dosing Guide"  in media tab for additional information. Please  also refer to " Patient Self Inventory" in the Media  tab for reviewed elements of pertinent patient history.  [name] participated in the discussions, expressed understanding, and voiced agreement with the above plans.  All questions were answered to his satisfaction. he is encouraged to contact clinic should he have any questions or concerns prior to his return visit.    Follow up plan: Return in about 4 months (around 08/24/2022) for Diabetes F/U with A1c in office, No previsit labs, Bring meter and logs.   Rayetta Pigg, [facility] [address] [place], [place] [address] Phone: [phone] Fax: [phone]   04/24/2022, 3:18 PM

## 2022-05-03 DIAGNOSIS — N183 Chronic kidney disease, stage 3 unspecified: Secondary | ICD-10-CM | POA: Diagnosis not present

## 2022-05-03 DIAGNOSIS — I10 Essential (primary) hypertension: Secondary | ICD-10-CM | POA: Diagnosis not present

## 2022-05-03 DIAGNOSIS — J449 Chronic obstructive pulmonary disease, unspecified: Secondary | ICD-10-CM | POA: Diagnosis not present

## 2022-05-03 DIAGNOSIS — M1991 Primary osteoarthritis, unspecified site: Secondary | ICD-10-CM | POA: Diagnosis not present

## 2022-05-03 DIAGNOSIS — I25119 Atherosclerotic heart disease of native coronary artery with unspecified angina pectoris: Secondary | ICD-10-CM | POA: Diagnosis not present

## 2022-05-03 DIAGNOSIS — E1165 Type 2 diabetes mellitus with hyperglycemia: Secondary | ICD-10-CM | POA: Diagnosis not present

## 2022-05-03 DIAGNOSIS — Z23 Encounter for immunization: Secondary | ICD-10-CM | POA: Diagnosis not present

## 2022-06-01 DIAGNOSIS — G894 Chronic pain syndrome: Secondary | ICD-10-CM | POA: Diagnosis not present

## 2022-06-01 DIAGNOSIS — N183 Chronic kidney disease, stage 3 unspecified: Secondary | ICD-10-CM | POA: Diagnosis not present

## 2022-06-01 DIAGNOSIS — I10 Essential (primary) hypertension: Secondary | ICD-10-CM | POA: Diagnosis not present

## 2022-06-01 DIAGNOSIS — J449 Chronic obstructive pulmonary disease, unspecified: Secondary | ICD-10-CM | POA: Diagnosis not present

## 2022-06-01 DIAGNOSIS — E1165 Type 2 diabetes mellitus with hyperglycemia: Secondary | ICD-10-CM | POA: Diagnosis not present

## 2022-06-01 DIAGNOSIS — M1991 Primary osteoarthritis, unspecified site: Secondary | ICD-10-CM | POA: Diagnosis not present

## 2022-06-12 DIAGNOSIS — E119 Type 2 diabetes mellitus without complications: Secondary | ICD-10-CM | POA: Diagnosis not present

## 2022-07-03 DIAGNOSIS — G894 Chronic pain syndrome: Secondary | ICD-10-CM | POA: Diagnosis not present

## 2022-07-03 DIAGNOSIS — N183 Chronic kidney disease, stage 3 unspecified: Secondary | ICD-10-CM | POA: Diagnosis not present

## 2022-07-03 DIAGNOSIS — G720 Drug-induced myopathy: Secondary | ICD-10-CM | POA: Diagnosis not present

## 2022-07-03 DIAGNOSIS — M1991 Primary osteoarthritis, unspecified site: Secondary | ICD-10-CM | POA: Diagnosis not present

## 2022-07-03 DIAGNOSIS — J449 Chronic obstructive pulmonary disease, unspecified: Secondary | ICD-10-CM | POA: Diagnosis not present

## 2022-07-03 DIAGNOSIS — I10 Essential (primary) hypertension: Secondary | ICD-10-CM | POA: Diagnosis not present

## 2022-07-03 DIAGNOSIS — E1122 Type 2 diabetes mellitus with diabetic chronic kidney disease: Secondary | ICD-10-CM | POA: Diagnosis not present

## 2022-07-03 DIAGNOSIS — M5136 Other intervertebral disc degeneration, lumbar region: Secondary | ICD-10-CM | POA: Diagnosis not present

## 2022-07-16 NOTE — Progress Notes (Signed)
HPI: Follow-up coronary artery disease.  Previously followed by Dr. Purvis Sheffield.  Patient is status post coronary artery bypass and graft in 2002.  Patient is status post carotid endarterectomy.   Nuclear study February 2020 showed ejection fraction 56%, anterior scar but no ischemia.  Echocardiogram November 2021 showed normal LV function, moderate left ventricular hypertrophy, apical/anteroseptal akinesis.  Carotid Dopplers November 2021 showed 1 to 39% bilateral stenosis.  ABIs November 2021 1.09 on the left and 0.92 on the right.  Since last seen he denies dyspnea, chest pain, palpitations or syncope.  Current Outpatient Medications  Medication Sig Dispense Refill   albuterol (VENTOLIN HFA) 108 (90 Base) MCG/ACT inhaler Inhale 1-2 puffs into the lungs every 6 (six) hours as needed for shortness of breath or wheezing.     albuterol-ipratropium (COMBIVENT) 18-103 MCG/ACT inhaler Inhale 2 puffs into the lungs every 6 (six) hours as needed for wheezing or shortness of breath.      Ascorbic Acid (VITAMIN C) 1000 MG tablet Take 1,000 mg by mouth daily.     aspirin 81 MG tablet Take 81 mg by mouth daily.       B Complex-Biotin-FA (VITAMIN B50 COMPLEX PO) Take 1 capsule by mouth daily.     Cholecalciferol (VITAMIN D PO) Take 1 capsule by mouth daily.     Continuous Blood Gluc Receiver (FREESTYLE LIBRE 2 READER) DEVI Use to monitor glucose 4 times daily 1 each 0   Continuous Blood Gluc Sensor (FREESTYLE LIBRE 2 SENSOR) MISC Use to monitor glucose 4 times daily. 6 each 3   cyclobenzaprine (FLEXERIL) 10 MG tablet Take 10 mg by mouth 3 (three) times daily as needed for muscle spasms.     diazepam (VALIUM) 10 MG tablet Take 5 mg by mouth at bedtime as needed for anxiety.     docusate sodium (COLACE) 100 MG capsule Take 200 mg by mouth daily.     fenofibrate 160 MG tablet Take 160 mg by mouth daily.     fish oil-omega-3 fatty acids 1000 MG capsule Take 2 g by mouth daily.     gabapentin (NEURONTIN) 300  MG capsule Take 300 mg by mouth at bedtime.     glucose blood (ACCU-CHEK AVIVA PLUS) test strip Use as directed to check blood glucose four times daily 400 strip 1   HYDROmorphone (DILAUDID) 4 MG tablet Take 4 mg by mouth 3 (three) times daily.     insulin glargine, 2 Unit Dial, (TOUJEO MAX SOLOSTAR) 300 UNIT/ML Solostar Pen Inject 60 Units into the skin at bedtime. 15 mL 3   levothyroxine (SYNTHROID) 175 MCG tablet Take 1 tablet (175 mcg total) by mouth daily before breakfast. 90 tablet 1   losartan (COZAAR) 50 MG tablet Take 1 tablet (50 mg total) by mouth daily. KEEP OV. 90 tablet 1   Multiple Vitamins-Minerals (MULTIVITAMIN WITH MINERALS) tablet Take 1 tablet by mouth daily.       nitroGLYCERIN (NITROSTAT) 0.4 MG SL tablet Place 0.4 mg under the tongue every 5 (five) minutes as needed for chest pain.     rosuvastatin (CRESTOR) 40 MG tablet Take 1 tablet (40 mg total) by mouth daily. KEEP OV. 90 tablet 1   Semaglutide, 1 MG/DOSE, 4 MG/3ML SOPN Inject 1 mg as directed once a week. 6 mL 3   SURE COMFORT PEN NEEDLES 31G X 8 MM MISC USE AS DIRECTED WITH LANTUS AND NOVOLOG UP TO FOUR TIMES DAILY. 400 each 0   Vitamin D, Ergocalciferol, (DRISDOL) 1.25  MG (50000 UNIT) CAPS capsule Take 1 capsule (50,000 Units total) by mouth every 7 (seven) days. 12 capsule 0   No current facility-administered medications for this visit.     Past Medical History:  Diagnosis Date   ASCVD (arteriosclerotic cardiovascular disease)    -MI in 01/2001 prompted CABG; EF-30% at cath; 50% on echo in 7/02 and normal in 2005   Asthma    Cerebrovascular disease     L CEA 06/2004 following left renal embolism; 10/2008 plaque w/o focal stenosis   Chronic back pain    Chronic leg pain    Chronic obstructive pulmonary disease (HCC)    Degenerative joint disease     chronic LBP-s/p L3-4 fusion   Diabetes mellitus    -no insulin   Hyperlipidemia    Hypertension    Hypothyroidism    Obesity    Restless leg syndrome     Possible   Tobacco abuse, in remission    50 pack years; discontinued in 2002    Past Surgical History:  Procedure Laterality Date   CAROTID ENDARTERECTOMY  08/14/2003   Left   CATARACT EXTRACTION     bilateral   CHOLECYSTECTOMY N/A 02/06/2021   Procedure: LAPAROSCOPIC CHOLECYSTECTOMY;  Surgeon: Franky Macho, MD;  Location: [facility];  Service: General;  Laterality: N/A;   CORONARY ARTERY BYPASS GRAFT  08/13/2000   GALLBLADDER SURGERY  02/07/2021   LUMBAR SPINE SURGERY     L3-4 fusion    Social History   Socioeconomic History   Marital status: Divorced    Spouse name: Not on file   Number of children: 1   Years of education: Not on file   Highest education level: Not on file  Occupational History   Occupation: veteran    Comment: disabledf  Tobacco Use   Smoking status: Former    Packs/day: 1.00    Years: 50.00    Total pack years: 50.00    Types: Cigarettes    Quit date: 01/15/2001    Years since quitting: 21.5   Smokeless tobacco: Never  Vaping Use   Vaping Use: Never used  Substance and Sexual Activity   Alcohol use: No   Drug use: No   Sexual activity: Not Currently  Other Topics Concern   Not on file  Social History Narrative   Not on file   Social Determinants of Health   Financial Resource Strain: Low Risk  (04/10/2022)   Overall Financial Resource Strain (CARDIA)    Difficulty of Paying Living Expenses: Not hard at all  Food Insecurity: Not on file  Transportation Needs: No Transportation Needs (04/10/2022)   PRAPARE - Transportation    Lack of Transportation (Medical): No    Lack of Transportation (Non-Medical): No  Physical Activity: Not on file  Stress: Not on file  Social Connections: Not on file  Intimate Partner Violence: Not on file    History reviewed. No pertinent family history.  ROS: Chronic fatigue but no fevers or chills, productive cough, hemoptysis, dysphasia, odynophagia, melena, hematochezia, dysuria, hematuria, rash, seizure  activity, orthopnea, PND, pedal edema, claudication. Remaining systems are negative.  Physical Exam: Well-developed well-nourished in no acute distress.  Skin is warm and dry.  HEENT is normal.  Neck is supple.  Chest is clear to auscultation with normal expansion.  Cardiovascular exam is regular rate and rhythm.  Abdominal exam nontender or distended. No masses palpated. Extremities show no edema. neuro grossly intact  ECG-normal sinus rhythm at a rate of 90, cannot  rule out septal infarct.  Personally reviewed  A/P  1 coronary artery disease status post coronary artery bypass and graft-patient doing well with no chest pain.  Continue aspirin and statin.  2 hypertension-blood pressure controlled.  Continue present medications.  Check potassium and renal function.  3 hyperlipidemia-continue Crestor.  Check lipids and liver.  4 status post carotid endarterectomy-we will arrange follow-up carotid Dopplers.  Olga Millers, MD

## 2022-07-19 ENCOUNTER — Ambulatory Visit: Payer: Medicare Other | Admitting: Cardiology

## 2022-07-24 ENCOUNTER — Encounter: Payer: Self-pay | Admitting: Cardiology

## 2022-07-24 ENCOUNTER — Ambulatory Visit: Payer: Medicare Other | Attending: Cardiology | Admitting: Cardiology

## 2022-07-24 VITALS — BP 142/70 | HR 90 | Ht 69.0 in | Wt 241.8 lb

## 2022-07-24 DIAGNOSIS — I10 Essential (primary) hypertension: Secondary | ICD-10-CM | POA: Diagnosis not present

## 2022-07-24 DIAGNOSIS — E7800 Pure hypercholesterolemia, unspecified: Secondary | ICD-10-CM | POA: Diagnosis not present

## 2022-07-24 DIAGNOSIS — I2510 Atherosclerotic heart disease of native coronary artery without angina pectoris: Secondary | ICD-10-CM

## 2022-07-24 DIAGNOSIS — I679 Cerebrovascular disease, unspecified: Secondary | ICD-10-CM | POA: Diagnosis not present

## 2022-07-24 NOTE — Patient Instructions (Signed)
  Testing/Procedures:  Your physician has requested that you have a carotid duplex. This test is an ultrasound of the carotid arteries in your neck. It looks at blood flow through these arteries that supply the brain with blood. Allow one hour for this exam. There are no restrictions or special instructions. [facility]   Follow-Up: At [facility], you and your health needs are our priority.  As part of our continuing mission to provide you with exceptional heart care, we have created designated Provider Care Teams.  These Care Teams include your primary Cardiologist (physician) and Advanced Practice Providers (APPs -  Physician Assistants and Nurse Practitioners) who all work together to provide you with the care you need, when you need it.  We recommend signing up for the patient portal called "MyChart".  Sign up information is provided on this After Visit Summary.  MyChart is used to connect with patients for Virtual Visits (Telemedicine).  Patients are able to view lab/test results, encounter notes, upcoming appointments, etc.  Non-urgent messages can be sent to your provider as well.   To learn more about what you can do with MyChart, go to [email or web address].    Your next appointment:   12 month(s)  The format for your next appointment:   In Person  Provider:   Olga Millers MD

## 2022-07-27 IMAGING — US US ABDOMEN LIMITED
2 series · 14 of 25 positions shown · non-contrast
Comparison: None.

CLINICAL DATA: 64-year-old male with right upper quadrant abdominal
pain.

EXAM:
ULTRASOUND ABDOMEN LIMITED RIGHT UPPER QUADRANT

[Series 1: us abdomen limited · 13 of 79 slices shown]
[im 1/79]
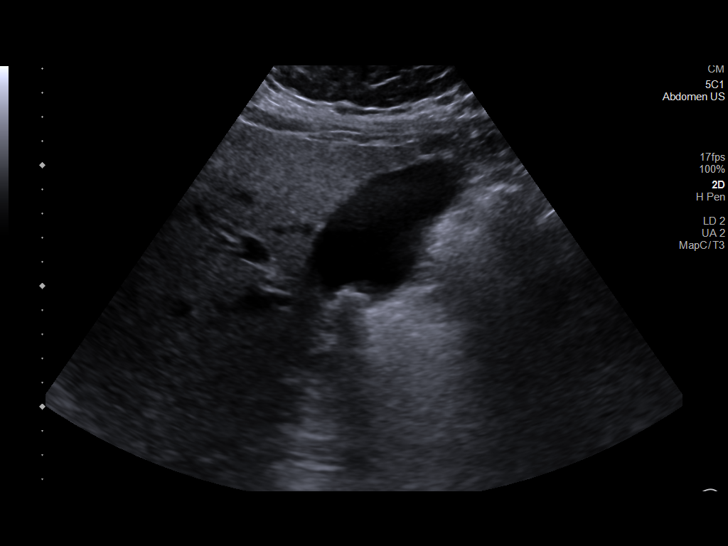
[im 7/79]
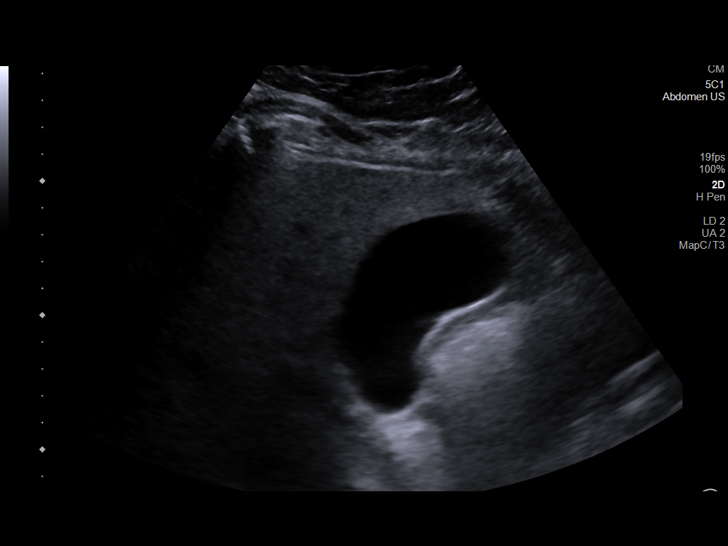
[im 14/79]
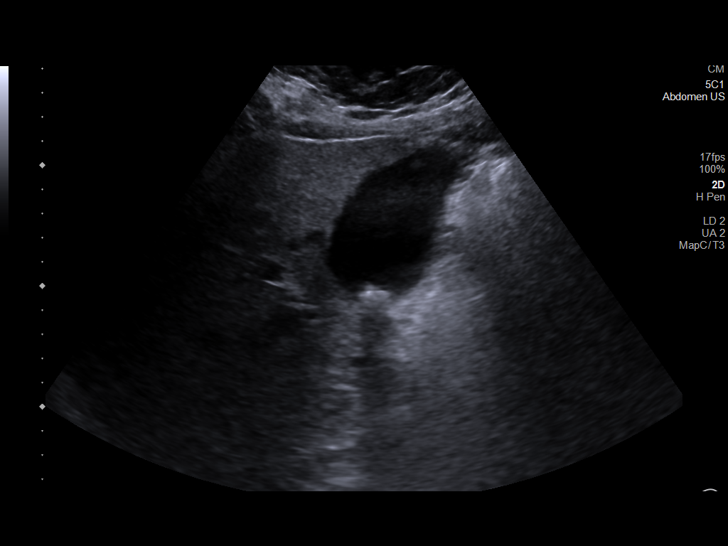
[im 21/79]
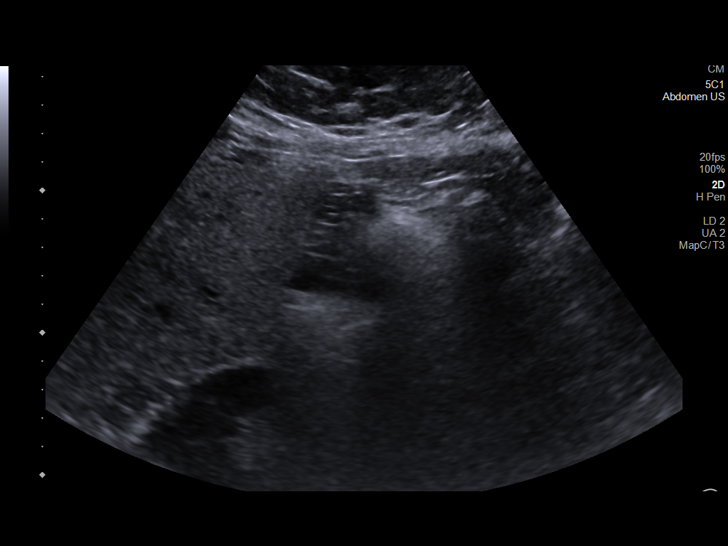
[im 28/79]
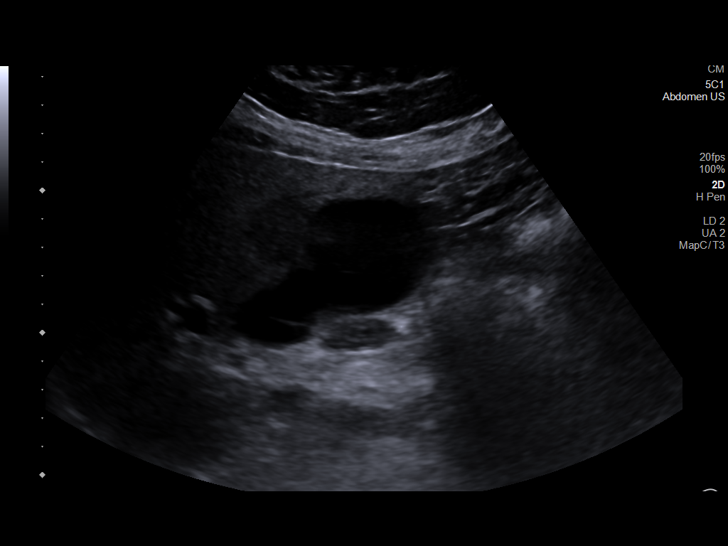
[im 31/79]
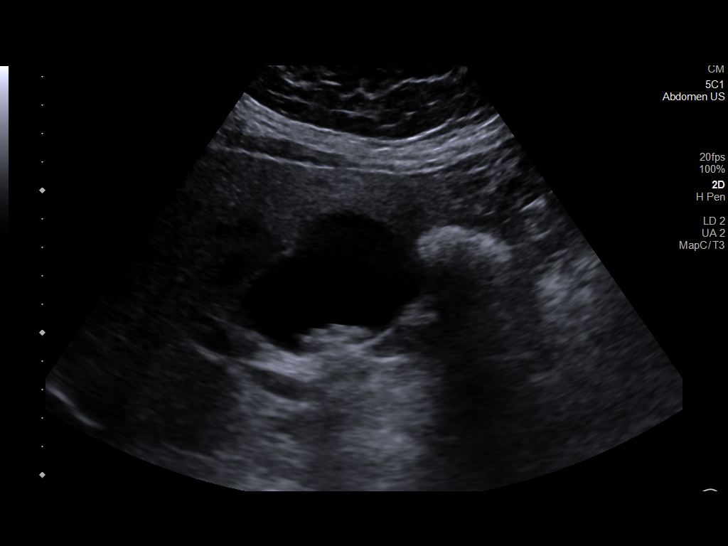
[im 38/79]
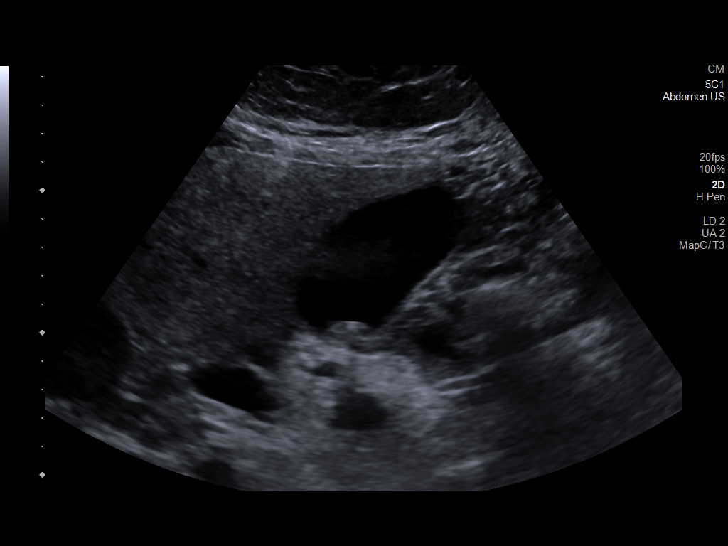
[im 45/79]
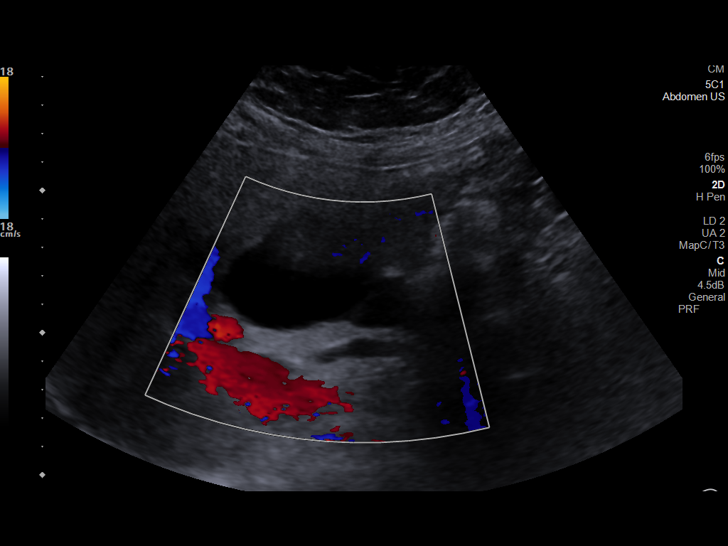
[im 51/79]
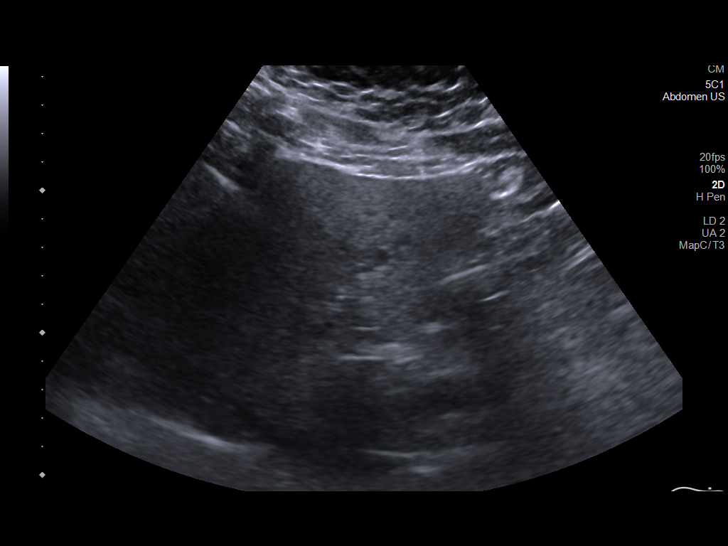
[im 55/79]
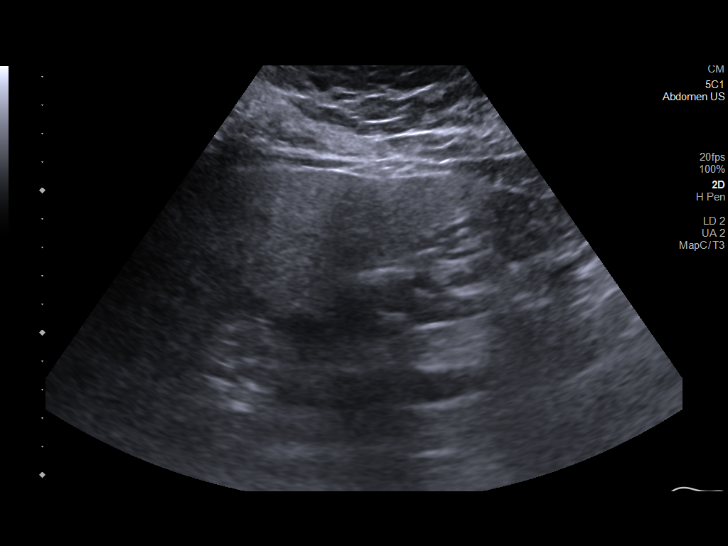
[im 62/79]
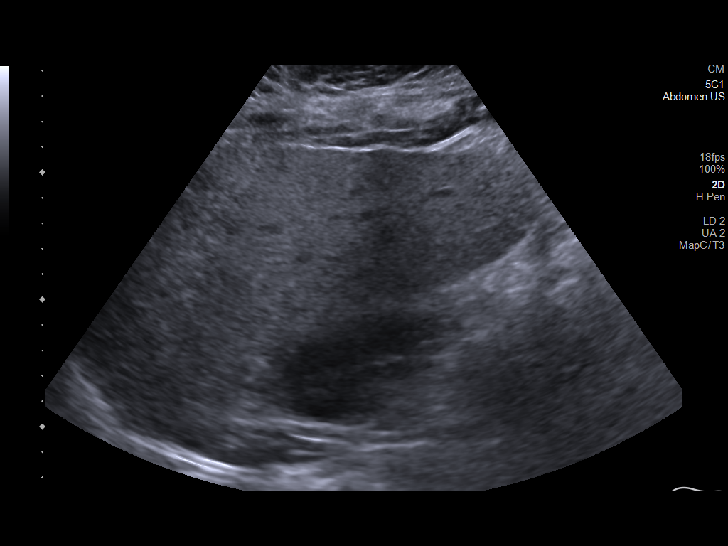
[im 68/79]
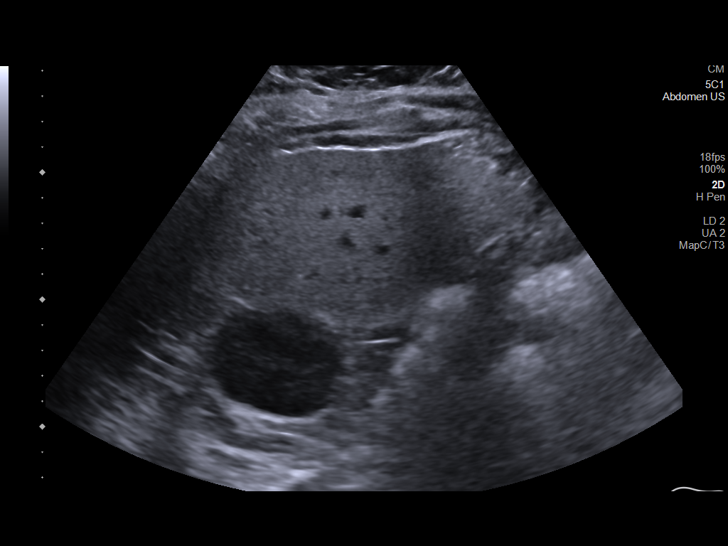
[im 75/79]
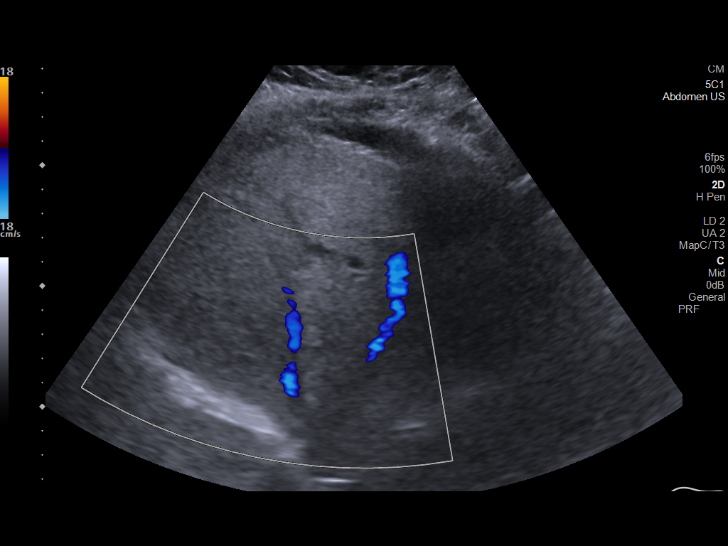

[Series 1001: abdomen us · 1 of 1 slices shown]
[im 1/1]
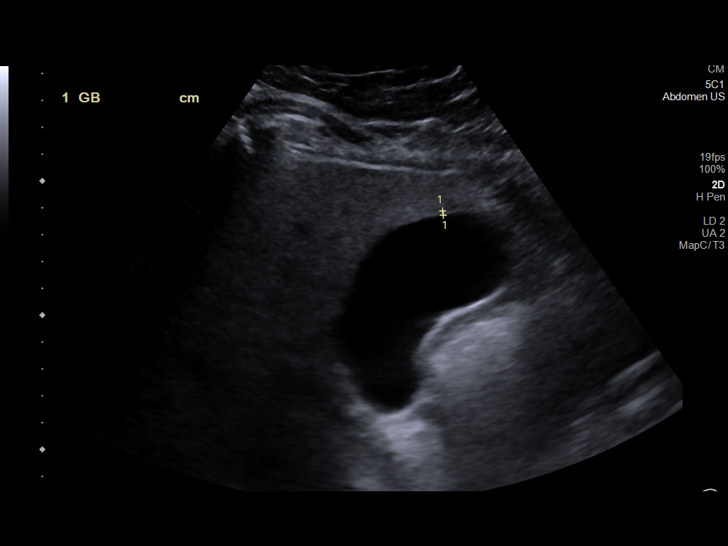

[14 of 25 positions shown; findings below may reference images not displayed]

FINDINGS: Gallbladder:

There are several stones within gallbladder. No gallbladder wall
thickening or pericholecystic fluid. Negative sonographic Murphy's
sign.

Common bile duct:

Diameter: 6 mm

Liver:

There is diffuse increased liver echogenicity most commonly seen in
the setting of fatty infiltration. Superimposed inflammation or
fibrosis is not excluded. Clinical correlation is recommended.
Portal vein is patent on color Doppler imaging with normal direction
of blood flow towards the liver.

Other: None.
IMPRESSION: 1. Cholelithiasis without sonographic evidence of acute
cholecystitis.
2. Fatty liver.

## 2022-08-02 DIAGNOSIS — M5412 Radiculopathy, cervical region: Secondary | ICD-10-CM | POA: Diagnosis not present

## 2022-08-02 DIAGNOSIS — M1991 Primary osteoarthritis, unspecified site: Secondary | ICD-10-CM | POA: Diagnosis not present

## 2022-08-02 DIAGNOSIS — J449 Chronic obstructive pulmonary disease, unspecified: Secondary | ICD-10-CM | POA: Diagnosis not present

## 2022-08-02 DIAGNOSIS — I2089 Other forms of angina pectoris: Secondary | ICD-10-CM | POA: Diagnosis not present

## 2022-08-02 DIAGNOSIS — G894 Chronic pain syndrome: Secondary | ICD-10-CM | POA: Diagnosis not present

## 2022-08-02 DIAGNOSIS — I10 Essential (primary) hypertension: Secondary | ICD-10-CM | POA: Diagnosis not present

## 2022-08-02 DIAGNOSIS — N1831 Chronic kidney disease, stage 3a: Secondary | ICD-10-CM | POA: Diagnosis not present

## 2022-08-02 DIAGNOSIS — E1165 Type 2 diabetes mellitus with hyperglycemia: Secondary | ICD-10-CM | POA: Diagnosis not present

## 2022-08-02 DIAGNOSIS — E1122 Type 2 diabetes mellitus with diabetic chronic kidney disease: Secondary | ICD-10-CM | POA: Diagnosis not present

## 2022-08-10 ENCOUNTER — Ambulatory Visit: Payer: Medicare Other | Attending: Cardiology

## 2022-08-10 DIAGNOSIS — I679 Cerebrovascular disease, unspecified: Secondary | ICD-10-CM

## 2022-08-10 DIAGNOSIS — I6789 Other cerebrovascular disease: Secondary | ICD-10-CM | POA: Diagnosis not present

## 2022-08-13 ENCOUNTER — Other Ambulatory Visit: Payer: Self-pay | Admitting: Nurse Practitioner

## 2022-08-14 ENCOUNTER — Encounter: Payer: Self-pay | Admitting: *Deleted

## 2022-08-22 NOTE — Patient Instructions (Signed)

## 2022-08-24 ENCOUNTER — Encounter: Payer: Self-pay | Admitting: Nurse Practitioner

## 2022-08-24 ENCOUNTER — Ambulatory Visit (INDEPENDENT_AMBULATORY_CARE_PROVIDER_SITE_OTHER): Payer: Medicare Other | Admitting: Nurse Practitioner

## 2022-08-24 VITALS — BP 95/62 | HR 94 | Ht 69.0 in | Wt 242.0 lb

## 2022-08-24 DIAGNOSIS — I10 Essential (primary) hypertension: Secondary | ICD-10-CM | POA: Diagnosis not present

## 2022-08-24 DIAGNOSIS — E039 Hypothyroidism, unspecified: Secondary | ICD-10-CM

## 2022-08-24 DIAGNOSIS — E559 Vitamin D deficiency, unspecified: Secondary | ICD-10-CM

## 2022-08-24 DIAGNOSIS — E782 Mixed hyperlipidemia: Secondary | ICD-10-CM

## 2022-08-24 DIAGNOSIS — E1159 Type 2 diabetes mellitus with other circulatory complications: Secondary | ICD-10-CM

## 2022-08-24 LAB — POCT GLYCOSYLATED HEMOGLOBIN (HGB A1C): Hemoglobin A1C: 6.1 % — AB (ref 4.0–5.6)

## 2022-08-24 MED ORDER — TOUJEO MAX SOLOSTAR 300 UNIT/ML ~~LOC~~ SOPN: 50.0000 [IU] | PEN_INJECTOR | Freq: Every day | SUBCUTANEOUS | 0 refills | Status: DC

## 2022-08-24 NOTE — Progress Notes (Signed)
08/24/2022   Endocrinology follow-up note   Subjective:    Patient ID: [name], male    DOB: [birth year],    Past Medical History:  Diagnosis Date   ASCVD (arteriosclerotic cardiovascular disease)    -MI in 01/2001 prompted CABG; EF-30% at cath; 50% on echo in 7/02 and normal in 2005   Asthma    Cerebrovascular disease     L CEA 06/2004 following left renal embolism; 10/2008 plaque w/o focal stenosis   Chronic back pain    Chronic leg pain    Chronic obstructive pulmonary disease ([place])    Degenerative joint disease     chronic LBP-s/p L3-4 fusion   Diabetes mellitus    -no insulin   Hyperlipidemia    Hypertension    Hypothyroidism    Obesity    Restless leg syndrome    Possible   Tobacco abuse, in remission    50 pack years; discontinued in 2002   Past Surgical History:  Procedure Laterality Date   CAROTID ENDARTERECTOMY  08/14/2003   Left   CATARACT EXTRACTION     bilateral   CHOLECYSTECTOMY N/A 02/06/2021   Procedure: LAPAROSCOPIC CHOLECYSTECTOMY;  Surgeon: Aviva Signs, MD;  Location: [facility];  Service: General;  Laterality: N/A;   CORONARY ARTERY BYPASS GRAFT  08/13/2000   GALLBLADDER SURGERY  02/07/2021   LUMBAR SPINE SURGERY     L3-4 fusion   Social History   Socioeconomic History   Marital status: Divorced    Spouse name: Not on file   Number of children: 1   Years of education: Not on file   Highest education level: Not on file  Occupational History   Occupation: veteran    Comment: disabledf  Tobacco Use   Smoking status: Former    Packs/day: 1.00    Years: 50.00    Total pack years: 50.00    Types: Cigarettes    Quit date: 01/15/2001    Years since quitting: 21.6   Smokeless tobacco: Never  Vaping Use   Vaping Use: Never used  Substance and Sexual Activity   Alcohol use: No   Drug use: No   Sexual activity: Not Currently  Other Topics Concern   Not on file  Social History Narrative   Not on file   Social Determinants of  Health   Financial Resource Strain: Low Risk  (04/10/2022)   Overall Financial Resource Strain (CARDIA)    Difficulty of Paying Living Expenses: Not hard at all  Food Insecurity: Not on file  Transportation Needs: No Transportation Needs (04/10/2022)   PRAPARE - Transportation    Lack of Transportation (Medical): No    Lack of Transportation (Non-Medical): No  Physical Activity: Not on file  Stress: Not on file  Social Connections: Not on file   Outpatient Encounter Medications as of 08/24/2022  Medication Sig   albuterol (VENTOLIN HFA) 108 (90 Base) MCG/ACT inhaler Inhale 1-2 puffs into the lungs every 6 (six) hours as needed for shortness of breath or wheezing.   albuterol-ipratropium (COMBIVENT) 18-103 MCG/ACT inhaler Inhale 2 puffs into the lungs every 6 (six) hours as needed for wheezing or shortness of breath.    Ascorbic Acid (VITAMIN C) 1000 MG tablet Take 1,000 mg by mouth daily.   aspirin 81 MG tablet Take 81 mg by mouth daily.     B Complex-Biotin-FA (VITAMIN B50 COMPLEX PO) Take 1 capsule by mouth daily.   Cholecalciferol (VITAMIN D PO) Take 1 capsule by mouth daily.  Continuous Blood Gluc Receiver (FREESTYLE LIBRE 2 READER) DEVI Use to monitor glucose 4 times daily   Continuous Blood Gluc Sensor (FREESTYLE LIBRE 2 SENSOR) MISC Use to monitor glucose 4 times daily.   diazepam (VALIUM) 10 MG tablet Take 5 mg by mouth at bedtime as needed for anxiety.   docusate sodium (COLACE) 100 MG capsule Take 200 mg by mouth daily.   fenofibrate 160 MG tablet Take 160 mg by mouth daily.   fish oil-omega-3 fatty acids 1000 MG capsule Take 2 g by mouth daily.   gabapentin (NEURONTIN) 300 MG capsule Take 300 mg by mouth at bedtime.   glucose blood (ACCU-CHEK AVIVA PLUS) test strip Use as directed to check blood glucose four times daily   HYDROmorphone (DILAUDID) 4 MG tablet Take 4 mg by mouth 3 (three) times daily.   levothyroxine (SYNTHROID) 175 MCG tablet Take 1 tablet (175 mcg total) by  mouth daily before breakfast.   losartan (COZAAR) 50 MG tablet Take 1 tablet (50 mg total) by mouth daily. KEEP OV.   Multiple Vitamins-Minerals (MULTIVITAMIN WITH MINERALS) tablet Take 1 tablet by mouth daily.     nitroGLYCERIN (NITROSTAT) 0.4 MG SL tablet Place 0.4 mg under the tongue every 5 (five) minutes as needed for chest pain.   OVER THE COUNTER MEDICATION Vitamin B 50 - patient reports that he takes daily.   rosuvastatin (CRESTOR) 40 MG tablet Take 1 tablet (40 mg total) by mouth daily. KEEP OV.   Semaglutide, 1 MG/DOSE, 4 MG/3ML SOPN Inject 1 mg as directed once a week.   SURE COMFORT PEN NEEDLES 31G X 8 MM MISC USE AS DIRECTED WITH LANTUS AND NOVOLOG UP TO FOUR TIMES DAILY.   [DISCONTINUED] insulin glargine, 2 Unit Dial, (TOUJEO MAX SOLOSTAR) 300 UNIT/ML Solostar Pen Inject 60 Units into the skin at bedtime.   insulin glargine, 2 Unit Dial, (TOUJEO MAX SOLOSTAR) 300 UNIT/ML Solostar Pen Inject 50 Units into the skin at bedtime.   [DISCONTINUED] cyclobenzaprine (FLEXERIL) 10 MG tablet Take 10 mg by mouth 3 (three) times daily as needed for muscle spasms. (Patient not taking: Reported on 08/24/2022)   [DISCONTINUED] Vitamin D, Ergocalciferol, (DRISDOL) 1.25 MG (50000 UNIT) CAPS capsule Take 1 capsule (50,000 Units total) by mouth every 7 (seven) days. (Patient not taking: Reported on 08/24/2022)   No facility-administered encounter medications on file as of 08/24/2022.   ALLERGIES: Allergies  Allergen Reactions   Alprazolam Nausea And Vomiting and Other (See Comments)    Altered mental status   Atorvastatin Nausea And Vomiting   Gemfibrozil     unknown   Invokana [Canagliflozin] Diarrhea and Nausea And Vomiting   Definity [Perflutren Lipid Microsphere] Other (See Comments)    Headache   VACCINATION STATUS: Immunization History  Administered Date(s) Administered   Influenza-Unspecified 05/13/2014    Diabetes He presents for his follow-up diabetic visit. He has type 2 diabetes  mellitus. Onset time: He was diagnosed at approximate age of 15 years. His disease course has been improving. There are no hypoglycemic associated symptoms. Pertinent negatives for hypoglycemia include no confusion, headaches or pallor. Associated symptoms include foot paresthesias. Pertinent negatives for diabetes include no fatigue, no polydipsia, no polyphagia, no polyuria and no weight loss. There are no hypoglycemic complications. Symptoms are stable. Diabetic complications include a CVA, heart disease, nephropathy and peripheral neuropathy. Risk factors for coronary artery disease include diabetes mellitus, dyslipidemia, hypertension, male sex, tobacco exposure, sedentary lifestyle and obesity. Current diabetic treatment includes insulin injections (and Ozempic). He is  compliant with treatment most of the time. His weight is fluctuating minimally. He is following a generally healthy diet. When asked about meal planning, he reported none. He has had a previous visit with a dietitian. He participates in exercise intermittently. His home blood glucose trend is decreasing steadily. (He presents today with his CGM showing at target glycemic profile overall.  His POCT A1c today is 6.1%, improving from last visit of 6.4%.  Analysis of his CGM shows TIR 92%, TAR 5%, TBR 3% with a GMI of 6.3%.   He denies any significant hypoglycemia.  He has done well with the discontinuation of Humalog last visit.) An ACE inhibitor/angiotensin II receptor blocker is being taken. He does not see a podiatrist.Eye exam is current.  Hyperlipidemia This is a chronic problem. The current episode started more than 1 year ago. The problem is uncontrolled. Recent lipid tests were reviewed and are variable. Exacerbating diseases include diabetes, hypothyroidism and obesity. He has no history of chronic renal disease. Factors aggravating his hyperlipidemia include fatty foods. Associated symptoms include myalgias. Current antihyperlipidemic  treatment includes statins. The current treatment provides moderate improvement of lipids. Compliance problems include adherence to diet and adherence to exercise.  Risk factors for coronary artery disease include family history, dyslipidemia, diabetes mellitus, hypertension, male sex, a sedentary lifestyle and obesity.  Thyroid Problem Presents for follow-up (He has longstanding hypothyroidism, currently on levothyroxine 150 mcg p.o. daily before breakfast.  He reports compliance with medication.) visit. Patient reports no cold intolerance, constipation, depressed mood, diarrhea, fatigue, heat intolerance, palpitations, weight gain or weight loss. The symptoms have been stable. His past medical history is significant for diabetes and hyperlipidemia.     Review of systems  Constitutional: + stable body weight,  current Body mass index is 35.74 kg/m. , + fatigue, no subjective hyperthermia, no subjective hypothermia Eyes: no blurry vision, no xerophthalmia ENT: no sore throat, no nodules palpated in throat, no dysphagia/odynophagia, no hoarseness Cardiovascular: no chest pain, no shortness of breath, no palpitations, no leg swelling Respiratory: no cough, no shortness of breath Gastrointestinal: no nausea/vomiting/diarrhea Musculoskeletal: diffuse muscle/joint aches Skin: no rashes, no hyperemia Neurological: no tremors, no numbness, no tingling, no dizziness Psychiatric: no depression, no anxiety   Objective:    BP 95/62 (BP Location: Right Arm, Patient Position: Sitting, Cuff Size: Large)   Pulse 94   Ht 5\' 9"  (1.753 m)   Wt 242 lb (109.8 kg)   BMI 35.74 kg/m   Wt Readings from Last 3 Encounters:  08/24/22 242 lb (109.8 kg)  07/24/22 241 lb 12.8 oz (109.7 kg)  04/24/22 252 lb 3.2 oz (114.4 kg)   BP Readings from Last 3 Encounters:  08/24/22 95/62  07/24/22 (!) 142/70  04/24/22 100/60     Physical Exam- Limited  Constitutional:  Body mass index is 35.74 kg/m. , not in  acute distress, normal state of mind Eyes:  EOMI, no exophthalmos Musculoskeletal: no gross deformities, strength intact in all four extremities, no gross restriction of joint movements Skin:  no rashes, no hyperemia Neurological: no tremor with outstretched hands   Diabetic Foot Exam - Simple   No data filed     Results for orders placed or performed in visit on 08/24/22  HgB A1c  Result Value Ref Range   Hemoglobin A1C 6.1 (A) 4.0 - 5.6 %   HbA1c POC (<> result, manual entry)     HbA1c, POC (prediabetic range)     HbA1c, POC (controlled diabetic range)  Lab Results  Component Value Date   WBC 11.9 (H) 01/31/2021   HGB 14.1 01/31/2021   HCT 43.5 01/31/2021   MCV 91.6 01/31/2021   PLT 227 01/31/2021    Lab Results  Component Value Date   HGBA1C 6.1 (A) 08/24/2022   HGBA1C 6.4 01/22/2022   HGBA1C 8.6 (A) 10/04/2021   MICROALBUR 80 01/22/2022   MICROALBUR 77.4 02/26/2020     Lipid Panel     Component Value Date/Time   CHOL 172 10/02/2021 1419   TRIG 295 (H) 10/02/2021 1419   TRIG 329 10/02/2007 0000   HDL 38 (L) 10/02/2021 1419   CHOLHDL 4.5 10/02/2021 1419   CHOLHDL 4.9 02/26/2020 1002   VLDL 19 09/09/2018 0949   LDLCALC 85 10/02/2021 1419   LDLCALC 136 (H) 02/26/2020 1002   LDLCALC 66 10/02/2007 0000     Assessment & Plan:   1) Type 2 diabetes mellitus with stage 3 chronic kidney disease, with long-term current use of insulin   He presents today with his CGM showing at target glycemic profile overall.  His POCT A1c today is 6.1%, improving from last visit of 6.4%.  Analysis of his CGM shows TIR 92%, TAR 5%, TBR 3% with a GMI of 6.3%.   He denies any significant hypoglycemia.  He has done well with the discontinuation of Humalog last visit.   Recent labs reviewed.  - His diabetes is complicated by CAD, CKD, CVA, Retinopathy. Patient remains at a high risk for more acute and chronic complications of diabetes which include CAD, CVA, CKD, retinopathy, and  neuropathy. These are all discussed in detail with the patient.  - Nutritional counseling repeated at each appointment due to patients tendency to fall back in to old habits.  - The patient admits there is a room for improvement in their diet and drink choices. -  Suggestion is made for the patient to avoid simple carbohydrates from their diet including Cakes, Sweet Desserts / Pastries, Ice Cream, Soda (diet and regular), Sweet Tea, Candies, Chips, Cookies, Sweet Pastries, Store Bought Juices, Alcohol in Excess of 1-2 drinks a day, Artificial Sweeteners, Coffee Creamer, and "Sugar-free" Products. This will help patient to have stable blood glucose profile and potentially avoid unintended weight gain.   - I encouraged the patient to switch to unprocessed or minimally processed complex starch and increased protein intake (animal or plant source), fruits, and vegetables.   - Patient is advised to stick to a routine mealtimes to eat 3 meals a day and avoid unnecessary snacks (to snack only to correct hypoglycemia).  -He is advised to decrease his Toujeo to 50 units SQ nightly and continue Ozempic 1 mg SQ weekly (may increase to the 2 mg dose next visit).  -He is encouraged to continue using his CGM to monitor glucose 4 times daily, before meals and before bed, and to call the clinic if he has readings less than 70 or greater than 200 for 3 tests in a row.  -Due to CKD patient is not a candidate for Metformin , SGLT2i.  -Target numbers for A1c, LDL, HDL, Triglycerides, Waist Circumference were discussed in detail.  2) Lipids/HPL:  His recent lipid panel from 10/02/21 shows controlled LDL of 73 and elevated triglycerides of 295.  He is advised to continue Crestor 5 mg p.o. daily at bedtime, Omega-3 fatty acid supplement, and Fenofibrate 160 mg p.o. daily.  Side effects and precautions discussed with him.  He sees his PCP for annual exam next week.  Will ask for copy of labs.  3) Hypothyroidism:    -There are no TFTs to review.  He is advised to continue his Levothyroxine 175 mcg po daily before breakfast. He sees his PCP for annual exam next week.  Will ask for copy of labs.   - We discussed about the correct intake of his thyroid hormone, on empty stomach at fasting, with water, separated by at least 30 minutes from breakfast and other medications,  and separated by more than 4 hours from calcium, iron, multivitamins, acid reflux medications (PPIs). -Patient is made aware of the fact that thyroid hormone replacement is needed for life, dose to be adjusted by periodic monitoring of thyroid function tests.  4) Hypertension/BP- His blood pressure is controlled to target.  He is advised to continue Losartan 50 mg po daily.  5) Vitamin D Deficiency His most recent vitamin D level was 22.2 on 10/02/21.  He takes an OTC supplement.  He could not remember to take his Ergocalciferol weekly, therefore he stopped it.  6) Hypercalcemia: His calcium level was 11.5 on 10/02/21, worsening from previous reading.  This is the second occurrence of high calcium levels.  He denies any personal or family history of thyroid cancer, adrenal problems, parathyroid problems, kidney stones, or fragility fractures.  His repeat calcium levels returned to normal of 10. Phosphorous and Magnesium was also WNL.  PTH was normal at 21, PTH-rp negative.  His urine studies were also unremarkable, ruling out [facility].  Given his labs have returned to normal, no need for further investigation at this time.  He does have vitamin D deficiency, now on replenishment therapy with Ergocalciferol (which may have contributed to his high calcium previously).  He also has a history of mild renal impairment, currently stable GFR of 60 and creatinine of 1.29.        I advised patient to maintain close follow up with his PCP for primary care needs.       I spent 36 minutes in the care of the patient today including review of labs from  CMP, Lipids, Thyroid Function, Hematology (current and previous including abstractions from other facilities); face-to-face time discussing  his blood glucose readings/logs, discussing hypoglycemia and hyperglycemia episodes and symptoms, medications doses, his options of short and long term treatment based on the latest standards of care / guidelines;  discussion about incorporating lifestyle medicine;  and documenting the encounter. Risk reduction counseling performed per USPSTF guidelines to reduce obesity and cardiovascular risk factors.     Please refer to Patient Instructions for Blood Glucose Monitoring and Insulin/Medications Dosing Guide"  in media tab for additional information. Please  also refer to " Patient Self Inventory" in the Media  tab for reviewed elements of pertinent patient history.  Dierdre Highman participated in the discussions, expressed understanding, and voiced agreement with the above plans.  All questions were answered to his satisfaction. he is encouraged to contact clinic should he have any questions or concerns prior to his return visit.    Follow up plan: Return in about 4 months (around 12/23/2022) for Diabetes F/U with A1c in office, No previsit labs, Bring meter and logs.   Ronny Bacon, [facility] [address] [place], [place] [address] Phone: [phone] Fax: [phone]   08/24/2022, 11:26 AM

## 2022-08-31 DIAGNOSIS — E539 Vitamin B deficiency, unspecified: Secondary | ICD-10-CM | POA: Diagnosis not present

## 2022-08-31 DIAGNOSIS — E1165 Type 2 diabetes mellitus with hyperglycemia: Secondary | ICD-10-CM | POA: Diagnosis not present

## 2022-08-31 DIAGNOSIS — J449 Chronic obstructive pulmonary disease, unspecified: Secondary | ICD-10-CM | POA: Diagnosis not present

## 2022-08-31 DIAGNOSIS — E569 Vitamin deficiency, unspecified: Secondary | ICD-10-CM | POA: Diagnosis not present

## 2022-08-31 DIAGNOSIS — E7849 Other hyperlipidemia: Secondary | ICD-10-CM | POA: Diagnosis not present

## 2022-08-31 DIAGNOSIS — E1122 Type 2 diabetes mellitus with diabetic chronic kidney disease: Secondary | ICD-10-CM | POA: Diagnosis not present

## 2022-08-31 DIAGNOSIS — G894 Chronic pain syndrome: Secondary | ICD-10-CM | POA: Diagnosis not present

## 2022-08-31 DIAGNOSIS — G720 Drug-induced myopathy: Secondary | ICD-10-CM | POA: Diagnosis not present

## 2022-08-31 DIAGNOSIS — E079 Disorder of thyroid, unspecified: Secondary | ICD-10-CM | POA: Diagnosis not present

## 2022-08-31 DIAGNOSIS — I25119 Atherosclerotic heart disease of native coronary artery with unspecified angina pectoris: Secondary | ICD-10-CM | POA: Diagnosis not present

## 2022-08-31 DIAGNOSIS — Z0001 Encounter for general adult medical examination with abnormal findings: Secondary | ICD-10-CM | POA: Diagnosis not present

## 2022-08-31 DIAGNOSIS — N1831 Chronic kidney disease, stage 3a: Secondary | ICD-10-CM | POA: Diagnosis not present

## 2022-09-19 ENCOUNTER — Other Ambulatory Visit: Payer: Self-pay | Admitting: Cardiology

## 2022-09-19 DIAGNOSIS — E7800 Pure hypercholesterolemia, unspecified: Secondary | ICD-10-CM

## 2022-09-28 DIAGNOSIS — E1165 Type 2 diabetes mellitus with hyperglycemia: Secondary | ICD-10-CM | POA: Diagnosis not present

## 2022-09-28 DIAGNOSIS — I25119 Atherosclerotic heart disease of native coronary artery with unspecified angina pectoris: Secondary | ICD-10-CM | POA: Diagnosis not present

## 2022-09-28 DIAGNOSIS — J449 Chronic obstructive pulmonary disease, unspecified: Secondary | ICD-10-CM | POA: Diagnosis not present

## 2022-09-28 DIAGNOSIS — N1831 Chronic kidney disease, stage 3a: Secondary | ICD-10-CM | POA: Diagnosis not present

## 2022-09-28 DIAGNOSIS — Z794 Long term (current) use of insulin: Secondary | ICD-10-CM | POA: Diagnosis not present

## 2022-09-28 DIAGNOSIS — E1122 Type 2 diabetes mellitus with diabetic chronic kidney disease: Secondary | ICD-10-CM | POA: Diagnosis not present

## 2022-09-28 DIAGNOSIS — M5412 Radiculopathy, cervical region: Secondary | ICD-10-CM | POA: Diagnosis not present

## 2022-09-28 DIAGNOSIS — I2510 Atherosclerotic heart disease of native coronary artery without angina pectoris: Secondary | ICD-10-CM | POA: Diagnosis not present

## 2022-09-28 DIAGNOSIS — G720 Drug-induced myopathy: Secondary | ICD-10-CM | POA: Diagnosis not present

## 2022-10-08 ENCOUNTER — Other Ambulatory Visit: Payer: Self-pay | Admitting: Cardiology

## 2022-10-08 DIAGNOSIS — I10 Essential (primary) hypertension: Secondary | ICD-10-CM

## 2022-10-15 ENCOUNTER — Other Ambulatory Visit: Payer: Self-pay | Admitting: Nurse Practitioner

## 2022-10-25 ENCOUNTER — Other Ambulatory Visit: Payer: Self-pay | Admitting: Nurse Practitioner

## 2022-10-25 DIAGNOSIS — E1122 Type 2 diabetes mellitus with diabetic chronic kidney disease: Secondary | ICD-10-CM | POA: Diagnosis not present

## 2022-10-25 DIAGNOSIS — E1165 Type 2 diabetes mellitus with hyperglycemia: Secondary | ICD-10-CM | POA: Diagnosis not present

## 2022-10-25 DIAGNOSIS — G720 Drug-induced myopathy: Secondary | ICD-10-CM | POA: Diagnosis not present

## 2022-10-25 DIAGNOSIS — I10 Essential (primary) hypertension: Secondary | ICD-10-CM | POA: Diagnosis not present

## 2022-10-25 DIAGNOSIS — Z794 Long term (current) use of insulin: Secondary | ICD-10-CM | POA: Diagnosis not present

## 2022-10-25 DIAGNOSIS — N1831 Chronic kidney disease, stage 3a: Secondary | ICD-10-CM | POA: Diagnosis not present

## 2022-10-25 DIAGNOSIS — I25119 Atherosclerotic heart disease of native coronary artery with unspecified angina pectoris: Secondary | ICD-10-CM | POA: Diagnosis not present

## 2022-10-25 DIAGNOSIS — M5412 Radiculopathy, cervical region: Secondary | ICD-10-CM | POA: Diagnosis not present

## 2022-10-25 DIAGNOSIS — J449 Chronic obstructive pulmonary disease, unspecified: Secondary | ICD-10-CM | POA: Diagnosis not present

## 2022-11-05 ENCOUNTER — Other Ambulatory Visit: Payer: Self-pay | Admitting: Nurse Practitioner

## 2022-11-20 DIAGNOSIS — Z794 Long term (current) use of insulin: Secondary | ICD-10-CM | POA: Diagnosis not present

## 2022-11-20 DIAGNOSIS — E1122 Type 2 diabetes mellitus with diabetic chronic kidney disease: Secondary | ICD-10-CM | POA: Diagnosis not present

## 2022-11-20 DIAGNOSIS — N183 Chronic kidney disease, stage 3 unspecified: Secondary | ICD-10-CM | POA: Diagnosis not present

## 2022-11-20 DIAGNOSIS — J449 Chronic obstructive pulmonary disease, unspecified: Secondary | ICD-10-CM | POA: Diagnosis not present

## 2022-11-20 DIAGNOSIS — I25119 Atherosclerotic heart disease of native coronary artery with unspecified angina pectoris: Secondary | ICD-10-CM | POA: Diagnosis not present

## 2022-11-20 DIAGNOSIS — N1831 Chronic kidney disease, stage 3a: Secondary | ICD-10-CM | POA: Diagnosis not present

## 2022-11-20 DIAGNOSIS — I10 Essential (primary) hypertension: Secondary | ICD-10-CM | POA: Diagnosis not present

## 2022-11-20 DIAGNOSIS — E1165 Type 2 diabetes mellitus with hyperglycemia: Secondary | ICD-10-CM | POA: Diagnosis not present

## 2022-11-20 DIAGNOSIS — G720 Drug-induced myopathy: Secondary | ICD-10-CM | POA: Diagnosis not present

## 2022-12-17 ENCOUNTER — Other Ambulatory Visit: Payer: Self-pay | Admitting: Nurse Practitioner

## 2022-12-18 ENCOUNTER — Other Ambulatory Visit: Payer: Self-pay | Admitting: "Endocrinology

## 2022-12-18 DIAGNOSIS — G720 Drug-induced myopathy: Secondary | ICD-10-CM | POA: Diagnosis not present

## 2022-12-18 DIAGNOSIS — J449 Chronic obstructive pulmonary disease, unspecified: Secondary | ICD-10-CM | POA: Diagnosis not present

## 2022-12-18 DIAGNOSIS — I25119 Atherosclerotic heart disease of native coronary artery with unspecified angina pectoris: Secondary | ICD-10-CM | POA: Diagnosis not present

## 2022-12-18 DIAGNOSIS — M5412 Radiculopathy, cervical region: Secondary | ICD-10-CM | POA: Diagnosis not present

## 2022-12-18 DIAGNOSIS — N183 Chronic kidney disease, stage 3 unspecified: Secondary | ICD-10-CM | POA: Diagnosis not present

## 2022-12-18 DIAGNOSIS — I10 Essential (primary) hypertension: Secondary | ICD-10-CM | POA: Diagnosis not present

## 2022-12-18 DIAGNOSIS — E1165 Type 2 diabetes mellitus with hyperglycemia: Secondary | ICD-10-CM | POA: Diagnosis not present

## 2022-12-18 DIAGNOSIS — Z794 Long term (current) use of insulin: Secondary | ICD-10-CM | POA: Diagnosis not present

## 2022-12-18 DIAGNOSIS — N1831 Chronic kidney disease, stage 3a: Secondary | ICD-10-CM | POA: Diagnosis not present

## 2022-12-24 ENCOUNTER — Ambulatory Visit (INDEPENDENT_AMBULATORY_CARE_PROVIDER_SITE_OTHER): Payer: 59 | Admitting: Nurse Practitioner

## 2022-12-24 ENCOUNTER — Encounter: Payer: Self-pay | Admitting: Nurse Practitioner

## 2022-12-24 VITALS — BP 111/69 | HR 91 | Ht 69.0 in | Wt 247.6 lb

## 2022-12-24 DIAGNOSIS — Z7985 Long-term (current) use of injectable non-insulin antidiabetic drugs: Secondary | ICD-10-CM

## 2022-12-24 DIAGNOSIS — I129 Hypertensive chronic kidney disease with stage 1 through stage 4 chronic kidney disease, or unspecified chronic kidney disease: Secondary | ICD-10-CM

## 2022-12-24 DIAGNOSIS — E039 Hypothyroidism, unspecified: Secondary | ICD-10-CM | POA: Diagnosis not present

## 2022-12-24 DIAGNOSIS — E1122 Type 2 diabetes mellitus with diabetic chronic kidney disease: Secondary | ICD-10-CM

## 2022-12-24 DIAGNOSIS — E559 Vitamin D deficiency, unspecified: Secondary | ICD-10-CM | POA: Diagnosis not present

## 2022-12-24 DIAGNOSIS — Z794 Long term (current) use of insulin: Secondary | ICD-10-CM | POA: Diagnosis not present

## 2022-12-24 DIAGNOSIS — E782 Mixed hyperlipidemia: Secondary | ICD-10-CM | POA: Diagnosis not present

## 2022-12-24 DIAGNOSIS — E1159 Type 2 diabetes mellitus with other circulatory complications: Secondary | ICD-10-CM

## 2022-12-24 DIAGNOSIS — N183 Chronic kidney disease, stage 3 unspecified: Secondary | ICD-10-CM | POA: Diagnosis not present

## 2022-12-24 DIAGNOSIS — I10 Essential (primary) hypertension: Secondary | ICD-10-CM

## 2022-12-24 LAB — POCT GLYCOSYLATED HEMOGLOBIN (HGB A1C): Hemoglobin A1C: 6 % — AB (ref 4.0–5.6)

## 2022-12-24 MED ORDER — TOUJEO MAX SOLOSTAR 300 UNIT/ML ~~LOC~~ SOPN: 40.0000 [IU] | PEN_INJECTOR | Freq: Every day | SUBCUTANEOUS | 3 refills | Status: DC

## 2022-12-24 MED ORDER — LEVOTHYROXINE SODIUM 175 MCG PO TABS: 175.0000 ug | ORAL_TABLET | Freq: Every day | ORAL | 1 refills | Status: DC

## 2022-12-24 MED ORDER — OZEMPIC (1 MG/DOSE) 4 MG/3ML ~~LOC~~ SOPN: 1.0000 mg | PEN_INJECTOR | SUBCUTANEOUS | 1 refills | Status: DC

## 2022-12-24 NOTE — Progress Notes (Signed)
12/24/2022   Endocrinology follow-up note   Subjective:    Patient ID: [name], male    DOB: 1956/06/23,    Past Medical History:  Diagnosis Date   ASCVD (arteriosclerotic cardiovascular disease)    -MI in 01/2001 prompted CABG; EF-30% at cath; 50% on echo in 7/02 and normal in 2005   Asthma    Cerebrovascular disease     L CEA 06/2004 following left renal embolism; 10/2008 plaque w/o focal stenosis   Chronic back pain    Chronic leg pain    Chronic obstructive pulmonary disease (HCC)    Degenerative joint disease     chronic LBP-s/p L3-4 fusion   Diabetes mellitus    -no insulin   Hyperlipidemia    Hypertension    Hypothyroidism    Obesity    Restless leg syndrome    Possible   Tobacco abuse, in remission    50 pack years; discontinued in 2002   Past Surgical History:  Procedure Laterality Date   CAROTID ENDARTERECTOMY  08/14/2003   Left   CATARACT EXTRACTION     bilateral   CHOLECYSTECTOMY N/A 02/06/2021   Procedure: LAPAROSCOPIC CHOLECYSTECTOMY;  Surgeon: Franky Macho, MD;  Location: [facility];  Service: General;  Laterality: N/A;   CORONARY ARTERY BYPASS GRAFT  08/13/2000   GALLBLADDER SURGERY  02/07/2021   LUMBAR SPINE SURGERY     L3-4 fusion   Social History   Socioeconomic History   Marital status: Divorced    Spouse name: Not on file   Number of children: 1   Years of education: Not on file   Highest education level: Not on file  Occupational History   Occupation: veteran    Comment: disabledf  Tobacco Use   Smoking status: Former    Packs/day: 1.00    Years: 50.00    Additional pack years: 0.00    Total pack years: 50.00    Types: Cigarettes    Quit date: 01/15/2001    Years since quitting: 21.9   Smokeless tobacco: Never  Vaping Use   Vaping Use: Never used  Substance and Sexual Activity   Alcohol use: No   Drug use: No   Sexual activity: Not Currently  Other Topics Concern   Not on file  Social History Narrative   Not on  file   Social Determinants of Health   Financial Resource Strain: Low Risk  (04/10/2022)   Overall Financial Resource Strain (CARDIA)    Difficulty of Paying Living Expenses: Not hard at all  Food Insecurity: Not on file  Transportation Needs: No Transportation Needs (04/10/2022)   PRAPARE - Transportation    Lack of Transportation (Medical): No    Lack of Transportation (Non-Medical): No  Physical Activity: Not on file  Stress: Not on file  Social Connections: Not on file   Outpatient Encounter Medications as of 12/24/2022  Medication Sig   albuterol (VENTOLIN HFA) 108 (90 Base) MCG/ACT inhaler Inhale 1-2 puffs into the lungs every 6 (six) hours as needed for shortness of breath or wheezing.   albuterol-ipratropium (COMBIVENT) 18-103 MCG/ACT inhaler Inhale 2 puffs into the lungs every 6 (six) hours as needed for wheezing or shortness of breath.    Ascorbic Acid (VITAMIN C) 1000 MG tablet Take 1,000 mg by mouth daily.   aspirin 81 MG tablet Take 81 mg by mouth daily.     B Complex-Biotin-FA (VITAMIN B50 COMPLEX PO) Take 1 capsule by mouth daily.   Cholecalciferol (VITAMIN D  PO) Take 1 capsule by mouth daily.   Continuous Blood Gluc Receiver (FREESTYLE LIBRE 2 READER) DEVI Use to monitor glucose 4 times daily   Continuous Blood Gluc Sensor (FREESTYLE LIBRE 2 SENSOR) MISC USE AS DIRECTED TO MONITOR BLOOD SUGAR (4) TIMES DAILY.   diazepam (VALIUM) 10 MG tablet Take 5 mg by mouth at bedtime as needed for anxiety.   docusate sodium (COLACE) 100 MG capsule Take 200 mg by mouth daily.   fenofibrate 160 MG tablet Take 160 mg by mouth daily.   fish oil-omega-3 fatty acids 1000 MG capsule Take 2 g by mouth daily.   gabapentin (NEURONTIN) 300 MG capsule Take 300 mg by mouth at bedtime.   glucose blood (ACCU-CHEK AVIVA PLUS) test strip Use as directed to check blood glucose four times daily   HYDROmorphone (DILAUDID) 4 MG tablet Take 4 mg by mouth 3 (three) times daily.   Insulin Pen Needle (SURE  COMFORT PEN NEEDLES) 31G X 8 MM MISC USE AS DIRECTED WITH LANTUS AND NOVOLOG UP TO FOUR TIMES DAILY.   losartan (COZAAR) 50 MG tablet TAKE ONE TABLET BY MOUTH ONCE DAILY.   Multiple Vitamins-Minerals (MULTIVITAMIN WITH MINERALS) tablet Take 1 tablet by mouth daily.     nitroGLYCERIN (NITROSTAT) 0.4 MG SL tablet Place 0.4 mg under the tongue every 5 (five) minutes as needed for chest pain.   OVER THE COUNTER MEDICATION Vitamin B 50 - patient reports that he takes daily.   rosuvastatin (CRESTOR) 40 MG tablet TAKE 1 TABLET BY MOUTH DAILY. KEEP OV.   [DISCONTINUED] insulin glargine, 2 Unit Dial, (TOUJEO MAX SOLOSTAR) 300 UNIT/ML Solostar Pen Inject 50 Units into the skin at bedtime.   [DISCONTINUED] levothyroxine (SYNTHROID) 175 MCG tablet Take 1 tablet (175 mcg total) by mouth daily before breakfast.   [DISCONTINUED] Semaglutide, 1 MG/DOSE, (OZEMPIC, 1 MG/DOSE,) 4 MG/3ML SOPN INJECT 1 MG INTO THE SKIN WEEKLY.   insulin glargine, 2 Unit Dial, (TOUJEO MAX SOLOSTAR) 300 UNIT/ML Solostar Pen Inject 40 Units into the skin at bedtime.   levothyroxine (SYNTHROID) 175 MCG tablet Take 1 tablet (175 mcg total) by mouth daily before breakfast.   Semaglutide, 1 MG/DOSE, (OZEMPIC, 1 MG/DOSE,) 4 MG/3ML SOPN Inject 1 mg into the skin once a week.   No facility-administered encounter medications on file as of 12/24/2022.   ALLERGIES: Allergies  Allergen Reactions   Alprazolam Nausea And Vomiting and Other (See Comments)    Altered mental status   Atorvastatin Nausea And Vomiting   Gemfibrozil     unknown   Invokana [Canagliflozin] Diarrhea and Nausea And Vomiting   Definity [Perflutren Lipid Microsphere] Other (See Comments)    Headache   VACCINATION STATUS: Immunization History  Administered Date(s) Administered   Influenza-Unspecified 05/13/2014    Diabetes He presents for his follow-up diabetic visit. He has type 2 diabetes mellitus. Onset time: He was diagnosed at approximate age of 50 years. His  disease course has been stable. There are no hypoglycemic associated symptoms. Pertinent negatives for hypoglycemia include no confusion, headaches or pallor. Associated symptoms include foot paresthesias. Pertinent negatives for diabetes include no fatigue, no polydipsia, no polyphagia, no polyuria and no weight loss. There are no hypoglycemic complications. Symptoms are stable. Diabetic complications include a CVA, heart disease, nephropathy and peripheral neuropathy. Risk factors for coronary artery disease include diabetes mellitus, dyslipidemia, hypertension, male sex, tobacco exposure, sedentary lifestyle and obesity. Current diabetic treatment includes insulin injections (and Ozempic). He is compliant with treatment most of the time. His weight  is fluctuating minimally. He is following a generally healthy diet. When asked about meal planning, he reported none. He has had a previous visit with a dietitian. He participates in exercise intermittently. His home blood glucose trend is fluctuating minimally. His overall blood glucose range is 130-140 mg/dl. (He presents today with his CGM showing at target glycemic profile overall.  His POCT A1c today is 6%, improving from last visit of 6.1%.  Analysis of his CGM shows TIR 87%, TAR13%, TBR 0% with a GMI of 6.6%.   He denies any significant hypoglycemia.  ) An ACE inhibitor/angiotensin II receptor blocker is being taken. He does not see a podiatrist.Eye exam is current.  Hyperlipidemia This is a chronic problem. The current episode started more than 1 year ago. The problem is uncontrolled. Recent lipid tests were reviewed and are variable. Exacerbating diseases include diabetes, hypothyroidism and obesity. He has no history of chronic renal disease. Factors aggravating his hyperlipidemia include fatty foods. Associated symptoms include myalgias. Current antihyperlipidemic treatment includes statins. The current treatment provides moderate improvement of lipids.  Compliance problems include adherence to diet and adherence to exercise.  Risk factors for coronary artery disease include family history, dyslipidemia, diabetes mellitus, hypertension, male sex, a sedentary lifestyle and obesity.  Thyroid Problem Presents for follow-up (He has longstanding hypothyroidism, currently on levothyroxine 150 mcg p.o. daily before breakfast.  He reports compliance with medication.) visit. Patient reports no cold intolerance, constipation, depressed mood, diarrhea, fatigue, heat intolerance, palpitations, weight gain or weight loss. The symptoms have been stable. His past medical history is significant for diabetes and hyperlipidemia.     Review of systems  Constitutional: + stable body weight,  current Body mass index is 36.56 kg/m. , + fatigue, no subjective hyperthermia, no subjective hypothermia Eyes: no blurry vision, no xerophthalmia ENT: no sore throat, no nodules palpated in throat, no dysphagia/odynophagia, no hoarseness Cardiovascular: no chest pain, no shortness of breath, no palpitations, no leg swelling Respiratory: no cough, no shortness of breath Gastrointestinal: no nausea/vomiting/diarrhea Musculoskeletal: diffuse muscle/joint aches Skin: no rashes, no hyperemia Neurological: no tremors, no numbness, no tingling, no dizziness Psychiatric: no depression, no anxiety   Objective:    BP 111/69 (BP Location: Right Arm, Patient Position: Sitting, Cuff Size: Large)   Pulse 91   Ht 5\' 9"  (1.753 m)   Wt 247 lb 9.6 oz (112.3 kg)   BMI 36.56 kg/m   Wt Readings from Last 3 Encounters:  12/24/22 247 lb 9.6 oz (112.3 kg)  08/24/22 242 lb (109.8 kg)  07/24/22 241 lb 12.8 oz (109.7 kg)   BP Readings from Last 3 Encounters:  12/24/22 111/69  08/24/22 95/62  07/24/22 (!) 142/70     Physical Exam- Limited  Constitutional:  Body mass index is 36.56 kg/m. , not in acute distress, normal state of mind Eyes:  EOMI, no exophthalmos Musculoskeletal: no  gross deformities, strength intact in all four extremities, no gross restriction of joint movements Skin:  no rashes, no hyperemia Neurological: no tremor with outstretched hands   Diabetic Foot Exam - Simple   No data filed     Results for orders placed or performed in visit on 12/24/22  HgB A1c  Result Value Ref Range   Hemoglobin A1C 6.0 (A) 4.0 - 5.6 %   HbA1c POC (<> result, manual entry)     HbA1c, POC (prediabetic range)     HbA1c, POC (controlled diabetic range)     Lab Results  Component Value Date  WBC 11.9 (H) 01/31/2021   HGB 14.1 01/31/2021   HCT 43.5 01/31/2021   MCV 91.6 01/31/2021   PLT 227 01/31/2021    Lab Results  Component Value Date   HGBA1C 6.0 (A) 12/24/2022   HGBA1C 6.1 (A) 08/24/2022   HGBA1C 6.4 01/22/2022   MICROALBUR 80 01/22/2022   MICROALBUR 77.4 02/26/2020     Lipid Panel     Component Value Date/Time   CHOL 172 10/02/2021 1419   TRIG 295 (H) 10/02/2021 1419   TRIG 329 10/02/2007 0000   HDL 38 (L) 10/02/2021 1419   CHOLHDL 4.5 10/02/2021 1419   CHOLHDL 4.9 02/26/2020 1002   VLDL 19 09/09/2018 0949   LDLCALC 85 10/02/2021 1419   LDLCALC 136 (H) 02/26/2020 1002   LDLCALC 66 10/02/2007 0000     Assessment & Plan:   1) Type 2 diabetes mellitus with stage 3 chronic kidney disease, with long-term current use of insulin   He presents today with his CGM showing at target glycemic profile overall.  His POCT A1c today is 6%, improving from last visit of 6.1%.  Analysis of his CGM shows TIR 87%, TAR13%, TBR 0% with a GMI of 6.6%.   He denies any significant hypoglycemia.   Recent labs reviewed.  - His diabetes is complicated by CAD, CKD, CVA, Retinopathy. Patient remains at a high risk for more acute and chronic complications of diabetes which include CAD, CVA, CKD, retinopathy, and neuropathy. These are all discussed in detail with the patient.  - Nutritional counseling repeated at each appointment due to patients tendency to fall  back in to old habits.  - The patient admits there is a room for improvement in their diet and drink choices. -  Suggestion is made for the patient to avoid simple carbohydrates from their diet including Cakes, Sweet Desserts / Pastries, Ice Cream, Soda (diet and regular), Sweet Tea, Candies, Chips, Cookies, Sweet Pastries, Store Bought Juices, Alcohol in Excess of 1-2 drinks a day, Artificial Sweeteners, Coffee Creamer, and "Sugar-free" Products. This will help patient to have stable blood glucose profile and potentially avoid unintended weight gain.   - I encouraged the patient to switch to unprocessed or minimally processed complex starch and increased protein intake (animal or plant source), fruits, and vegetables.   - Patient is advised to stick to a routine mealtimes to eat 3 meals a day and avoid unnecessary snacks (to snack only to correct hypoglycemia).  -He is advised to decrease his Toujeo to 40 units SQ nightly and continue Ozempic 1 mg SQ weekly (may increase to the 2 mg dose next visit-waited due to shortage in higher doses).  -He is encouraged to continue using his CGM to monitor glucose 4 times daily, before meals and before bed, and to call the clinic if he has readings less than 70 or greater than 200 for 3 tests in a row.  -Due to CKD patient is not a candidate for Metformin , SGLT2i.  -Target numbers for A1c, LDL, HDL, Triglycerides, Waist Circumference were discussed in detail.  2) Lipids/HPL:  His recent lipid panel from 10/02/21 shows controlled LDL of 73 and elevated triglycerides of 295.  He is advised to continue Crestor 5 mg p.o. daily at bedtime, Omega-3 fatty acid supplement, and Fenofibrate 160 mg p.o. daily.  Side effects and precautions discussed with him.    3) Hypothyroidism:   -There are no TFTs to review.  He is advised to continue his Levothyroxine 175 mcg po daily before  breakfast. Will recheck TFTs prior to next visit.   - We discussed about the correct  intake of his thyroid hormone, on empty stomach at fasting, with water, separated by at least 30 minutes from breakfast and other medications,  and separated by more than 4 hours from calcium, iron, multivitamins, acid reflux medications (PPIs). -Patient is made aware of the fact that thyroid hormone replacement is needed for life, dose to be adjusted by periodic monitoring of thyroid function tests.  4) Hypertension/BP- His blood pressure is controlled to target.  He is advised to continue Losartan 50 mg po daily.  5) Vitamin D Deficiency His most recent vitamin D level was 22.2 on 10/02/21.  He takes an OTC supplement.  He could not remember to take his Ergocalciferol weekly, therefore he stopped it.  6) Hypercalcemia- resolved His calcium level was 11.5 on 10/02/21, worsening from previous reading.  This is the second occurrence of high calcium levels.  He denies any personal or family history of thyroid cancer, adrenal problems, parathyroid problems, kidney stones, or fragility fractures.  His repeat calcium levels returned to normal of 10. Phosphorous and Magnesium was also WNL.  PTH was normal at 21, PTH-rp negative.  His urine studies were also unremarkable, ruling out [facility].  Given his labs have returned to normal, no need for further investigation at this time.       I advised patient to maintain close follow up with his PCP for primary care needs.     I spent  42  minutes in the care of the patient today including review of labs from CMP, Lipids, Thyroid Function, Hematology (current and previous including abstractions from other facilities); face-to-face time discussing  his blood glucose readings/logs, discussing hypoglycemia and hyperglycemia episodes and symptoms, medications doses, his options of short and long term treatment based on the latest standards of care / guidelines;  discussion about incorporating lifestyle medicine;  and documenting the encounter. Risk reduction  counseling performed per USPSTF guidelines to reduce obesity and cardiovascular risk factors.     Please refer to Patient Instructions for Blood Glucose Monitoring and Insulin/Medications Dosing Guide"  in media tab for additional information. Please  also refer to " Patient Self Inventory" in the Media  tab for reviewed elements of pertinent patient history.  [name] participated in the discussions, expressed understanding, and voiced agreement with the above plans.  All questions were answered to his satisfaction. he is encouraged to contact clinic should he have any questions or concerns prior to his return visit.    Follow up plan: Return in about 4 months (around 04/26/2023) for Diabetes F/U with A1c in office, Thyroid follow up, Previsit labs, Bring meter and logs.   Ronny Bacon, [facility] [address] [place], [place] [address] Phone: [phone] Fax: [phone]   12/24/2022, 11:49 AM

## 2022-12-28 ENCOUNTER — Other Ambulatory Visit: Payer: Self-pay | Admitting: Cardiology

## 2022-12-28 DIAGNOSIS — E7800 Pure hypercholesterolemia, unspecified: Secondary | ICD-10-CM

## 2023-01-17 DIAGNOSIS — J449 Chronic obstructive pulmonary disease, unspecified: Secondary | ICD-10-CM | POA: Diagnosis not present

## 2023-01-17 DIAGNOSIS — E1122 Type 2 diabetes mellitus with diabetic chronic kidney disease: Secondary | ICD-10-CM | POA: Diagnosis not present

## 2023-01-17 DIAGNOSIS — N183 Chronic kidney disease, stage 3 unspecified: Secondary | ICD-10-CM | POA: Diagnosis not present

## 2023-01-17 DIAGNOSIS — I10 Essential (primary) hypertension: Secondary | ICD-10-CM | POA: Diagnosis not present

## 2023-01-17 DIAGNOSIS — E1165 Type 2 diabetes mellitus with hyperglycemia: Secondary | ICD-10-CM | POA: Diagnosis not present

## 2023-01-17 DIAGNOSIS — I25119 Atherosclerotic heart disease of native coronary artery with unspecified angina pectoris: Secondary | ICD-10-CM | POA: Diagnosis not present

## 2023-01-17 DIAGNOSIS — M5412 Radiculopathy, cervical region: Secondary | ICD-10-CM | POA: Diagnosis not present

## 2023-01-17 DIAGNOSIS — N1831 Chronic kidney disease, stage 3a: Secondary | ICD-10-CM | POA: Diagnosis not present

## 2023-01-17 DIAGNOSIS — G720 Drug-induced myopathy: Secondary | ICD-10-CM | POA: Diagnosis not present

## 2023-01-22 DIAGNOSIS — I10 Essential (primary) hypertension: Secondary | ICD-10-CM | POA: Diagnosis not present

## 2023-02-19 DIAGNOSIS — Z794 Long term (current) use of insulin: Secondary | ICD-10-CM | POA: Diagnosis not present

## 2023-02-19 DIAGNOSIS — E1165 Type 2 diabetes mellitus with hyperglycemia: Secondary | ICD-10-CM | POA: Diagnosis not present

## 2023-02-19 DIAGNOSIS — I10 Essential (primary) hypertension: Secondary | ICD-10-CM | POA: Diagnosis not present

## 2023-02-19 DIAGNOSIS — M5412 Radiculopathy, cervical region: Secondary | ICD-10-CM | POA: Diagnosis not present

## 2023-02-19 DIAGNOSIS — G894 Chronic pain syndrome: Secondary | ICD-10-CM | POA: Diagnosis not present

## 2023-02-19 DIAGNOSIS — G720 Drug-induced myopathy: Secondary | ICD-10-CM | POA: Diagnosis not present

## 2023-02-19 DIAGNOSIS — E1122 Type 2 diabetes mellitus with diabetic chronic kidney disease: Secondary | ICD-10-CM | POA: Diagnosis not present

## 2023-02-19 DIAGNOSIS — J449 Chronic obstructive pulmonary disease, unspecified: Secondary | ICD-10-CM | POA: Diagnosis not present

## 2023-02-19 DIAGNOSIS — N1831 Chronic kidney disease, stage 3a: Secondary | ICD-10-CM | POA: Diagnosis not present

## 2023-03-14 DIAGNOSIS — N1831 Chronic kidney disease, stage 3a: Secondary | ICD-10-CM | POA: Diagnosis not present

## 2023-03-14 DIAGNOSIS — M5412 Radiculopathy, cervical region: Secondary | ICD-10-CM | POA: Diagnosis not present

## 2023-03-14 DIAGNOSIS — E1159 Type 2 diabetes mellitus with other circulatory complications: Secondary | ICD-10-CM | POA: Diagnosis not present

## 2023-03-14 DIAGNOSIS — E1165 Type 2 diabetes mellitus with hyperglycemia: Secondary | ICD-10-CM | POA: Diagnosis not present

## 2023-03-14 DIAGNOSIS — I10 Essential (primary) hypertension: Secondary | ICD-10-CM | POA: Diagnosis not present

## 2023-03-14 DIAGNOSIS — J449 Chronic obstructive pulmonary disease, unspecified: Secondary | ICD-10-CM | POA: Diagnosis not present

## 2023-03-14 DIAGNOSIS — E1122 Type 2 diabetes mellitus with diabetic chronic kidney disease: Secondary | ICD-10-CM | POA: Diagnosis not present

## 2023-03-14 DIAGNOSIS — I25119 Atherosclerotic heart disease of native coronary artery with unspecified angina pectoris: Secondary | ICD-10-CM | POA: Diagnosis not present

## 2023-03-18 ENCOUNTER — Telehealth: Payer: Self-pay | Admitting: Internal Medicine

## 2023-03-18 NOTE — Telephone Encounter (Signed)
Patient wants to switch providers he said that getting to [facility] has became a little bit of a hassall. He would like to be seen in [facility]. He wants to see Wyline Mood if possible.

## 2023-04-01 ENCOUNTER — Telehealth: Payer: Self-pay | Admitting: Nurse Practitioner

## 2023-04-01 DIAGNOSIS — E039 Hypothyroidism, unspecified: Secondary | ICD-10-CM | POA: Diagnosis not present

## 2023-04-01 NOTE — Telephone Encounter (Signed)
Pt came by clinic stating that his [place] 2 receiver was not working. Checked the meter and it seemed to be working fine. However, his bg readings were showing lows. Per whitney pt is to lower his toujeo to 20 units instead of 40. Pt agrees and will call if he has any more lows

## 2023-04-29 ENCOUNTER — Ambulatory Visit: Payer: 59 | Admitting: Nurse Practitioner

## 2023-04-29 DIAGNOSIS — I10 Essential (primary) hypertension: Secondary | ICD-10-CM

## 2023-04-29 DIAGNOSIS — Z7985 Long-term (current) use of injectable non-insulin antidiabetic drugs: Secondary | ICD-10-CM

## 2023-04-29 DIAGNOSIS — Z794 Long term (current) use of insulin: Secondary | ICD-10-CM

## 2023-04-29 DIAGNOSIS — E559 Vitamin D deficiency, unspecified: Secondary | ICD-10-CM

## 2023-04-29 DIAGNOSIS — E1159 Type 2 diabetes mellitus with other circulatory complications: Secondary | ICD-10-CM

## 2023-04-29 DIAGNOSIS — E782 Mixed hyperlipidemia: Secondary | ICD-10-CM

## 2023-04-29 DIAGNOSIS — E039 Hypothyroidism, unspecified: Secondary | ICD-10-CM

## 2023-04-30 ENCOUNTER — Ambulatory Visit (INDEPENDENT_AMBULATORY_CARE_PROVIDER_SITE_OTHER): Payer: 59 | Admitting: Nurse Practitioner

## 2023-04-30 ENCOUNTER — Encounter: Payer: Self-pay | Admitting: Nurse Practitioner

## 2023-04-30 VITALS — BP 91/60 | HR 84 | Ht 69.0 in | Wt 249.0 lb

## 2023-04-30 DIAGNOSIS — Z794 Long term (current) use of insulin: Secondary | ICD-10-CM

## 2023-04-30 DIAGNOSIS — E1122 Type 2 diabetes mellitus with diabetic chronic kidney disease: Secondary | ICD-10-CM

## 2023-04-30 DIAGNOSIS — E039 Hypothyroidism, unspecified: Secondary | ICD-10-CM | POA: Diagnosis not present

## 2023-04-30 DIAGNOSIS — N189 Chronic kidney disease, unspecified: Secondary | ICD-10-CM | POA: Diagnosis not present

## 2023-04-30 DIAGNOSIS — E785 Hyperlipidemia, unspecified: Secondary | ICD-10-CM

## 2023-04-30 DIAGNOSIS — I152 Hypertension secondary to endocrine disorders: Secondary | ICD-10-CM

## 2023-04-30 DIAGNOSIS — E1159 Type 2 diabetes mellitus with other circulatory complications: Secondary | ICD-10-CM

## 2023-04-30 DIAGNOSIS — Z7985 Long-term (current) use of injectable non-insulin antidiabetic drugs: Secondary | ICD-10-CM

## 2023-04-30 LAB — POCT GLYCOSYLATED HEMOGLOBIN (HGB A1C): Hemoglobin A1C: 6.4 % — AB (ref 4.0–5.6)

## 2023-04-30 MED ORDER — LEVOTHYROXINE SODIUM 175 MCG PO TABS: 175.0000 ug | ORAL_TABLET | Freq: Every day | ORAL | 1 refills | Status: DC

## 2023-04-30 MED ORDER — FREESTYLE LIBRE 2 SENSOR MISC: 3 refills | Status: DC

## 2023-04-30 MED ORDER — OZEMPIC (1 MG/DOSE) 4 MG/3ML ~~LOC~~ SOPN: 1.0000 mg | PEN_INJECTOR | SUBCUTANEOUS | 1 refills | Status: DC

## 2023-04-30 MED ORDER — TOUJEO MAX SOLOSTAR 300 UNIT/ML ~~LOC~~ SOPN: 30.0000 [IU] | PEN_INJECTOR | Freq: Every day | SUBCUTANEOUS | 3 refills | Status: DC

## 2023-04-30 NOTE — Progress Notes (Signed)
04/30/2023   Endocrinology follow-up note   Subjective:    Patient ID: [name], male    DOB: 11/06/1955,    Past Medical History:  Diagnosis Date   ASCVD (arteriosclerotic cardiovascular disease)    -MI in 01/2001 prompted CABG; EF-30% at cath; 50% on echo in 7/02 and normal in 2005   Asthma    Cerebrovascular disease     L CEA 06/2004 following left renal embolism; 10/2008 plaque w/o focal stenosis   Chronic back pain    Chronic leg pain    Chronic obstructive pulmonary disease (HCC)    Degenerative joint disease     chronic LBP-s/p L3-4 fusion   Diabetes mellitus    -no insulin   Hyperlipidemia    Hypertension    Hypothyroidism    Obesity    Restless leg syndrome    Possible   Tobacco abuse, in remission    50 pack years; discontinued in 2002   Past Surgical History:  Procedure Laterality Date   CAROTID ENDARTERECTOMY  08/14/2003   Left   CATARACT EXTRACTION     bilateral   CHOLECYSTECTOMY N/A 02/06/2021   Procedure: LAPAROSCOPIC CHOLECYSTECTOMY;  Surgeon: Franky Macho, MD;  Location: [facility];  Service: General;  Laterality: N/A;   CORONARY ARTERY BYPASS GRAFT  08/13/2000   GALLBLADDER SURGERY  02/07/2021   LUMBAR SPINE SURGERY     L3-4 fusion   Social History   Socioeconomic History   Marital status: Divorced    Spouse name: Not on file   Number of children: 1   Years of education: Not on file   Highest education level: Not on file  Occupational History   Occupation: veteran    Comment: disabledf  Tobacco Use   Smoking status: Former    Current packs/day: 0.00    Average packs/day: 1 pack/day for 50.0 years (50.0 ttl pk-yrs)    Types: Cigarettes    Start date: 01/16/1951    Quit date: 01/15/2001    Years since quitting: 22.3   Smokeless tobacco: Never  Vaping Use   Vaping status: Never Used  Substance and Sexual Activity   Alcohol use: No   Drug use: No   Sexual activity: Not Currently  Other Topics Concern   Not on file  Social  History Narrative   Not on file   Social Determinants of Health   Financial Resource Strain: Low Risk  (04/10/2022)   Overall Financial Resource Strain (CARDIA)    Difficulty of Paying Living Expenses: Not hard at all  Food Insecurity: Not on file  Transportation Needs: No Transportation Needs (04/10/2022)   PRAPARE - Transportation    Lack of Transportation (Medical): No    Lack of Transportation (Non-Medical): No  Physical Activity: Not on file  Stress: Not on file  Social Connections: Unknown (12/26/2021)   Received from [facility]   Social Network    Social Network: Not on file   Outpatient Encounter Medications as of 04/30/2023  Medication Sig   albuterol (VENTOLIN HFA) 108 (90 Base) MCG/ACT inhaler Inhale 1-2 puffs into the lungs every 6 (six) hours as needed for shortness of breath or wheezing.   albuterol-ipratropium (COMBIVENT) 18-103 MCG/ACT inhaler Inhale 2 puffs into the lungs every 6 (six) hours as needed for wheezing or shortness of breath.    Ascorbic Acid (VITAMIN C) 1000 MG tablet Take 1,000 mg by mouth daily.   aspirin 81 MG tablet Take 81 mg by mouth daily.  B Complex-Biotin-FA (VITAMIN B50 COMPLEX PO) Take 1 capsule by mouth daily.   Cholecalciferol (VITAMIN D PO) Take 1 capsule by mouth daily.   Continuous Blood Gluc Receiver (FREESTYLE LIBRE 2 READER) DEVI Use to monitor glucose 4 times daily   diazepam (VALIUM) 10 MG tablet Take 5 mg by mouth at bedtime as needed for anxiety.   docusate sodium (COLACE) 100 MG capsule Take 200 mg by mouth daily.   fenofibrate 160 MG tablet Take 160 mg by mouth daily.   fish oil-omega-3 fatty acids 1000 MG capsule Take 2 g by mouth daily.   gabapentin (NEURONTIN) 300 MG capsule Take 300 mg by mouth at bedtime.   glucose blood (ACCU-CHEK AVIVA PLUS) test strip Use as directed to check blood glucose four times daily   Insulin Pen Needle (SURE COMFORT PEN NEEDLES) 31G X 8 MM MISC USE AS DIRECTED WITH LANTUS AND  NOVOLOG UP TO FOUR TIMES DAILY.   losartan (COZAAR) 50 MG tablet TAKE ONE TABLET BY MOUTH ONCE DAILY.   Multiple Vitamins-Minerals (MULTIVITAMIN WITH MINERALS) tablet Take 1 tablet by mouth daily.     nitroGLYCERIN (NITROSTAT) 0.4 MG SL tablet Place 0.4 mg under the tongue every 5 (five) minutes as needed for chest pain.   OVER THE COUNTER MEDICATION Vitamin B 50 - patient reports that he takes daily.   rosuvastatin (CRESTOR) 40 MG tablet TAKE 1 TABLET BY MOUTH DAILY. KEEP OV.   [DISCONTINUED] Continuous Blood Gluc Sensor (FREESTYLE LIBRE 2 SENSOR) MISC USE AS DIRECTED TO MONITOR BLOOD SUGAR (4) TIMES DAILY.   [DISCONTINUED] insulin glargine, 2 Unit Dial, (TOUJEO MAX SOLOSTAR) 300 UNIT/ML Solostar Pen Inject 40 Units into the skin at bedtime.   [DISCONTINUED] levothyroxine (SYNTHROID) 175 MCG tablet Take 1 tablet (175 mcg total) by mouth daily before breakfast.   [DISCONTINUED] Semaglutide, 1 MG/DOSE, (OZEMPIC, 1 MG/DOSE,) 4 MG/3ML SOPN Inject 1 mg into the skin once a week.   Continuous Glucose Sensor (FREESTYLE LIBRE 2 SENSOR) MISC USE AS DIRECTED TO MONITOR BLOOD SUGAR (4) TIMES DAILY.   HYDROmorphone (DILAUDID) 4 MG tablet Take 4 mg by mouth 3 (three) times daily. (Patient not taking: Reported on 04/30/2023)   insulin glargine, 2 Unit Dial, (TOUJEO MAX SOLOSTAR) 300 UNIT/ML Solostar Pen Inject 30 Units into the skin at bedtime.   levothyroxine (SYNTHROID) 175 MCG tablet Take 1 tablet (175 mcg total) by mouth daily before breakfast.   Semaglutide, 1 MG/DOSE, (OZEMPIC, 1 MG/DOSE,) 4 MG/3ML SOPN Inject 1 mg into the skin once a week.   No facility-administered encounter medications on file as of 04/30/2023.   ALLERGIES: Allergies  Allergen Reactions   Alprazolam Nausea And Vomiting and Other (See Comments)    Altered mental status   Atorvastatin Nausea And Vomiting   Gemfibrozil     unknown   Invokana [Canagliflozin] Diarrhea and Nausea And Vomiting   Definity [Perflutren Lipid Microsphere]  Other (See Comments)    Headache   VACCINATION STATUS: Immunization History  Administered Date(s) Administered   Influenza-Unspecified 05/13/2014    Diabetes He presents for his follow-up diabetic visit. He has type 2 diabetes mellitus. Onset time: He was diagnosed at approximate age of 50 years. His disease course has been stable. There are no hypoglycemic associated symptoms. Pertinent negatives for hypoglycemia include no confusion, headaches or pallor. Associated symptoms include foot paresthesias. Pertinent negatives for diabetes include no fatigue, no polydipsia, no polyphagia, no polyuria and no weight loss. Hypoglycemia complications include nocturnal hypoglycemia. Symptoms are stable. Diabetic complications include  a CVA, heart disease, nephropathy and peripheral neuropathy. Risk factors for coronary artery disease include diabetes mellitus, dyslipidemia, hypertension, male sex, tobacco exposure, sedentary lifestyle and obesity. Current diabetic treatment includes insulin injections (and Ozempic). He is compliant with treatment most of the time. His weight is increasing steadily. He is following a generally healthy diet. When asked about meal planning, he reported none. He has had a previous visit with a dietitian. He participates in exercise intermittently. His home blood glucose trend is fluctuating minimally. His overall blood glucose range is 130-140 mg/dl. (He presents today with his CGM showing at target if not tight glycemic profile overall.  His POCT A1c today is 6.4%, increasing from last visit of 6%.  Analysis of his CGM shows TIR 85%, TAR 14%, TBR 1% with a GMI of 6.5%.   He does continue to have mild random hypoglycemia noted.  He has been injecting 40 units of Toujeo instead of the 20 units recommended.  He admits to indulging in sweets more than he should, has picked up drinking soda once again.  ) An ACE inhibitor/angiotensin II receptor blocker is being taken. He does not see a  podiatrist.Eye exam is current.  Hyperlipidemia This is a chronic problem. The current episode started more than 1 year ago. The problem is uncontrolled. Recent lipid tests were reviewed and are variable. Exacerbating diseases include diabetes, hypothyroidism and obesity. He has no history of chronic renal disease. Factors aggravating his hyperlipidemia include fatty foods. Associated symptoms include myalgias. Current antihyperlipidemic treatment includes statins. The current treatment provides moderate improvement of lipids. Compliance problems include adherence to diet and adherence to exercise.  Risk factors for coronary artery disease include family history, dyslipidemia, diabetes mellitus, hypertension, male sex, a sedentary lifestyle and obesity.  Thyroid Problem Presents for follow-up (He has longstanding hypothyroidism, currently on levothyroxine 150 mcg p.o. daily before breakfast.  He reports compliance with medication.) visit. Patient reports no cold intolerance, constipation, depressed mood, diarrhea, fatigue, heat intolerance, palpitations, weight gain or weight loss. The symptoms have been stable. His past medical history is significant for diabetes and hyperlipidemia.     Review of systems  Constitutional: + slowly increasing body weight,  current Body mass index is 36.77 kg/m. , + fatigue, no subjective hyperthermia, no subjective hypothermia Eyes: no blurry vision, no xerophthalmia ENT: no sore throat, no nodules palpated in throat, no dysphagia/odynophagia, no hoarseness Cardiovascular: no chest pain, no shortness of breath, no palpitations, no leg swelling Respiratory: no cough, no shortness of breath Gastrointestinal: no nausea/vomiting/diarrhea Musculoskeletal: diffuse muscle/joint aches Skin: no rashes, no hyperemia Neurological: no tremors, no numbness, no tingling, no dizziness Psychiatric: no depression, no anxiety   Objective:    BP 91/60 (BP Location: Right Arm,  Patient Position: Sitting, Cuff Size: Large)   Pulse 84   Ht 5\' 9"  (1.753 m)   Wt 249 lb (112.9 kg)   BMI 36.77 kg/m   Wt Readings from Last 3 Encounters:  04/30/23 249 lb (112.9 kg)  12/24/22 247 lb 9.6 oz (112.3 kg)  08/24/22 242 lb (109.8 kg)   BP Readings from Last 3 Encounters:  04/30/23 91/60  12/24/22 111/69  08/24/22 95/62     Physical Exam- Limited  Constitutional:  Body mass index is 36.77 kg/m. , not in acute distress, normal state of mind Eyes:  EOMI, no exophthalmos Musculoskeletal: no gross deformities, strength intact in all four extremities, no gross restriction of joint movements Skin:  no rashes, no hyperemia Neurological: no tremor with  outstretched hands   Diabetic Foot Exam - Simple   Simple Foot Form Diabetic Foot exam was performed with the following findings: Yes 04/30/2023 11:43 AM  Visual Inspection No deformities, no ulcerations, no other skin breakdown bilaterally: Yes Sensation Testing Intact to touch and monofilament testing bilaterally: Yes Pulse Check Posterior Tibialis and Dorsalis pulse intact bilaterally: Yes Comments     Results for orders placed or performed in visit on 04/30/23  HgB A1c  Result Value Ref Range   Hemoglobin A1C 6.4 (A) 4.0 - 5.6 %   HbA1c POC (<> result, manual entry)     HbA1c, POC (prediabetic range)     HbA1c, POC (controlled diabetic range)     Lab Results  Component Value Date   WBC 11.9 (H) 01/31/2021   HGB 14.1 01/31/2021   HCT 43.5 01/31/2021   MCV 91.6 01/31/2021   PLT 227 01/31/2021    Lab Results  Component Value Date   HGBA1C 6.4 (A) 04/30/2023   HGBA1C 6.0 (A) 12/24/2022   HGBA1C 6.1 (A) 08/24/2022   MICROALBUR 80 01/22/2022   MICROALBUR 77.4 02/26/2020     Lipid Panel     Component Value Date/Time   CHOL 172 10/02/2021 1419   TRIG 295 (H) 10/02/2021 1419   TRIG 329 10/02/2007 0000   HDL 38 (L) 10/02/2021 1419   CHOLHDL 4.5 10/02/2021 1419   CHOLHDL 4.9 02/26/2020 1002   VLDL  19 09/09/2018 0949   LDLCALC 85 10/02/2021 1419   LDLCALC 136 (H) 02/26/2020 1002   LDLCALC 66 10/02/2007 0000     Assessment & Plan:   1) Type 2 diabetes mellitus with stage 3 chronic kidney disease, with long-term current use of insulin   He presents today with his CGM showing at target if not tight glycemic profile overall.  His POCT A1c today is 6.4%, increasing from last visit of 6%.  Analysis of his CGM shows TIR 85%, TAR 14%, TBR 1% with a GMI of 6.5%.   He does continue to have mild random hypoglycemia noted.  He has been injecting 40 units of Toujeo instead of the 20 units recommended.  He admits to indulging in sweets more than he should, has picked up drinking soda once again.     Recent labs reviewed.  - His diabetes is complicated by CAD, CKD, CVA, Retinopathy. Patient remains at a high risk for more acute and chronic complications of diabetes which include CAD, CVA, CKD, retinopathy, and neuropathy. These are all discussed in detail with the patient.  - Nutritional counseling repeated at each appointment due to patients tendency to fall back in to old habits.  - The patient admits there is a room for improvement in their diet and drink choices. -  Suggestion is made for the patient to avoid simple carbohydrates from their diet including Cakes, Sweet Desserts / Pastries, Ice Cream, Soda (diet and regular), Sweet Tea, Candies, Chips, Cookies, Sweet Pastries, Store Bought Juices, Alcohol in Excess of 1-2 drinks a day, Artificial Sweeteners, Coffee Creamer, and "Sugar-free" Products. This will help patient to have stable blood glucose profile and potentially avoid unintended weight gain.   - I encouraged the patient to switch to unprocessed or minimally processed complex starch and increased protein intake (animal or plant source), fruits, and vegetables.   - Patient is advised to stick to a routine mealtimes to eat 3 meals a day and avoid unnecessary snacks (to snack only to  correct hypoglycemia).  -He is advised to decrease  his Toujeo to 30 units SQ nightly and continue Ozempic 1 mg SQ weekly (may increase to the 2 mg dose next visit if needed).  -He is encouraged to continue using his CGM to monitor glucose 4 times daily, before meals and before bed, and to call the clinic if he has readings less than 70 or greater than 200 for 3 tests in a row.  -Due to CKD patient is not a candidate for Metformin , SGLT2i.  -Target numbers for A1c, LDL, HDL, Triglycerides, Waist Circumference were discussed in detail.  2) Lipids/HPL:  His recent lipid panel from 10/02/21 shows controlled LDL of 73 and elevated triglycerides of 295.  He is advised to continue Crestor 5 mg p.o. daily at bedtime, Omega-3 fatty acid supplement, and Fenofibrate 160 mg p.o. daily.  Side effects and precautions discussed with him.    3) Hypothyroidism:   -His previsit TFTs are consistent with appropriate hormone replacement. He is advised to continue his Levothyroxine 175 mcg po daily before breakfast.    - We discussed about the correct intake of his thyroid hormone, on empty stomach at fasting, with water, separated by at least 30 minutes from breakfast and other medications,  and separated by more than 4 hours from calcium, iron, multivitamins, acid reflux medications (PPIs). -Patient is made aware of the fact that thyroid hormone replacement is needed for life, dose to be adjusted by periodic monitoring of thyroid function tests.  4) Hypertension/BP- His blood pressure is controlled to target.  He is advised to continue Losartan 50 mg po daily.  5) Vitamin D Deficiency His most recent vitamin D level was 22.2 on 10/02/21.  He takes an OTC supplement.  He could not remember to take his Ergocalciferol weekly, therefore he stopped it.  6) Hypercalcemia- resolved His calcium level was 11.5 on 10/02/21, worsening from previous reading.  This is the second occurrence of high calcium levels.  He denies  any personal or family history of thyroid cancer, adrenal problems, parathyroid problems, kidney stones, or fragility fractures.  His repeat calcium levels returned to normal of 10. Phosphorous and Magnesium was also WNL.  PTH was normal at 21, PTH-rp negative.  His urine studies were also unremarkable, ruling out [facility].  Given his labs have returned to normal, no need for further investigation at this time.       I advised patient to maintain close follow up with his PCP for primary care needs.     I spent  48  minutes in the care of the patient today including review of labs from CMP, Lipids, Thyroid Function, Hematology (current and previous including abstractions from other facilities); face-to-face time discussing  his blood glucose readings/logs, discussing hypoglycemia and hyperglycemia episodes and symptoms, medications doses, his options of short and long term treatment based on the latest standards of care / guidelines;  discussion about incorporating lifestyle medicine;  and documenting the encounter. Risk reduction counseling performed per USPSTF guidelines to reduce obesity and cardiovascular risk factors.     Please refer to Patient Instructions for Blood Glucose Monitoring and Insulin/Medications Dosing Guide"  in media tab for additional information. Please  also refer to " Patient Self Inventory" in the Media  tab for reviewed elements of pertinent patient history.  [name] participated in the discussions, expressed understanding, and voiced agreement with the above plans.  All questions were answered to his satisfaction. he is encouraged to contact clinic should he have any questions or concerns prior to  his return visit.    Follow up plan: Return in about 4 months (around 08/30/2023) for Diabetes F/U with A1c in office, No previsit labs, Bring meter and logs.   Ronny Bacon, [facility] [address] [place], [place]  [address] Phone: [phone] Fax: [phone]   04/30/2023, 11:54 AM

## 2023-05-14 DIAGNOSIS — Z794 Long term (current) use of insulin: Secondary | ICD-10-CM | POA: Diagnosis not present

## 2023-05-14 DIAGNOSIS — J449 Chronic obstructive pulmonary disease, unspecified: Secondary | ICD-10-CM | POA: Diagnosis not present

## 2023-05-14 DIAGNOSIS — G720 Drug-induced myopathy: Secondary | ICD-10-CM | POA: Diagnosis not present

## 2023-05-14 DIAGNOSIS — E1159 Type 2 diabetes mellitus with other circulatory complications: Secondary | ICD-10-CM | POA: Diagnosis not present

## 2023-05-14 DIAGNOSIS — N1831 Chronic kidney disease, stage 3a: Secondary | ICD-10-CM | POA: Diagnosis not present

## 2023-05-14 DIAGNOSIS — Z23 Encounter for immunization: Secondary | ICD-10-CM | POA: Diagnosis not present

## 2023-05-14 DIAGNOSIS — E1165 Type 2 diabetes mellitus with hyperglycemia: Secondary | ICD-10-CM | POA: Diagnosis not present

## 2023-05-31 DIAGNOSIS — E1159 Type 2 diabetes mellitus with other circulatory complications: Secondary | ICD-10-CM | POA: Diagnosis not present

## 2023-05-31 DIAGNOSIS — N1831 Chronic kidney disease, stage 3a: Secondary | ICD-10-CM | POA: Diagnosis not present

## 2023-06-10 DIAGNOSIS — Z1212 Encounter for screening for malignant neoplasm of rectum: Secondary | ICD-10-CM | POA: Diagnosis not present

## 2023-06-10 DIAGNOSIS — Z1211 Encounter for screening for malignant neoplasm of colon: Secondary | ICD-10-CM | POA: Diagnosis not present

## 2023-06-17 DIAGNOSIS — E119 Type 2 diabetes mellitus without complications: Secondary | ICD-10-CM | POA: Diagnosis not present

## 2023-06-20 LAB — COLOGUARD: COLOGUARD: NEGATIVE

## 2023-06-27 ENCOUNTER — Other Ambulatory Visit (HOSPITAL_COMMUNITY): Payer: Self-pay | Admitting: Nephrology

## 2023-06-27 DIAGNOSIS — E1122 Type 2 diabetes mellitus with diabetic chronic kidney disease: Secondary | ICD-10-CM | POA: Diagnosis not present

## 2023-06-27 DIAGNOSIS — N1831 Chronic kidney disease, stage 3a: Secondary | ICD-10-CM | POA: Diagnosis not present

## 2023-06-27 DIAGNOSIS — R8290 Unspecified abnormal findings in urine: Secondary | ICD-10-CM | POA: Diagnosis not present

## 2023-06-27 DIAGNOSIS — N189 Chronic kidney disease, unspecified: Secondary | ICD-10-CM | POA: Diagnosis not present

## 2023-06-27 DIAGNOSIS — I10 Essential (primary) hypertension: Secondary | ICD-10-CM | POA: Diagnosis not present

## 2023-07-02 ENCOUNTER — Ambulatory Visit (HOSPITAL_COMMUNITY)
Admission: RE | Admit: 2023-07-02 | Discharge: 2023-07-02 | Disposition: A | Discharge status: Home / Self Care | Payer: 59 | Source: Ambulatory Visit | Attending: Nephrology | Admitting: Nephrology

## 2023-07-02 DIAGNOSIS — R8290 Unspecified abnormal findings in urine: Secondary | ICD-10-CM | POA: Insufficient documentation

## 2023-07-02 DIAGNOSIS — N1831 Chronic kidney disease, stage 3a: Secondary | ICD-10-CM | POA: Diagnosis not present

## 2023-07-02 DIAGNOSIS — N189 Chronic kidney disease, unspecified: Secondary | ICD-10-CM | POA: Diagnosis not present

## 2023-07-04 ENCOUNTER — Other Ambulatory Visit: Payer: Self-pay | Admitting: *Deleted

## 2023-07-04 DIAGNOSIS — M5134 Other intervertebral disc degeneration, thoracic region: Secondary | ICD-10-CM | POA: Diagnosis not present

## 2023-07-04 DIAGNOSIS — S2232XA Fracture of one rib, left side, initial encounter for closed fracture: Secondary | ICD-10-CM | POA: Diagnosis not present

## 2023-07-04 DIAGNOSIS — I10 Essential (primary) hypertension: Secondary | ICD-10-CM | POA: Diagnosis not present

## 2023-07-04 DIAGNOSIS — N1831 Chronic kidney disease, stage 3a: Secondary | ICD-10-CM | POA: Diagnosis not present

## 2023-07-04 DIAGNOSIS — E782 Mixed hyperlipidemia: Secondary | ICD-10-CM | POA: Diagnosis not present

## 2023-07-04 DIAGNOSIS — G720 Drug-induced myopathy: Secondary | ICD-10-CM | POA: Diagnosis not present

## 2023-07-04 DIAGNOSIS — E1159 Type 2 diabetes mellitus with other circulatory complications: Secondary | ICD-10-CM | POA: Diagnosis not present

## 2023-07-04 DIAGNOSIS — J449 Chronic obstructive pulmonary disease, unspecified: Secondary | ICD-10-CM | POA: Diagnosis not present

## 2023-07-04 DIAGNOSIS — N183 Chronic kidney disease, stage 3 unspecified: Secondary | ICD-10-CM | POA: Diagnosis not present

## 2023-07-04 DIAGNOSIS — I679 Cerebrovascular disease, unspecified: Secondary | ICD-10-CM

## 2023-07-15 ENCOUNTER — Ambulatory Visit: Payer: 59 | Attending: Cardiology | Admitting: Cardiology

## 2023-07-15 ENCOUNTER — Encounter: Payer: Self-pay | Admitting: Cardiology

## 2023-07-15 VITALS — BP 130/78 | HR 92 | Ht 69.0 in | Wt 236.0 lb

## 2023-07-15 DIAGNOSIS — E782 Mixed hyperlipidemia: Secondary | ICD-10-CM | POA: Diagnosis not present

## 2023-07-15 DIAGNOSIS — I6523 Occlusion and stenosis of bilateral carotid arteries: Secondary | ICD-10-CM

## 2023-07-15 DIAGNOSIS — I2510 Atherosclerotic heart disease of native coronary artery without angina pectoris: Secondary | ICD-10-CM | POA: Diagnosis not present

## 2023-07-15 DIAGNOSIS — I10 Essential (primary) hypertension: Secondary | ICD-10-CM | POA: Diagnosis not present

## 2023-07-15 NOTE — Patient Instructions (Signed)

## 2023-07-15 NOTE — Progress Notes (Signed)
Clinical Summary [name] is a 67 y.o.male former patient of [name], this is our first visit together. Seen for the following medical problems.  1.CAD - prior CABG in 2002, initially presented with MI 06/2020 echo: LVEF 55%, indet diastolic, apical anteroseptal akinesis,  - 09/2020 nuclear stress anterior scar, no ischemia  - no recent chest pains, no SOB/DOE   2. Carotid stenosis - prior left CEA - 07/2022 carotid US: RICA 1-39%, LICA 1-39%. - repeat US set for next week   3. HTN - pcp stopped losartan, had started norvasc - compliant with meds   4. HLD 03/2022: TC 93 TG 119 HDL 33 LDL 38  5. CKD - followed by nephrology   6. COPD  7. DM2 - followed by endocrine.  - 04/2023 A1C 6.4   SH: Friend is [name], also patient of mine.  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   Past Medical History:  Diagnosis Date   ASCVD (arteriosclerotic cardiovascular disease)    -MI in 01/2001 prompted CABG; EF-30% at cath; 50% on echo in 7/02 and normal in 2005   Asthma    Cerebrovascular disease     L CEA 06/2004 following left renal embolism; 10/2008 plaque w/o focal stenosis   Chronic back pain    Chronic leg pain    Chronic obstructive pulmonary disease (HCC)    Degenerative joint disease     chronic LBP-s/p L3-4 fusion   Diabetes mellitus    -no insulin   Hyperlipidemia    Hypertension    Hypothyroidism    Obesity    Restless leg syndrome    Possible   Tobacco abuse, in remission    50 pack years; discontinued in 2002     Allergies  Allergen Reactions   Alprazolam Nausea And Vomiting and Other (See Comments)    Altered mental status   Atorvastatin Nausea And Vomiting   Gemfibrozil     unknown   Invokana [Canagliflozin] Diarrhea and Nausea And Vomiting   Definity [Perflutren Lipid Microsphere] Other (See Comments)    Headache     Current Outpatient Medications  Medication Sig Dispense Refill   albuterol (VENTOLIN HFA) 108 (90  Base) MCG/ACT inhaler Inhale 1-2 puffs into the lungs every 6 (six) hours as needed for shortness of breath or wheezing.     albuterol-ipratropium (COMBIVENT) 18-103 MCG/ACT inhaler Inhale 2 puffs into the lungs every 6 (six) hours as needed for wheezing or shortness of breath.      amLODipine (NORVASC) 5 MG tablet Take 5 mg by mouth daily.     Ascorbic Acid (VITAMIN C) 1000 MG tablet Take 1,000 mg by mouth daily.     aspirin 81 MG tablet Take 81 mg by mouth daily.       B Complex-Biotin-FA (VITAMIN B50 COMPLEX PO) Take 1 capsule by mouth daily.     Cholecalciferol (VITAMIN D PO) Take 1 capsule by mouth daily.     Continuous Blood Gluc Receiver (FREESTYLE LIBRE 2 READER) DEVI Use to monitor glucose 4 times daily 1 each 0   Continuous Glucose Sensor (FREESTYLE LIBRE 2 SENSOR) MISC USE AS DIRECTED TO MONITOR BLOOD SUGAR (4) TIMES DAILY. 2 each 3   diazepam (VALIUM) 10 MG tablet Take 5 mg by mouth at bedtime as needed for anxiety.     docusate sodium (COLACE) 100 MG capsule Take 200 mg by mouth daily.     fenofibrate 160 MG tablet Take 160 mg by mouth daily.  fish oil-omega-3 fatty acids 1000 MG capsule Take 2 g by mouth daily.     gabapentin (NEURONTIN) 300 MG capsule Take 300 mg by mouth at bedtime.     glucose blood (ACCU-CHEK AVIVA PLUS) test strip Use as directed to check blood glucose four times daily 400 strip 1   HYDROmorphone (DILAUDID) 4 MG tablet Take 4 mg by mouth 3 (three) times daily.     insulin glargine, 2 Unit Dial, (TOUJEO MAX SOLOSTAR) 300 UNIT/ML Solostar Pen Inject 30 Units into the skin at bedtime. 9 mL 3   Insulin Pen Needle (SURE COMFORT PEN NEEDLES) 31G X 8 MM MISC USE AS DIRECTED WITH LANTUS AND NOVOLOG UP TO FOUR TIMES DAILY. 100 each 2   levothyroxine (SYNTHROID) 175 MCG tablet Take 1 tablet (175 mcg total) by mouth daily before breakfast. 90 tablet 1   losartan (COZAAR) 50 MG tablet TAKE ONE TABLET BY MOUTH ONCE DAILY. 90 tablet 3   Multiple Vitamins-Minerals  (MULTIVITAMIN WITH MINERALS) tablet Take 1 tablet by mouth daily.       nitroGLYCERIN (NITROSTAT) 0.4 MG SL tablet Place 0.4 mg under the tongue every 5 (five) minutes as needed for chest pain.     OVER THE COUNTER MEDICATION Vitamin B 50 - patient reports that he takes daily.     rosuvastatin (CRESTOR) 40 MG tablet TAKE 1 TABLET BY MOUTH DAILY. KEEP OV. 90 tablet 2   Semaglutide, 1 MG/DOSE, (OZEMPIC, 1 MG/DOSE,) 4 MG/3ML SOPN Inject 1 mg into the skin once a week. 6 mL 1   No current facility-administered medications for this visit.     Past Surgical History:  Procedure Laterality Date   CAROTID ENDARTERECTOMY  08/14/2003   Left   CATARACT EXTRACTION     bilateral   CHOLECYSTECTOMY N/A 02/06/2021   Procedure: LAPAROSCOPIC CHOLECYSTECTOMY;  Surgeon: Franky Macho, MD;  Location: [facility];  Service: General;  Laterality: N/A;   CORONARY ARTERY BYPASS GRAFT  08/13/2000   GALLBLADDER SURGERY  02/07/2021   LUMBAR SPINE SURGERY     L3-4 fusion     Allergies  Allergen Reactions   Alprazolam Nausea And Vomiting and Other (See Comments)    Altered mental status   Atorvastatin Nausea And Vomiting   Gemfibrozil     unknown   Invokana [Canagliflozin] Diarrhea and Nausea And Vomiting   Definity [Perflutren Lipid Microsphere] Other (See Comments)    Headache      History reviewed. No pertinent family history.   Social History [name] reports that he quit smoking about 22 years ago. His smoking use included cigarettes. He has never used smokeless tobacco. [name] reports no history of alcohol use.   Review of Systems CONSTITUTIONAL: No weight loss, fever, chills, weakness or fatigue.  HEENT: Eyes: No visual loss, blurred vision, double vision or yellow sclerae.No hearing loss, sneezing, congestion, runny nose or sore throat.  SKIN: No rash or itching.  CARDIOVASCULAR: per hpi RESPIRATORY: No shortness of breath, cough or sputum.  GASTROINTESTINAL: No anorexia, nausea,  vomiting or diarrhea. No abdominal pain or blood.  GENITOURINARY: No burning on urination, no polyuria NEUROLOGICAL: No headache, dizziness, syncope, paralysis, ataxia, numbness or tingling in the extremities. No change in bowel or bladder control.  MUSCULOSKELETAL: No muscle, back pain, joint pain or stiffness.  LYMPHATICS: No enlarged nodes. No history of splenectomy.  PSYCHIATRIC: No history of depression or anxiety.  ENDOCRINOLOGIC: No reports of sweating, cold or heat intolerance. No polyuria or polydipsia.  Marland Kitchen  Physical Examination Vitals:   07/15/23 1057 07/15/23 1134  BP: (!) 140/84 130/78  Pulse: 92   SpO2: 96%    Filed Weights   07/15/23 1057  Weight: 236 lb (107 kg)    Gen: resting comfortably, no acute distress HEENT: no scleral icterus, pupils equal round and reactive, no palptable cervical adenopathy,  CV: RRR, no m/rg, no jvd Resp: Clear to auscultation bilaterally GI: abdomen is soft, non-tender, non-distended, normal bowel sounds, no hepatosplenomegaly MSK: extremities are warm, no edema.  Skin: warm, no rash Neuro:  no focal deficits Psych: appropriate affect    Assessment and Plan  1.CAD - no recent symptoms, continue current meds - EKG today shows SR, no ischemic changes  2. HTN - at goal, continue current meds  3. HLD - has been at goal, continue current meds  4. Carotid stenosis - due for repeat US next week, f/u results - continue current meds   F/u 6 months      Antoine Poche, M.D.

## 2023-07-22 ENCOUNTER — Ambulatory Visit: Payer: 59 | Attending: Cardiology

## 2023-07-22 DIAGNOSIS — I679 Cerebrovascular disease, unspecified: Secondary | ICD-10-CM

## 2023-07-30 ENCOUNTER — Other Ambulatory Visit: Payer: Self-pay | Admitting: *Deleted

## 2023-07-30 ENCOUNTER — Encounter: Payer: Self-pay | Admitting: *Deleted

## 2023-07-30 DIAGNOSIS — I6523 Occlusion and stenosis of bilateral carotid arteries: Secondary | ICD-10-CM

## 2023-07-30 DIAGNOSIS — I679 Cerebrovascular disease, unspecified: Secondary | ICD-10-CM

## 2023-08-26 ENCOUNTER — Telehealth: Payer: Self-pay | Admitting: *Deleted

## 2023-08-26 NOTE — Telephone Encounter (Signed)
 Patient left a message asking if he would need lab work prior to his appointment on January 20th. Reviewed the patient's last OV note. He does not need previsit labs prior to this visit. He will have A1C at the time of office visit. Patient was called and made aware.

## 2023-08-28 DIAGNOSIS — J449 Chronic obstructive pulmonary disease, unspecified: Secondary | ICD-10-CM | POA: Diagnosis not present

## 2023-08-28 DIAGNOSIS — G720 Drug-induced myopathy: Secondary | ICD-10-CM | POA: Diagnosis not present

## 2023-08-28 DIAGNOSIS — M5134 Other intervertebral disc degeneration, thoracic region: Secondary | ICD-10-CM | POA: Diagnosis not present

## 2023-08-28 DIAGNOSIS — N183 Chronic kidney disease, stage 3 unspecified: Secondary | ICD-10-CM | POA: Diagnosis not present

## 2023-08-28 DIAGNOSIS — I10 Essential (primary) hypertension: Secondary | ICD-10-CM | POA: Diagnosis not present

## 2023-08-28 DIAGNOSIS — M1991 Primary osteoarthritis, unspecified site: Secondary | ICD-10-CM | POA: Diagnosis not present

## 2023-08-28 DIAGNOSIS — E1159 Type 2 diabetes mellitus with other circulatory complications: Secondary | ICD-10-CM | POA: Diagnosis not present

## 2023-08-28 DIAGNOSIS — N1831 Chronic kidney disease, stage 3a: Secondary | ICD-10-CM | POA: Diagnosis not present

## 2023-08-28 DIAGNOSIS — E1165 Type 2 diabetes mellitus with hyperglycemia: Secondary | ICD-10-CM | POA: Diagnosis not present

## 2023-08-28 DIAGNOSIS — E1122 Type 2 diabetes mellitus with diabetic chronic kidney disease: Secondary | ICD-10-CM | POA: Diagnosis not present

## 2023-08-30 ENCOUNTER — Other Ambulatory Visit: Payer: Self-pay | Admitting: Nurse Practitioner

## 2023-09-02 ENCOUNTER — Encounter: Payer: Self-pay | Admitting: Nurse Practitioner

## 2023-09-02 ENCOUNTER — Ambulatory Visit (INDEPENDENT_AMBULATORY_CARE_PROVIDER_SITE_OTHER): Payer: 59 | Admitting: Nurse Practitioner

## 2023-09-02 VITALS — BP 116/76 | HR 82 | Ht 69.0 in | Wt 256.8 lb

## 2023-09-02 DIAGNOSIS — E1122 Type 2 diabetes mellitus with diabetic chronic kidney disease: Secondary | ICD-10-CM | POA: Diagnosis not present

## 2023-09-02 DIAGNOSIS — I10 Essential (primary) hypertension: Secondary | ICD-10-CM

## 2023-09-02 DIAGNOSIS — Z7985 Long-term (current) use of injectable non-insulin antidiabetic drugs: Secondary | ICD-10-CM

## 2023-09-02 DIAGNOSIS — N183 Chronic kidney disease, stage 3 unspecified: Secondary | ICD-10-CM

## 2023-09-02 DIAGNOSIS — E1159 Type 2 diabetes mellitus with other circulatory complications: Secondary | ICD-10-CM | POA: Diagnosis not present

## 2023-09-02 DIAGNOSIS — Z794 Long term (current) use of insulin: Secondary | ICD-10-CM

## 2023-09-02 DIAGNOSIS — E039 Hypothyroidism, unspecified: Secondary | ICD-10-CM | POA: Diagnosis not present

## 2023-09-02 DIAGNOSIS — E782 Mixed hyperlipidemia: Secondary | ICD-10-CM

## 2023-09-02 DIAGNOSIS — E559 Vitamin D deficiency, unspecified: Secondary | ICD-10-CM | POA: Diagnosis not present

## 2023-09-02 LAB — POCT GLYCOSYLATED HEMOGLOBIN (HGB A1C): Hemoglobin A1C: 7 % — AB (ref 4.0–5.6)

## 2023-09-02 MED ORDER — TOUJEO MAX SOLOSTAR 300 UNIT/ML ~~LOC~~ SOPN: 20.0000 [IU] | PEN_INJECTOR | Freq: Every day | SUBCUTANEOUS | 3 refills | Status: DC

## 2023-09-02 MED ORDER — SURE COMFORT PEN NEEDLES 31G X 8 MM MISC: 2 refills | Status: DC

## 2023-09-02 MED ORDER — SEMAGLUTIDE (2 MG/DOSE) 8 MG/3ML ~~LOC~~ SOPN: 2.0000 mg | PEN_INJECTOR | SUBCUTANEOUS | 1 refills | Status: DC

## 2023-09-02 MED ORDER — FREESTYLE LIBRE 2 SENSOR MISC: 6 refills | Status: DC

## 2023-09-02 NOTE — Progress Notes (Signed)
09/02/2023   Endocrinology follow-up note   Subjective:    Patient ID: [name], male    DOB: [birth year],    Past Medical History:  Diagnosis Date   ASCVD (arteriosclerotic cardiovascular disease)    -MI in 01/2001 prompted CABG; EF-30% at cath; 50% on echo in 7/02 and normal in 2005   Asthma    Cerebrovascular disease     L CEA 06/2004 following left renal embolism; 10/2008 plaque w/o focal stenosis   Chronic back pain    Chronic leg pain    Chronic obstructive pulmonary disease (HCC)    Degenerative joint disease     chronic LBP-s/p L3-4 fusion   Diabetes mellitus    -no insulin   Hyperlipidemia    Hypertension    Hypothyroidism    Obesity    Restless leg syndrome    Possible   Tobacco abuse, in remission    50 pack years; discontinued in 2002   Past Surgical History:  Procedure Laterality Date   CAROTID ENDARTERECTOMY  08/14/2003   Left   CATARACT EXTRACTION     bilateral   CHOLECYSTECTOMY N/A 02/06/2021   Procedure: LAPAROSCOPIC CHOLECYSTECTOMY;  Surgeon: Franky Macho, MD;  Location: [facility];  Service: General;  Laterality: N/A;   CORONARY ARTERY BYPASS GRAFT  08/13/2000   GALLBLADDER SURGERY  02/07/2021   LUMBAR SPINE SURGERY     L3-4 fusion   Social History   Socioeconomic History   Marital status: Divorced    Spouse name: Not on file   Number of children: 1   Years of education: Not on file   Highest education level: Not on file  Occupational History   Occupation: veteran    Comment: disabledf  Tobacco Use   Smoking status: Former    Current packs/day: 0.00    Types: Cigarettes    Quit date: 01/15/2001    Years since quitting: 22.6   Smokeless tobacco: Never  Vaping Use   Vaping status: Never Used  Substance and Sexual Activity   Alcohol use: No   Drug use: No   Sexual activity: Not Currently  Other Topics Concern   Not on file  Social History Narrative   Not on file   Social Drivers of Health   Financial Resource Strain:  Low Risk  (04/10/2022)   Overall Financial Resource Strain (CARDIA)    Difficulty of Paying Living Expenses: Not hard at all  Food Insecurity: Not on file  Transportation Needs: No Transportation Needs (04/10/2022)   PRAPARE - Transportation    Lack of Transportation (Medical): No    Lack of Transportation (Non-Medical): No  Physical Activity: Not on file  Stress: Not on file  Social Connections: Unknown (12/26/2021)   [facility]   Social Network    Social Network: Not on file   Outpatient Encounter Medications as of 09/02/2023  Medication Sig   albuterol (VENTOLIN HFA) 108 (90 Base) MCG/ACT inhaler Inhale 1-2 puffs into the lungs every 6 (six) hours as needed for shortness of breath or wheezing.   albuterol-ipratropium (COMBIVENT) 18-103 MCG/ACT inhaler Inhale 2 puffs into the lungs every 6 (six) hours as needed for wheezing or shortness of breath.    amLODipine (NORVASC) 5 MG tablet Take 5 mg by mouth daily.   Ascorbic Acid (VITAMIN C) 1000 MG tablet Take 1,000 mg by mouth daily.   aspirin 81 MG tablet Take 81 mg by mouth daily.     B Complex-Biotin-FA (VITAMIN  B50 COMPLEX PO) Take 1 capsule by mouth daily.   Cholecalciferol (VITAMIN D PO) Take 1 capsule by mouth daily.   Continuous Blood Gluc Receiver (FREESTYLE LIBRE 2 READER) DEVI Use to monitor glucose 4 times daily   diazepam (VALIUM) 10 MG tablet Take 5 mg by mouth at bedtime as needed for anxiety.   docusate sodium (COLACE) 100 MG capsule Take 200 mg by mouth daily.   fenofibrate 160 MG tablet Take 160 mg by mouth daily.   fish oil-omega-3 fatty acids 1000 MG capsule Take 2 g by mouth daily.   gabapentin (NEURONTIN) 300 MG capsule Take 300 mg by mouth at bedtime.   glucose blood (ACCU-CHEK AVIVA PLUS) test strip Use as directed to check blood glucose four times daily   levothyroxine (SYNTHROID) 175 MCG tablet Take 1 tablet (175 mcg total) by mouth daily before breakfast.   losartan (COZAAR) 50 MG  tablet TAKE ONE TABLET BY MOUTH ONCE DAILY.   Multiple Vitamins-Minerals (MULTIVITAMIN WITH MINERALS) tablet Take 1 tablet by mouth daily.     nitroGLYCERIN (NITROSTAT) 0.4 MG SL tablet Place 0.4 mg under the tongue every 5 (five) minutes as needed for chest pain.   OVER THE COUNTER MEDICATION Vitamin B 50 - patient reports that he takes daily.   rosuvastatin (CRESTOR) 40 MG tablet TAKE 1 TABLET BY MOUTH DAILY. KEEP OV.   Semaglutide, 2 MG/DOSE, 8 MG/3ML SOPN Inject 2 mg as directed once a week.   [DISCONTINUED] Continuous Glucose Sensor (FREESTYLE LIBRE 2 SENSOR) MISC USE AS DIRECTED TO MONITOR BLOOD SUGAR (4) TIMES DAILY.   [DISCONTINUED] insulin glargine, 2 Unit Dial, (TOUJEO MAX SOLOSTAR) 300 UNIT/ML Solostar Pen Inject 30 Units into the skin at bedtime.   [DISCONTINUED] OZEMPIC, 1 MG/DOSE, 4 MG/3ML SOPN Inject 1 mg into the skin once a week.   Continuous Glucose Sensor (FREESTYLE LIBRE 2 SENSOR) MISC USE AS DIRECTED TO MONITOR BLOOD SUGAR (4) TIMES DAILY.   HYDROmorphone (DILAUDID) 4 MG tablet Take 4 mg by mouth 3 (three) times daily. (Patient not taking: Reported on 09/02/2023)   insulin glargine, 2 Unit Dial, (TOUJEO MAX SOLOSTAR) 300 UNIT/ML Solostar Pen Inject 20 Units into the skin at bedtime.   Insulin Pen Needle (SURE COMFORT PEN NEEDLES) 31G X 8 MM MISC Use to inject insulin once daily   [DISCONTINUED] Insulin Pen Needle (SURE COMFORT PEN NEEDLES) 31G X 8 MM MISC USE AS DIRECTED WITH LANTUS AND NOVOLOG UP TO FOUR TIMES DAILY.   [DISCONTINUED] Semaglutide, 1 MG/DOSE, (OZEMPIC, 1 MG/DOSE,) 4 MG/3ML SOPN Inject 1 mg into the skin once a week.   No facility-administered encounter medications on file as of 09/02/2023.   ALLERGIES: Allergies  Allergen Reactions   Alprazolam Nausea And Vomiting and Other (See Comments)    Altered mental status   Atorvastatin Nausea And Vomiting   Gemfibrozil     unknown   Invokana [Canagliflozin] Diarrhea and Nausea And Vomiting   Definity [Perflutren  Lipid Microsphere] Other (See Comments)    Headache   VACCINATION STATUS: Immunization History  Administered Date(s) Administered   Influenza-Unspecified 05/13/2014    Diabetes He presents for his follow-up diabetic visit. He has type 2 diabetes mellitus. Onset time: He was diagnosed at approximate age of 50 years. His disease course has been worsening. There are no hypoglycemic associated symptoms. Pertinent negatives for hypoglycemia include no confusion, headaches or pallor. Associated symptoms include foot paresthesias. Pertinent negatives for diabetes include no fatigue, no polydipsia, no polyphagia, no polyuria and no weight  loss. Hypoglycemia complications include nocturnal hypoglycemia. Symptoms are stable. Diabetic complications include a CVA, heart disease, nephropathy and peripheral neuropathy. Risk factors for coronary artery disease include diabetes mellitus, dyslipidemia, hypertension, male sex, tobacco exposure, sedentary lifestyle and obesity. Current diabetic treatment includes insulin injections (and Ozempic). He is compliant with treatment most of the time. His weight is increasing rapidly. He is following a generally healthy diet. When asked about meal planning, he reported none. He has had a previous visit with a dietitian. He participates in exercise intermittently. His home blood glucose trend is increasing steadily. His overall blood glucose range is 140-180 mg/dl. (He presents today with his CGM showing slightly above target glycemic profile.  His POCT A1c today is 7%, increasing from last visit of 6.4%.  Analysis of his CGM shows TIR 77%, TAR 23%, TBR 0% with a GMI of 6.9%.  He notes he recently lost his brother and admits he has not been himself as far as eating habits and exercise.  He also notes he has been snacking to bring up glucose at night to prevent nocturnal hypoglycemia.) An ACE inhibitor/angiotensin II receptor blocker is being taken. He does not see a podiatrist.Eye  exam is current.  Hyperlipidemia This is a chronic problem. The current episode started more than 1 year ago. The problem is uncontrolled. Recent lipid tests were reviewed and are variable. Exacerbating diseases include diabetes, hypothyroidism and obesity. He has no history of chronic renal disease. Factors aggravating his hyperlipidemia include fatty foods. Associated symptoms include myalgias. Current antihyperlipidemic treatment includes statins. The current treatment provides moderate improvement of lipids. Compliance problems include adherence to diet and adherence to exercise.  Risk factors for coronary artery disease include family history, dyslipidemia, diabetes mellitus, hypertension, male sex, a sedentary lifestyle and obesity.  Thyroid Problem Presents for follow-up (He has longstanding hypothyroidism, currently on levothyroxine 150 mcg p.o. daily before breakfast.  He reports compliance with medication.) visit. Patient reports no cold intolerance, constipation, depressed mood, diarrhea, fatigue, heat intolerance, palpitations, weight gain or weight loss. The symptoms have been stable. His past medical history is significant for diabetes and hyperlipidemia.     Review of systems  Constitutional: + rapidly increasing body weight,  current Body mass index is 37.92 kg/m. , + fatigue, no subjective hyperthermia, no subjective hypothermia Eyes: no blurry vision, no xerophthalmia ENT: no sore throat, no nodules palpated in throat, no dysphagia/odynophagia, no hoarseness Cardiovascular: no chest pain, no shortness of breath, no palpitations, no leg swelling Respiratory: no cough, no shortness of breath Gastrointestinal: no nausea/vomiting/diarrhea Musculoskeletal: diffuse muscle/joint aches Skin: no rashes, no hyperemia Neurological: no tremors, no numbness, no tingling, no dizziness Psychiatric: no depression, no anxiety, mourning the recent loss of his brother   Objective:    BP 116/76  (BP Location: Left Arm, Patient Position: Sitting, Cuff Size: Large)   Pulse 82   Ht 5\' 9"  (1.753 m)   Wt 256 lb 12.8 oz (116.5 kg)   BMI 37.92 kg/m   Wt Readings from Last 3 Encounters:  09/02/23 256 lb 12.8 oz (116.5 kg)  07/15/23 236 lb (107 kg)  04/30/23 249 lb (112.9 kg)   BP Readings from Last 3 Encounters:  09/02/23 116/76  07/15/23 130/78  04/30/23 91/60     Physical Exam- Limited  Constitutional:  Body mass index is 37.92 kg/m. , not in acute distress, normal state of mind Eyes:  EOMI, no exophthalmos Musculoskeletal: no gross deformities, strength intact in all four extremities, no gross restriction of  joint movements Skin:  no rashes, no hyperemia Neurological: no tremor with outstretched hands   Diabetic Foot Exam - Simple   No data filed     Results for orders placed or performed in visit on 09/02/23  HgB A1c   Collection Time: 09/02/23  2:41 PM  Result Value Ref Range   Hemoglobin A1C 7.0 (A) 4.0 - 5.6 %   HbA1c POC (<> result, manual entry)     HbA1c, POC (prediabetic range)     HbA1c, POC (controlled diabetic range)     Lab Results  Component Value Date   WBC 11.9 (H) 01/31/2021   HGB 14.1 01/31/2021   HCT 43.5 01/31/2021   MCV 91.6 01/31/2021   PLT 227 01/31/2021    Lab Results  Component Value Date   HGBA1C 7.0 (A) 09/02/2023   HGBA1C 6.4 (A) 04/30/2023   HGBA1C 6.0 (A) 12/24/2022   MICROALBUR 80 01/22/2022   MICROALBUR 77.4 02/26/2020     Lipid Panel     Component Value Date/Time   CHOL 172 10/02/2021 1419   TRIG 295 (H) 10/02/2021 1419   TRIG 329 10/02/2007 0000   HDL 38 (L) 10/02/2021 1419   CHOLHDL 4.5 10/02/2021 1419   CHOLHDL 4.9 02/26/2020 1002   VLDL 19 09/09/2018 0949   LDLCALC 85 10/02/2021 1419   LDLCALC 136 (H) 02/26/2020 1002   LDLCALC 66 10/02/2007 0000     Assessment & Plan:   1) Type 2 diabetes mellitus with stage 3 chronic kidney disease, with long-term current use of insulin   He presents today with  his CGM showing slightly above target glycemic profile.  His POCT A1c today is 7%, increasing from last visit of 6.4%.  Analysis of his CGM shows TIR 77%, TAR 23%, TBR 0% with a GMI of 6.9%.  He notes he recently lost his brother and admits he has not been himself as far as eating habits and exercise.  He also notes he has been snacking to bring up glucose at night to prevent nocturnal hypoglycemia.   Recent labs reviewed.  - His diabetes is complicated by CAD, CKD, CVA, Retinopathy. Patient remains at a high risk for more acute and chronic complications of diabetes which include CAD, CVA, CKD, retinopathy, and neuropathy. These are all discussed in detail with the patient.  - Nutritional counseling repeated at each appointment due to patients tendency to fall back in to old habits.  - The patient admits there is a room for improvement in their diet and drink choices. -  Suggestion is made for the patient to avoid simple carbohydrates from their diet including Cakes, Sweet Desserts / Pastries, Ice Cream, Soda (diet and regular), Sweet Tea, Candies, Chips, Cookies, Sweet Pastries, Store Bought Juices, Alcohol in Excess of 1-2 drinks a day, Artificial Sweeteners, Coffee Creamer, and "Sugar-free" Products. This will help patient to have stable blood glucose profile and potentially avoid unintended weight gain.   - I encouraged the patient to switch to unprocessed or minimally processed complex starch and increased protein intake (animal or plant source), fruits, and vegetables.   - Patient is advised to stick to a routine mealtimes to eat 3 meals a day and avoid unnecessary snacks (to snack only to correct hypoglycemia).  -He is advised to lower Toujeo to 20 units SQ nightly and increase Ozempic to 2 mg SQ weekly.  -He is encouraged to continue using his CGM to monitor glucose 4 times daily, before meals and before bed, and to call  the clinic if he has readings less than 70 or greater than 200 for 3  tests in a row.  -Due to CKD patient is not a candidate for Metformin , SGLT2i.  -Target numbers for A1c, LDL, HDL, Triglycerides, Waist Circumference were discussed in detail.  2) Lipids/HPL:  His recent lipid panel from 10/02/21 shows controlled LDL of 73 and elevated triglycerides of 295.  He is advised to continue Crestor 5 mg p.o. daily at bedtime, Omega-3 fatty acid supplement, and Fenofibrate 160 mg p.o. daily.  Side effects and precautions discussed with him.  Will recheck lipid panel prior to next visit.  3) Hypothyroidism:   -There are no recent TFTs to review. He is advised to continue his Levothyroxine 175 mcg po daily before breakfast.  Will recheck TFTs prior to next visit.   - We discussed about the correct intake of his thyroid hormone, on empty stomach at fasting, with water, separated by at least 30 minutes from breakfast and other medications,  and separated by more than 4 hours from calcium, iron, multivitamins, acid reflux medications (PPIs). -Patient is made aware of the fact that thyroid hormone replacement is needed for life, dose to be adjusted by periodic monitoring of thyroid function tests.  4) Hypertension/BP- His blood pressure is controlled to target.  He is advised to continue Losartan 50 mg po daily.  5) Vitamin D Deficiency His most recent vitamin D level was 22.2 on 10/02/21.  He takes an OTC supplement.  He could not remember to take his Ergocalciferol weekly, therefore he stopped it.  6) Hypercalcemia- resolved His calcium level was 11.5 on 10/02/21, worsening from previous reading.  This is the second occurrence of high calcium levels.  He denies any personal or family history of thyroid cancer, adrenal problems, parathyroid problems, kidney stones, or fragility fractures.  His repeat calcium levels returned to normal of 10. Phosphorous and Magnesium was also WNL.  PTH was normal at 21, PTH-rp negative.  His urine studies were also unremarkable, ruling out  [facility].  Given his labs have returned to normal, no need for further investigation at this time.       I advised patient to maintain close follow up with his PCP for primary care needs.     I spent  40  minutes in the care of the patient today including review of labs from CMP, Lipids, Thyroid Function, Hematology (current and previous including abstractions from other facilities); face-to-face time discussing  his blood glucose readings/logs, discussing hypoglycemia and hyperglycemia episodes and symptoms, medications doses, his options of short and long term treatment based on the latest standards of care / guidelines;  discussion about incorporating lifestyle medicine;  and documenting the encounter. Risk reduction counseling performed per USPSTF guidelines to reduce obesity and cardiovascular risk factors.     Please refer to Patient Instructions for Blood Glucose Monitoring and Insulin/Medications Dosing Guide"  in media tab for additional information. Please  also refer to " Patient Self Inventory" in the Media  tab for reviewed elements of pertinent patient history.  [name] participated in the discussions, expressed understanding, and voiced agreement with the above plans.  All questions were answered to his satisfaction. he is encouraged to contact clinic should he have any questions or concerns prior to his return visit.    Follow up plan: Return in about 3 months (around 12/01/2023) for Diabetes F/U with A1c in office, Thyroid follow up, Previsit labs, Bring meter and logs.   Salisha Bardsley  Lurlean Leyden [facility] [address] [place], [place] [address] Phone: [phone] Fax: [phone]   09/02/2023, 3:26 PM

## 2023-09-02 NOTE — Patient Instructions (Signed)

## 2023-10-01 ENCOUNTER — Telehealth: Payer: Self-pay | Admitting: *Deleted

## 2023-10-01 NOTE — Telephone Encounter (Signed)
Patient presented to the office. He was anxious about his blood sugars being 150. CGM downloaded. Whitney reviewed. Patient had shared that he was using Toujeo 20 units at bedtime Ozempic 2 mg weekly.  Per Alphonzo Lemmings the patient is doing well, he may increase his Toujeo to 25 units at bedtime. He is to contact us with any concerns, and high blood sugar and or low blood sugar.  Patient was advised and verbalized understanding.

## 2023-10-13 ENCOUNTER — Other Ambulatory Visit: Payer: Self-pay | Admitting: Cardiology

## 2023-10-13 DIAGNOSIS — E7800 Pure hypercholesterolemia, unspecified: Secondary | ICD-10-CM

## 2023-10-25 ENCOUNTER — Telehealth: Payer: Self-pay | Admitting: *Deleted

## 2023-10-25 NOTE — Telephone Encounter (Signed)
 Will await readings to determine best how to help.

## 2023-10-25 NOTE — Telephone Encounter (Signed)
 Patient left a voicemail stating that his blood sugars are running above 200 , this has happened since his medications were changed recently. I tried calling the patient after I tried to download his CGM, which I could not. The phone just constantly rang. No answer , no voicemail.

## 2023-10-28 ENCOUNTER — Other Ambulatory Visit: Payer: Self-pay | Admitting: Nurse Practitioner

## 2023-10-28 ENCOUNTER — Telehealth: Payer: Self-pay

## 2023-10-28 ENCOUNTER — Telehealth: Payer: Self-pay | Admitting: *Deleted

## 2023-10-28 MED ORDER — TOUJEO MAX SOLOSTAR 300 UNIT/ML ~~LOC~~ SOPN: 25.0000 [IU] | PEN_INJECTOR | Freq: Every day | SUBCUTANEOUS | 3 refills | Status: DC

## 2023-10-28 NOTE — Telephone Encounter (Signed)
 Done I sent it in for the correct sig.

## 2023-10-28 NOTE — Telephone Encounter (Signed)
 Left a message requesting pt return call to the office.

## 2023-10-28 NOTE — Telephone Encounter (Signed)
 The pharmacist from [facility] left a message. The patient told them that he was now injecting 25 units. The last prescription that the pharmacy has is for 20 units. They are requesting a new prescription fror the 25 units.

## 2023-10-28 NOTE — Telephone Encounter (Signed)
 Noted.

## 2023-10-29 ENCOUNTER — Telehealth: Payer: Self-pay | Admitting: *Deleted

## 2023-10-29 NOTE — Telephone Encounter (Signed)
 Patient was called and given [name]'s recommendations after reviewing his CGM readings. She advised that the patient is slowly improving and no more changes yet. Patient is to stay on the Toujeo 25 units at night and the Ozempic 2 mg weekly.  Patient voiced understanding.

## 2023-10-30 ENCOUNTER — Telehealth: Payer: Self-pay | Admitting: *Deleted

## 2023-10-30 NOTE — Telephone Encounter (Signed)
 This has been reviewed and there is another note in the patient's chart.

## 2023-10-30 NOTE — Telephone Encounter (Signed)
 Paper work faxed to [employer].

## 2023-10-31 DIAGNOSIS — R600 Localized edema: Secondary | ICD-10-CM | POA: Diagnosis not present

## 2023-10-31 DIAGNOSIS — E1159 Type 2 diabetes mellitus with other circulatory complications: Secondary | ICD-10-CM | POA: Diagnosis not present

## 2023-10-31 DIAGNOSIS — I10 Essential (primary) hypertension: Secondary | ICD-10-CM | POA: Diagnosis not present

## 2023-10-31 DIAGNOSIS — E039 Hypothyroidism, unspecified: Secondary | ICD-10-CM | POA: Diagnosis not present

## 2023-10-31 DIAGNOSIS — E1122 Type 2 diabetes mellitus with diabetic chronic kidney disease: Secondary | ICD-10-CM | POA: Diagnosis not present

## 2023-10-31 DIAGNOSIS — N189 Chronic kidney disease, unspecified: Secondary | ICD-10-CM | POA: Diagnosis not present

## 2023-10-31 DIAGNOSIS — N1832 Chronic kidney disease, stage 3b: Secondary | ICD-10-CM | POA: Diagnosis not present

## 2023-11-01 LAB — T4, FREE: Free T4: 1.42 ng/dL (ref 0.82–1.77)

## 2023-11-01 LAB — LIPID PANEL
Chol/HDL Ratio: 3 ratio (ref 0.0–5.0)
Cholesterol, Total: 112 mg/dL (ref 100–199)
HDL: 37 mg/dL — ABNORMAL LOW (ref >39)
LDL Chol Calc (NIH): 47 mg/dL (ref 0–99)
Triglycerides: 170 mg/dL — ABNORMAL HIGH (ref 0–149)
VLDL Cholesterol Cal: 28 mg/dL (ref 5–40)

## 2023-11-01 LAB — TSH: TSH: 0.781 u[IU]/mL (ref 0.450–4.500)

## 2023-12-02 ENCOUNTER — Ambulatory Visit (INDEPENDENT_AMBULATORY_CARE_PROVIDER_SITE_OTHER): Payer: 59 | Admitting: Nurse Practitioner

## 2023-12-02 ENCOUNTER — Encounter: Payer: Self-pay | Admitting: Nurse Practitioner

## 2023-12-02 VITALS — BP 128/78 | HR 106 | Ht 69.0 in | Wt 258.0 lb

## 2023-12-02 DIAGNOSIS — E782 Mixed hyperlipidemia: Secondary | ICD-10-CM | POA: Diagnosis not present

## 2023-12-02 DIAGNOSIS — Z794 Long term (current) use of insulin: Secondary | ICD-10-CM | POA: Diagnosis not present

## 2023-12-02 DIAGNOSIS — E039 Hypothyroidism, unspecified: Secondary | ICD-10-CM

## 2023-12-02 DIAGNOSIS — Z7985 Long-term (current) use of injectable non-insulin antidiabetic drugs: Secondary | ICD-10-CM

## 2023-12-02 DIAGNOSIS — I10 Essential (primary) hypertension: Secondary | ICD-10-CM | POA: Diagnosis not present

## 2023-12-02 DIAGNOSIS — E559 Vitamin D deficiency, unspecified: Secondary | ICD-10-CM | POA: Diagnosis not present

## 2023-12-02 DIAGNOSIS — E1159 Type 2 diabetes mellitus with other circulatory complications: Secondary | ICD-10-CM

## 2023-12-02 LAB — POCT GLYCOSYLATED HEMOGLOBIN (HGB A1C): Hemoglobin A1C: 8.4 % — AB (ref 4.0–5.6)

## 2023-12-02 MED ORDER — SURE COMFORT PEN NEEDLES 31G X 8 MM MISC: 2 refills | Status: AC

## 2023-12-02 MED ORDER — LEVOTHYROXINE SODIUM 175 MCG PO TABS: 175.0000 ug | ORAL_TABLET | Freq: Every day | ORAL | 1 refills | Status: DC

## 2023-12-02 MED ORDER — SEMAGLUTIDE (2 MG/DOSE) 8 MG/3ML ~~LOC~~ SOPN: 2.0000 mg | PEN_INJECTOR | SUBCUTANEOUS | 3 refills | Status: DC

## 2023-12-02 MED ORDER — TOUJEO MAX SOLOSTAR 300 UNIT/ML ~~LOC~~ SOPN: 30.0000 [IU] | PEN_INJECTOR | Freq: Every day | SUBCUTANEOUS | 3 refills | Status: DC

## 2023-12-02 NOTE — Progress Notes (Signed)
 12/02/2023   Endocrinology follow-up note   Subjective:    Patient ID: [name], male    DOB: 1955-10-08,    Past Medical History:  Diagnosis Date   ASCVD (arteriosclerotic cardiovascular disease)    -MI in 01/2001 prompted CABG; EF-30% at cath; 50% on echo in 7/02 and normal in 2005   Asthma    Cerebrovascular disease     L CEA 06/2004 following left renal embolism; 10/2008 plaque w/o focal stenosis   Chronic back pain    Chronic leg pain    Chronic obstructive pulmonary disease (HCC)    Degenerative joint disease     chronic LBP-s/p L3-4 fusion   Diabetes mellitus    -no insulin    Hyperlipidemia    Hypertension    Hypothyroidism    Obesity    Restless leg syndrome    Possible   Tobacco abuse, in remission    50 pack years; discontinued in 2002   Past Surgical History:  Procedure Laterality Date   CAROTID ENDARTERECTOMY  08/14/2003   Left   CATARACT EXTRACTION     bilateral   CHOLECYSTECTOMY N/A 02/06/2021   Procedure: LAPAROSCOPIC CHOLECYSTECTOMY;  Surgeon: Alanda Allegra, MD;  Location: [facility];  Service: General;  Laterality: N/A;   CORONARY ARTERY BYPASS GRAFT  08/13/2000   GALLBLADDER SURGERY  02/07/2021   LUMBAR SPINE SURGERY     L3-4 fusion   Social History   Socioeconomic History   Marital status: Divorced    Spouse name: Not on file   Number of children: 1   Years of education: Not on file   Highest education level: Not on file  Occupational History   Occupation: veteran    Comment: disabledf  Tobacco Use   Smoking status: Former    Current packs/day: 0.00    Types: Cigarettes    Quit date: 01/15/2001    Years since quitting: 22.8   Smokeless tobacco: Never  Vaping Use   Vaping status: Never Used  Substance and Sexual Activity   Alcohol use: No   Drug use: No   Sexual activity: Not Currently  Other Topics Concern   Not on file  Social History Narrative   Not on file   Social Drivers of Health   Financial Resource Strain:  Low Risk  (04/10/2022)   Overall Financial Resource Strain (CARDIA)    Difficulty of Paying Living Expenses: Not hard at all  Food Insecurity: Not on file  Transportation Needs: No Transportation Needs (04/10/2022)   PRAPARE - Transportation    Lack of Transportation (Medical): No    Lack of Transportation (Non-Medical): No  Physical Activity: Not on file  Stress: Not on file  Social Connections: Unknown (12/26/2021)   Received from [facility]   Social Network    Social Network: Not on file   Outpatient Encounter Medications as of 12/02/2023  Medication Sig   Acetaminophen  (TYLENOL  PO) Take by mouth. As needed   albuterol  (VENTOLIN  HFA) 108 (90 Base) MCG/ACT inhaler Inhale 1-2 puffs into the lungs every 6 (six) hours as needed for shortness of breath or wheezing.   albuterol -ipratropium (COMBIVENT) 18-103 MCG/ACT inhaler Inhale 2 puffs into the lungs every 6 (six) hours as needed for wheezing or shortness of breath.    amLODipine (NORVASC) 5 MG tablet Take 5 mg by mouth daily.   Ascorbic Acid (VITAMIN C) 1000 MG tablet Take 1,000 mg by mouth daily.   aspirin 81 MG tablet Take 81 mg  by mouth daily.     B Complex-Biotin-FA (VITAMIN B50 COMPLEX PO) Take 1 capsule by mouth daily.   Cholecalciferol (VITAMIN D  PO) Take 1 capsule by mouth daily.   Continuous Blood Gluc Receiver (FREESTYLE LIBRE 2 READER) DEVI Use to monitor glucose 4 times daily   Continuous Glucose Sensor (FREESTYLE LIBRE 2 SENSOR) MISC USE AS DIRECTED TO MONITOR BLOOD SUGAR (4) TIMES DAILY.   diazepam (VALIUM) 10 MG tablet Take 5 mg by mouth at bedtime as needed for anxiety.   docusate sodium (COLACE) 100 MG capsule Take 200 mg by mouth daily.   fenofibrate 160 MG tablet Take 160 mg by mouth daily.   fish oil-omega-3 fatty acids 1000 MG capsule Take 2 g by mouth daily.   gabapentin (NEURONTIN) 300 MG capsule Take 300 mg by mouth at bedtime.   glucose blood (ACCU-CHEK AVIVA PLUS) test strip Use as directed to  check blood glucose four times daily   losartan  (COZAAR ) 50 MG tablet TAKE ONE TABLET BY MOUTH ONCE DAILY.   Multiple Vitamins-Minerals (MULTIVITAMIN WITH MINERALS) tablet Take 1 tablet by mouth daily.     nitroGLYCERIN (NITROSTAT) 0.4 MG SL tablet Place 0.4 mg under the tongue every 5 (five) minutes as needed for chest pain.   OVER THE COUNTER MEDICATION Vitamin B 50 - patient reports that he takes daily.   rosuvastatin  (CRESTOR ) 40 MG tablet Take 1 tablet (40 mg total) by mouth daily.   [DISCONTINUED] insulin  glargine, 2 Unit Dial , (TOUJEO  MAX SOLOSTAR) 300 UNIT/ML Solostar Pen Inject 25 Units into the skin at bedtime.   [DISCONTINUED] Insulin  Pen Needle (SURE COMFORT PEN NEEDLES) 31G X 8 MM MISC Use to inject insulin  once daily   [DISCONTINUED] levothyroxine  (SYNTHROID ) 175 MCG tablet Take 1 tablet (175 mcg total) by mouth daily before breakfast.   [DISCONTINUED] Semaglutide , 2 MG/DOSE, 8 MG/3ML SOPN Inject 2 mg as directed once a week.   HYDROmorphone  (DILAUDID ) 4 MG tablet Take 4 mg by mouth 3 (three) times daily. (Patient not taking: Reported on 12/02/2023)   insulin  glargine, 2 Unit Dial , (TOUJEO  MAX SOLOSTAR) 300 UNIT/ML Solostar Pen Inject 30 Units into the skin at bedtime.   Insulin  Pen Needle (SURE COMFORT PEN NEEDLES) 31G X 8 MM MISC Use to inject insulin  once daily   levothyroxine  (SYNTHROID ) 175 MCG tablet Take 1 tablet (175 mcg total) by mouth daily before breakfast.   Semaglutide , 2 MG/DOSE, 8 MG/3ML SOPN Inject 2 mg as directed once a week.   No facility-administered encounter medications on file as of 12/02/2023.   ALLERGIES: Allergies  Allergen Reactions   Alprazolam Nausea And Vomiting and Other (See Comments)    Altered mental status   Atorvastatin Nausea And Vomiting   Gemfibrozil     unknown   Invokana [Canagliflozin] Diarrhea and Nausea And Vomiting   Definity  [Perflutren  Lipid Microsphere] Other (See Comments)    Headache   VACCINATION STATUS: Immunization  History  Administered Date(s) Administered   Influenza-Unspecified 05/13/2014    Diabetes He presents for his follow-up diabetic visit. He has type 2 diabetes mellitus. Onset time: He was diagnosed at approximate age of 50 years. His disease course has been fluctuating. There are no hypoglycemic associated symptoms. Pertinent negatives for hypoglycemia include no confusion, headaches or pallor. Associated symptoms include foot paresthesias. Pertinent negatives for diabetes include no fatigue, no polydipsia, no polyphagia, no polyuria and no weight loss. Hypoglycemia complications include nocturnal hypoglycemia. Symptoms are stable. Diabetic complications include a CVA, heart disease, nephropathy and peripheral  neuropathy. Risk factors for coronary artery disease include diabetes mellitus, dyslipidemia, hypertension, male sex, tobacco exposure, sedentary lifestyle and obesity. Current diabetic treatment includes insulin  injections (and Ozempic ). He is compliant with treatment most of the time. His weight is increasing rapidly. He is following a generally healthy diet. When asked about meal planning, he reported none. He has had a previous visit with a dietitian. He participates in exercise intermittently. His home blood glucose trend is decreasing steadily. His overall blood glucose range is 140-180 mg/dl. (He presents today with his CGM showing slightly above target glycemic profile.  His POCT A1c today is 8.4%, increasing from last visit of 7%.  Analysis of his CGM shows TIR 57%, TAR 43%, TBR 0% with a GMI of 7.5%.  He notes he has still been dealing with his brothers estate and has been more stressed.  He does note he has been exercising a bit more with walking his dog more. ) An ACE inhibitor/angiotensin II receptor blocker is being taken. He does not see a podiatrist.Eye exam is current.  Hyperlipidemia This is a chronic problem. The current episode started more than 1 year ago. The problem is  uncontrolled. Recent lipid tests were reviewed and are variable. Exacerbating diseases include diabetes, hypothyroidism and obesity. He has no history of chronic renal disease. Factors aggravating his hyperlipidemia include fatty foods. Associated symptoms include myalgias. Current antihyperlipidemic treatment includes statins. The current treatment provides moderate improvement of lipids. Compliance problems include adherence to diet and adherence to exercise.  Risk factors for coronary artery disease include family history, dyslipidemia, diabetes mellitus, hypertension, male sex, a sedentary lifestyle and obesity.  Thyroid  Problem Presents for follow-up (He has longstanding hypothyroidism, currently on levothyroxine  150 mcg p.o. daily before breakfast.  He reports compliance with medication.) visit. Patient reports no cold intolerance, constipation, depressed mood, diarrhea, fatigue, heat intolerance, palpitations, weight gain or weight loss. The symptoms have been stable. His past medical history is significant for diabetes.     Review of systems  Constitutional: + increasing body weight,  current Body mass index is 38.1 kg/m. , + fatigue, no subjective hyperthermia, no subjective hypothermia Eyes: no blurry vision, no xerophthalmia ENT: no sore throat, no nodules palpated in throat, no dysphagia/odynophagia, no hoarseness Cardiovascular: no chest pain, no shortness of breath, no palpitations, no leg swelling Respiratory: no cough, no shortness of breath Gastrointestinal: no nausea/vomiting/diarrhea Musculoskeletal: diffuse muscle/joint aches Skin: no rashes, no hyperemia Neurological: no tremors, no numbness, no tingling, no dizziness Psychiatric: no depression, no anxiety, increased stress   Objective:    BP 128/78 (BP Location: Left Arm, Patient Position: Sitting, Cuff Size: Large)   Pulse (!) 106   Ht 5\' 9"  (1.753 m)   Wt 258 lb (117 kg)   BMI 38.10 kg/m   Wt Readings from Last 3  Encounters:  12/02/23 258 lb (117 kg)  09/02/23 256 lb 12.8 oz (116.5 kg)  07/15/23 236 lb (107 kg)   BP Readings from Last 3 Encounters:  12/02/23 128/78  09/02/23 116/76  07/15/23 130/78     Physical Exam- Limited  Constitutional:  Body mass index is 38.1 kg/m. , not in acute distress, normal state of mind Eyes:  EOMI, no exophthalmos Musculoskeletal: no gross deformities, strength intact in all four extremities, no gross restriction of joint movements Skin:  no rashes, no hyperemia Neurological: no tremor with outstretched hands   Diabetic Foot Exam - Simple   No data filed     Results for orders placed  or performed in visit on 12/02/23  HgB A1c   Collection Time: 12/02/23 11:27 AM  Result Value Ref Range   Hemoglobin A1C 8.4 (A) 4.0 - 5.6 %   HbA1c POC (<> result, manual entry)     HbA1c, POC (prediabetic range)     HbA1c, POC (controlled diabetic range)     Lab Results  Component Value Date   WBC 11.9 (H) 01/31/2021   HGB 14.1 01/31/2021   HCT 43.5 01/31/2021   MCV 91.6 01/31/2021   PLT 227 01/31/2021    Lab Results  Component Value Date   HGBA1C 8.4 (A) 12/02/2023   HGBA1C 7.0 (A) 09/02/2023   HGBA1C 6.4 (A) 04/30/2023   MICROALBUR 80 01/22/2022   MICROALBUR 77.4 02/26/2020     Lipid Panel     Component Value Date/Time   CHOL 112 10/31/2023 1046   TRIG 170 (H) 10/31/2023 1046   TRIG 329 10/02/2007 0000   HDL 37 (L) 10/31/2023 1046   CHOLHDL 3.0 10/31/2023 1046   CHOLHDL 4.9 02/26/2020 1002   VLDL 19 09/09/2018 0949   LDLCALC 47 10/31/2023 1046   LDLCALC 136 (H) 02/26/2020 1002   LDLCALC 66 10/02/2007 0000     Assessment & Plan:   1) Type 2 diabetes mellitus with stage 3 chronic kidney disease, with long-term current use of insulin    He presents today with his CGM showing slightly above target glycemic profile.  His POCT A1c today is 8.4%, increasing from last visit of 7%.  Analysis of his CGM shows TIR 57%, TAR 43%, TBR 0% with a GMI of  7.5%.  He notes he has still been dealing with his brothers estate and has been more stressed.  He does note he has been exercising a bit more with walking his dog more.    Recent labs reviewed.  - His diabetes is complicated by CAD, CKD, CVA, Retinopathy. Patient remains at a high risk for more acute and chronic complications of diabetes which include CAD, CVA, CKD, retinopathy, and neuropathy. These are all discussed in detail with the patient.  - Nutritional counseling repeated at each appointment due to patients tendency to fall back in to old habits.  - The patient admits there is a room for improvement in their diet and drink choices. -  Suggestion is made for the patient to avoid simple carbohydrates from their diet including Cakes, Sweet Desserts / Pastries, Ice Cream, Soda (diet and regular), Sweet Tea, Candies, Chips, Cookies, Sweet Pastries, Store Bought Juices, Alcohol in Excess of 1-2 drinks a day, Artificial Sweeteners, Coffee Creamer, and "Sugar-free" Products. This will help patient to have stable blood glucose profile and potentially avoid unintended weight gain.   - I encouraged the patient to switch to unprocessed or minimally processed complex starch and increased protein intake (animal or plant source), fruits, and vegetables.   - Patient is advised to stick to a routine mealtimes to eat 3 meals a day and avoid unnecessary snacks (to snack only to correct hypoglycemia).  -He is advised to increase his Toujeo  to 30 units SQ nightly and continue Ozempic  2 mg SQ weekly.  -He is encouraged to continue using his CGM to monitor glucose 4 times daily, before meals and before bed, and to call the clinic if he has readings less than 70 or greater than 200 for 3 tests in a row.  -Due to CKD patient is not a candidate for Metformin , SGLT2i.  -Target numbers for A1c, LDL, HDL, Triglycerides, Waist  Circumference were discussed in detail.  2) Lipids/HPL:  His recent lipid panel from  10/31/23 shows controlled LDL of 47 and elevated triglycerides of 170 (improving).  He is advised to continue Crestor  5 mg p.o. daily at bedtime, Omega-3 fatty acid supplement, and Fenofibrate 160 mg p.o. daily.  Side effects and precautions discussed with him.    3) Hypothyroidism:   -His previsit TFTs are consistent with appropriate hormone replacement. He is advised to continue his Levothyroxine  175 mcg po daily before breakfast.     - We discussed about the correct intake of his thyroid  hormone, on empty stomach at fasting, with water, separated by at least 30 minutes from breakfast and other medications,  and separated by more than 4 hours from calcium , iron, multivitamins, acid reflux medications (PPIs). -Patient is made aware of the fact that thyroid  hormone replacement is needed for life, dose to be adjusted by periodic monitoring of thyroid  function tests.  4) Hypertension/BP- His blood pressure is controlled to target.  He is advised to continue Losartan  50 mg po daily.  5) Vitamin D  Deficiency His most recent vitamin D  level was 22.2 on 10/02/21.  He takes an OTC supplement.  He could not remember to take his Ergocalciferol  weekly, therefore he stopped it.  6) Hypercalcemia- resolved His calcium  level was 11.5 on 10/02/21, worsening from previous reading.  This is the second occurrence of high calcium  levels.  He denies any personal or family history of thyroid  cancer, adrenal problems, parathyroid problems, kidney stones, or fragility fractures.  His repeat calcium  levels returned to normal of 10. Phosphorous and Magnesium was also WNL.  PTH was normal at 21, PTH-rp negative.  His urine studies were also unremarkable, ruling out F[facility].  Given his labs have returned to normal, no need for further investigation at this time.       I advised patient to maintain close follow up with his PCP for primary care needs.      I spent  31  minutes in the care of the patient today including  review of labs from CMP, Lipids, Thyroid  Function, Hematology (current and previous including abstractions from other facilities); face-to-face time discussing  his blood glucose readings/logs, discussing hypoglycemia and hyperglycemia episodes and symptoms, medications doses, his options of short and long term treatment based on the latest standards of care / guidelines;  discussion about incorporating lifestyle medicine;  and documenting the encounter. Risk reduction counseling performed per USPSTF guidelines to reduce obesity and cardiovascular risk factors.     Please refer to Patient Instructions for Blood Glucose Monitoring and Insulin /Medications Dosing Guide"  in media tab for additional information. Please  also refer to " Patient Self Inventory" in the Media  tab for reviewed elements of pertinent patient history.  [name] participated in the discussions, expressed understanding, and voiced agreement with the above plans.  All questions were answered to his satisfaction. he is encouraged to contact clinic should he have any questions or concerns prior to his return visit.    Follow up plan: Return in about 4 months (around 04/02/2024) for Diabetes F/U with A1c in office, No previsit labs, Bring meter and logs.   Hulon Magic, [facility] [address] [place], [place] [address] Phone: [phone] Fax: [phone]   12/02/2023, 11:55 AM

## 2024-01-07 DIAGNOSIS — G894 Chronic pain syndrome: Secondary | ICD-10-CM | POA: Diagnosis not present

## 2024-01-07 DIAGNOSIS — G720 Drug-induced myopathy: Secondary | ICD-10-CM | POA: Diagnosis not present

## 2024-01-07 DIAGNOSIS — E1165 Type 2 diabetes mellitus with hyperglycemia: Secondary | ICD-10-CM | POA: Diagnosis not present

## 2024-01-07 DIAGNOSIS — J9801 Acute bronchospasm: Secondary | ICD-10-CM | POA: Diagnosis not present

## 2024-01-20 ENCOUNTER — Encounter: Payer: Self-pay | Admitting: Cardiology

## 2024-01-20 ENCOUNTER — Ambulatory Visit: Payer: 59 | Attending: Cardiology | Admitting: Cardiology

## 2024-01-20 VITALS — BP 130/72 | HR 90 | Ht 69.0 in | Wt 260.0 lb

## 2024-01-20 DIAGNOSIS — I6523 Occlusion and stenosis of bilateral carotid arteries: Secondary | ICD-10-CM

## 2024-01-20 DIAGNOSIS — I2510 Atherosclerotic heart disease of native coronary artery without angina pectoris: Secondary | ICD-10-CM

## 2024-01-20 DIAGNOSIS — I10 Essential (primary) hypertension: Secondary | ICD-10-CM

## 2024-01-20 DIAGNOSIS — E782 Mixed hyperlipidemia: Secondary | ICD-10-CM

## 2024-01-20 NOTE — Progress Notes (Signed)
 Clinical Summary [name] is a 68 y.o.male seen today for follow up of the following medical problems   1.CAD - prior CABG in 2002, initially presented with MI 06/2020 echo: LVEF 55%, indet diastolic, apical anteroseptal akinesis,  - 09/2020 nuclear stress anterior scar, no ischemia    - no chest pains, no SOB/DOE - compliant with meds   2. Carotid stenosis - prior left CEA - 07/2022 carotid US : RICA 1-39%, LICA 1-39%. - 07/2023 RICA 1-39%, LICA 1-39% - repeat US  set for next week  - change interval to every 2 years.    3. HTN - he is compliant with meds     4. HLD 03/2022: TC 93 TG 119 HDL 33 LDL 38 - 10/2023 TC 161 TG 096 HDL 37 LDL 47   5. CKD - followed by nephrology     6. COPD   7. DM2 - followed by endocrine.  - 11/2023 A1c 8.4     SH: Friend is [name], also patient of mine.  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  Past Medical History:  Diagnosis Date   ASCVD (arteriosclerotic cardiovascular disease)    -MI in 01/2001 prompted CABG; EF-30% at cath; 50% on echo in 7/02 and normal in 2005   Asthma    Cerebrovascular disease     L CEA 06/2004 following left renal embolism; 10/2008 plaque w/o focal stenosis   Chronic back pain    Chronic leg pain    Chronic obstructive pulmonary disease (HCC)    Degenerative joint disease     chronic LBP-s/p L3-4 fusion   Diabetes mellitus    -no insulin    Hyperlipidemia    Hypertension    Hypothyroidism    Obesity    Restless leg syndrome    Possible   Tobacco abuse, in remission    50 pack years; discontinued in 2002     Allergies  Allergen Reactions   Alprazolam Nausea And Vomiting and Other (See Comments)    Altered mental status   Atorvastatin Nausea And Vomiting   Gemfibrozil     unknown   Invokana [Canagliflozin] Diarrhea and Nausea And Vomiting   Definity  [Perflutren  Lipid Microsphere] Other (See Comments)    Headache     Current Outpatient Medications  Medication Sig  Dispense Refill   Acetaminophen  (TYLENOL  PO) Take by mouth. As needed     albuterol  (VENTOLIN  HFA) 108 (90 Base) MCG/ACT inhaler Inhale 1-2 puffs into the lungs every 6 (six) hours as needed for shortness of breath or wheezing.     albuterol -ipratropium (COMBIVENT) 18-103 MCG/ACT inhaler Inhale 2 puffs into the lungs every 6 (six) hours as needed for wheezing or shortness of breath.      amLODipine (NORVASC) 5 MG tablet Take 5 mg by mouth daily.     Ascorbic Acid (VITAMIN C) 1000 MG tablet Take 1,000 mg by mouth daily.     aspirin 81 MG tablet Take 81 mg by mouth daily.       B Complex-Biotin-FA (VITAMIN B50 COMPLEX PO) Take 1 capsule by mouth daily.     Cholecalciferol (VITAMIN D  PO) Take 1 capsule by mouth daily.     Continuous Blood Gluc Receiver (FREESTYLE LIBRE 2 READER) DEVI Use to monitor glucose 4 times daily 1 each 0   Continuous Glucose Sensor (FREESTYLE LIBRE 2 SENSOR) MISC USE AS DIRECTED TO MONITOR BLOOD SUGAR (4) TIMES DAILY. 2 each 6   diazepam (VALIUM) 10 MG tablet Take  5 mg by mouth at bedtime as needed for anxiety.     docusate sodium (COLACE) 100 MG capsule Take 200 mg by mouth daily.     fenofibrate 160 MG tablet Take 160 mg by mouth daily.     fish oil-omega-3 fatty acids 1000 MG capsule Take 2 g by mouth daily.     gabapentin (NEURONTIN) 300 MG capsule Take 300 mg by mouth at bedtime.     glucose blood (ACCU-CHEK AVIVA PLUS) test strip Use as directed to check blood glucose four times daily 400 strip 1   HYDROmorphone  (DILAUDID ) 4 MG tablet Take 4 mg by mouth 3 (three) times daily. (Patient not taking: Reported on 12/02/2023)     insulin  glargine, 2 Unit Dial , (TOUJEO  MAX SOLOSTAR) 300 UNIT/ML Solostar Pen Inject 30 Units into the skin at bedtime. 9 mL 3   Insulin  Pen Needle (SURE COMFORT PEN NEEDLES) 31G X 8 MM MISC Use to inject insulin  once daily 100 each 2   levothyroxine  (SYNTHROID ) 175 MCG tablet Take 1 tablet (175 mcg total) by mouth daily before breakfast. 90 tablet 1    losartan  (COZAAR ) 50 MG tablet TAKE ONE TABLET BY MOUTH ONCE DAILY. 90 tablet 3   Multiple Vitamins-Minerals (MULTIVITAMIN WITH MINERALS) tablet Take 1 tablet by mouth daily.       nitroGLYCERIN (NITROSTAT) 0.4 MG SL tablet Place 0.4 mg under the tongue every 5 (five) minutes as needed for chest pain.     OVER THE COUNTER MEDICATION Vitamin B 50 - patient reports that he takes daily.     rosuvastatin  (CRESTOR ) 40 MG tablet Take 1 tablet (40 mg total) by mouth daily. 90 tablet 3   Semaglutide , 2 MG/DOSE, 8 MG/3ML SOPN Inject 2 mg as directed once a week. 9 mL 3   No current facility-administered medications for this visit.     Past Surgical History:  Procedure Laterality Date   CAROTID ENDARTERECTOMY  08/14/2003   Left   CATARACT EXTRACTION     bilateral   CHOLECYSTECTOMY N/A 02/06/2021   Procedure: LAPAROSCOPIC CHOLECYSTECTOMY;  Surgeon: Alanda Allegra, MD;  Location: [facility];  Service: General;  Laterality: N/A;   CORONARY ARTERY BYPASS GRAFT  08/13/2000   GALLBLADDER SURGERY  02/07/2021   LUMBAR SPINE SURGERY     L3-4 fusion     Allergies  Allergen Reactions   Alprazolam Nausea And Vomiting and Other (See Comments)    Altered mental status   Atorvastatin Nausea And Vomiting   Gemfibrozil     unknown   Invokana [Canagliflozin] Diarrhea and Nausea And Vomiting   Definity  [Perflutren  Lipid Microsphere] Other (See Comments)    Headache      No family history on file.   Social History [name] reports that he quit smoking about 23 years ago. His smoking use included cigarettes. He has never used smokeless tobacco. [name] reports no history of alcohol use.   Physical Examination Today's Vitals   01/20/24 1124  BP: 130/72  Pulse: 90  SpO2: 92%  Weight: 260 lb (117.9 kg)  Height: 5\' 9"  (1.753 m)  PainSc: 0-No pain   Body mass index is 38.4 kg/m.  Gen: resting comfortably, no acute distress HEENT: no scleral icterus, pupils equal round and reactive, no  palptable cervical adenopathy,  CV: RRR, no m/rg, no jvd Resp: Clear to auscultation bilaterally GI: abdomen is soft, non-tender, non-distended, normal bowel sounds, no hepatosplenomegaly MSK: extremities are warm, no edema.  Skin: warm, no rash Neuro:  no  focal deficits Psych: appropriate affect     Assessment and Plan   1.CAD - no symptms, continue current meds   2. HTN -bp is at goal, continue current med   3. HLD - LDL at goal, TGs should improve as glucose control improves - continue current meds   4. Carotid stenosis - stable mild disease on last several checks, extend interval to every 2 years     Laurann Pollock, M.D.

## 2024-01-20 NOTE — Patient Instructions (Signed)
Medication Instructions:  Your physician recommends that you continue on your current medications as directed. Please refer to the Current Medication list given to you today.   Labwork: None today  Testing/Procedures: None today  Follow-Up: 6 months Dr.Branch  Any Other Special Instructions Will Be Listed Below (If Applicable).  If you need a refill on your cardiac medications before your next appointment, please call your pharmacy.

## 2024-01-21 ENCOUNTER — Other Ambulatory Visit: Payer: Self-pay | Admitting: Cardiology

## 2024-01-21 DIAGNOSIS — E7800 Pure hypercholesterolemia, unspecified: Secondary | ICD-10-CM

## 2024-01-23 DIAGNOSIS — I10 Essential (primary) hypertension: Secondary | ICD-10-CM | POA: Diagnosis not present

## 2024-01-23 DIAGNOSIS — G720 Drug-induced myopathy: Secondary | ICD-10-CM | POA: Diagnosis not present

## 2024-01-23 DIAGNOSIS — M5134 Other intervertebral disc degeneration, thoracic region: Secondary | ICD-10-CM | POA: Diagnosis not present

## 2024-01-23 DIAGNOSIS — N1831 Chronic kidney disease, stage 3a: Secondary | ICD-10-CM | POA: Diagnosis not present

## 2024-01-23 DIAGNOSIS — E1165 Type 2 diabetes mellitus with hyperglycemia: Secondary | ICD-10-CM | POA: Diagnosis not present

## 2024-01-23 DIAGNOSIS — J449 Chronic obstructive pulmonary disease, unspecified: Secondary | ICD-10-CM | POA: Diagnosis not present

## 2024-01-30 ENCOUNTER — Other Ambulatory Visit: Payer: Self-pay | Admitting: *Deleted

## 2024-02-03 ENCOUNTER — Telehealth: Payer: Self-pay | Admitting: Nurse Practitioner

## 2024-02-03 ENCOUNTER — Telehealth: Payer: Self-pay

## 2024-02-03 ENCOUNTER — Other Ambulatory Visit (HOSPITAL_COMMUNITY): Payer: Self-pay

## 2024-02-03 MED ORDER — ACCU-CHEK AVIVA PLUS VI STRP: ORAL_STRIP | 12 refills | Status: DC

## 2024-02-03 NOTE — Addendum Note (Signed)
 Addended by: Rosemary Mossbarger J on: 02/03/2024 03:49 PM   Modules accepted: Orders

## 2024-02-03 NOTE — Telephone Encounter (Signed)
 Addressed in new encounter.

## 2024-02-03 NOTE — Telephone Encounter (Signed)
 Pt is needing an RX for test strips to [facility]

## 2024-02-03 NOTE — Telephone Encounter (Signed)
 Pt states he is needing a PA for test strips?  Can you check on this

## 2024-02-03 NOTE — Telephone Encounter (Signed)
 Pharmacy Patient Advocate Encounter   Received notification from Pt Calls Messages that prior authorization for Accu-Chek Aviva Plus test strips  is required/requested.   Insurance verification completed.   The patient is insured through [facility] .  Looks like the script on file was last written in 2021, so it is expired, and a new one will need sent.    Per test claim: The current 30 day co-pay is, $0.  No PA needed at this time. This test claim was processed through [facility]- copay amounts may vary at other pharmacies due to pharmacy/plan contracts, or as the patient moves through the different stages of their insurance plan.     It also says this is a Part B claim. Medication is not eligible for pharmacy benefits and must be billed through medical insurance. As our team only handles pharmacy related prior auths, medical PA's must be submitted by the clinic. Thank you

## 2024-02-03 NOTE — Telephone Encounter (Signed)
 Rx refill for test strips sent in earlier today by Whitney Reardon,FNP.

## 2024-02-03 NOTE — Telephone Encounter (Signed)
 I sent in a new script to his pharmacy for strips.  Looks like if they require a PA moving forward we have to do it here as it is medical device related? At least that's how I read it.

## 2024-02-07 NOTE — Telephone Encounter (Signed)
 Patient was called and a message was left with this information for him.

## 2024-03-26 DIAGNOSIS — N189 Chronic kidney disease, unspecified: Secondary | ICD-10-CM | POA: Diagnosis not present

## 2024-03-26 DIAGNOSIS — R600 Localized edema: Secondary | ICD-10-CM | POA: Diagnosis not present

## 2024-03-26 DIAGNOSIS — R809 Proteinuria, unspecified: Secondary | ICD-10-CM | POA: Diagnosis not present

## 2024-03-26 DIAGNOSIS — I10 Essential (primary) hypertension: Secondary | ICD-10-CM | POA: Diagnosis not present

## 2024-03-26 DIAGNOSIS — E1122 Type 2 diabetes mellitus with diabetic chronic kidney disease: Secondary | ICD-10-CM | POA: Diagnosis not present

## 2024-03-26 DIAGNOSIS — N1832 Chronic kidney disease, stage 3b: Secondary | ICD-10-CM | POA: Diagnosis not present

## 2024-03-26 DIAGNOSIS — E1129 Type 2 diabetes mellitus with other diabetic kidney complication: Secondary | ICD-10-CM | POA: Diagnosis not present

## 2024-04-06 ENCOUNTER — Ambulatory Visit: Admitting: Nurse Practitioner

## 2024-04-15 ENCOUNTER — Encounter: Payer: Self-pay | Admitting: Nurse Practitioner

## 2024-04-15 ENCOUNTER — Ambulatory Visit (INDEPENDENT_AMBULATORY_CARE_PROVIDER_SITE_OTHER): Admitting: Nurse Practitioner

## 2024-04-15 VITALS — BP 130/80 | HR 92 | Ht 69.0 in | Wt 256.6 lb

## 2024-04-15 DIAGNOSIS — E782 Mixed hyperlipidemia: Secondary | ICD-10-CM | POA: Diagnosis not present

## 2024-04-15 DIAGNOSIS — E1159 Type 2 diabetes mellitus with other circulatory complications: Secondary | ICD-10-CM | POA: Diagnosis not present

## 2024-04-15 DIAGNOSIS — I10 Essential (primary) hypertension: Secondary | ICD-10-CM

## 2024-04-15 DIAGNOSIS — E039 Hypothyroidism, unspecified: Secondary | ICD-10-CM | POA: Diagnosis not present

## 2024-04-15 DIAGNOSIS — E559 Vitamin D deficiency, unspecified: Secondary | ICD-10-CM

## 2024-04-15 DIAGNOSIS — Z7985 Long-term (current) use of injectable non-insulin antidiabetic drugs: Secondary | ICD-10-CM | POA: Diagnosis not present

## 2024-04-15 DIAGNOSIS — Z794 Long term (current) use of insulin: Secondary | ICD-10-CM | POA: Diagnosis not present

## 2024-04-15 LAB — POCT GLYCOSYLATED HEMOGLOBIN (HGB A1C): Hemoglobin A1C: 10.2 % — AB (ref 4.0–5.6)

## 2024-04-15 MED ORDER — FREESTYLE LITE TEST VI STRP: ORAL_STRIP | 12 refills | Status: AC

## 2024-04-15 MED ORDER — TIRZEPATIDE 7.5 MG/0.5ML ~~LOC~~ SOAJ: 7.5000 mg | SUBCUTANEOUS | 1 refills | Status: DC

## 2024-04-15 MED ORDER — TOUJEO MAX SOLOSTAR 300 UNIT/ML ~~LOC~~ SOPN: 40.0000 [IU] | PEN_INJECTOR | Freq: Every day | SUBCUTANEOUS | 3 refills | Status: DC

## 2024-04-15 MED ORDER — FREESTYLE LIBRE 2 PLUS SENSOR MISC: 3 refills | Status: DC

## 2024-04-15 NOTE — Progress Notes (Signed)
 04/15/2024   Endocrinology follow-up note   Subjective:    Patient ID: [name], male    DOB: 04-21-1956,    Past Medical History:  Diagnosis Date   ASCVD (arteriosclerotic cardiovascular disease)    -MI in 01/2001 prompted CABG; EF-30% at cath; 50% on echo in 7/02 and normal in 2005   Asthma    Cerebrovascular disease     L CEA 06/2004 following left renal embolism; 10/2008 plaque w/o focal stenosis   Chronic back pain    Chronic leg pain    Chronic obstructive pulmonary disease (HCC)    Degenerative joint disease     chronic LBP-s/p L3-4 fusion   Diabetes mellitus    -no insulin    Hyperlipidemia    Hypertension    Hypothyroidism    Obesity    Restless leg syndrome    Possible   Tobacco abuse, in remission    50 pack years; discontinued in 2002   Past Surgical History:  Procedure Laterality Date   CAROTID ENDARTERECTOMY  08/14/2003   Left   CATARACT EXTRACTION     bilateral   CHOLECYSTECTOMY N/A 02/06/2021   Procedure: LAPAROSCOPIC CHOLECYSTECTOMY;  Surgeon: Mavis Anes, MD;  Location: [facility];  Service: General;  Laterality: N/A;   CORONARY ARTERY BYPASS GRAFT  08/13/2000   GALLBLADDER SURGERY  02/07/2021   LUMBAR SPINE SURGERY     L3-4 fusion   Social History   Socioeconomic History   Marital status: Divorced    Spouse name: Not on file   Number of children: 1   Years of education: Not on file   Highest education level: Not on file  Occupational History   Occupation: veteran    Comment: disabledf  Tobacco Use   Smoking status: Former    Types: Cigarettes   Smokeless tobacco: Never  Vaping Use   Vaping status: Never Used  Substance and Sexual Activity   Alcohol use: No   Drug use: No   Sexual activity: Not Currently  Other Topics Concern   Not on file  Social History Narrative   Not on file   Social Drivers of Health   Financial Resource Strain: Low Risk  (04/10/2022)   Overall Financial Resource Strain (CARDIA)    Difficulty of  Paying Living Expenses: Not hard at all  Food Insecurity: Not on file  Transportation Needs: No Transportation Needs (04/10/2022)   PRAPARE - Transportation    Lack of Transportation (Medical): No    Lack of Transportation (Non-Medical): No  Physical Activity: Not on file  Stress: Not on file  Social Connections: Unknown (12/26/2021)   [facility] from [facility]   Social Network    Social Network: Not on file   Outpatient Encounter Medications as of 04/15/2024  Medication Sig   Acetaminophen  (TYLENOL  PO) Take by mouth. As needed   albuterol  (VENTOLIN  HFA) 108 (90 Base) MCG/ACT inhaler Inhale 1-2 puffs into the lungs every 6 (six) hours as needed for shortness of breath or wheezing.   albuterol -ipratropium (COMBIVENT) 18-103 MCG/ACT inhaler Inhale 2 puffs into the lungs every 6 (six) hours as needed for wheezing or shortness of breath.    amLODipine (NORVASC) 5 MG tablet Take 5 mg by mouth daily.   aspirin 81 MG tablet Take 81 mg by mouth daily.     B Complex-Biotin-FA (VITAMIN B50 COMPLEX PO) Take 1 capsule by mouth daily.   Cholecalciferol (VITAMIN D  PO) Take 1 capsule by mouth daily.   Continuous Blood Gluc  Receiver (FREESTYLE LIBRE 2 READER) DEVI Use to monitor glucose 4 times daily   Continuous Glucose Sensor (FREESTYLE LIBRE 2 PLUS SENSOR) MISC Change sensor every 15 days.   diazepam (VALIUM) 10 MG tablet Take 5 mg by mouth at bedtime as needed for anxiety.   docusate sodium (COLACE) 100 MG capsule Take 200 mg by mouth daily.   fenofibrate 160 MG tablet Take 160 mg by mouth daily.   fish oil-omega-3 fatty acids 1000 MG capsule Take 2 g by mouth daily.   gabapentin (NEURONTIN) 300 MG capsule Take 300 mg by mouth at bedtime.   glucose blood (ACCU-CHEK AVIVA PLUS) test strip Use as instructed to monitor glucose twice daily- use as back up to CGM   Insulin  Pen Needle (SURE COMFORT PEN NEEDLES) 31G X 8 MM MISC Use to inject insulin  once daily   levothyroxine  (SYNTHROID ) 175 MCG tablet  Take 1 tablet (175 mcg total) by mouth daily before breakfast.   losartan  (COZAAR ) 50 MG tablet TAKE ONE TABLET BY MOUTH ONCE DAILY.   Multiple Vitamins-Minerals (MULTIVITAMIN WITH MINERALS) tablet Take 1 tablet by mouth daily.     nitroGLYCERIN (NITROSTAT) 0.4 MG SL tablet Place 0.4 mg under the tongue every 5 (five) minutes as needed for chest pain.   OVER THE COUNTER MEDICATION Vitamin B 50 - patient reports that he takes daily.   rosuvastatin  (CRESTOR ) 40 MG tablet Take 1 tablet (40 mg total) by mouth daily.   tirzepatide  (MOUNJARO ) 7.5 MG/0.5ML Pen Inject 7.5 mg into the skin once a week.   [DISCONTINUED] Continuous Glucose Sensor (FREESTYLE LIBRE 2 SENSOR) MISC USE AS DIRECTED TO MONITOR BLOOD SUGAR (4) TIMES DAILY.   [DISCONTINUED] insulin  glargine, 2 Unit Dial , (TOUJEO  MAX SOLOSTAR) 300 UNIT/ML Solostar Pen Inject 30 Units into the skin at bedtime.   [DISCONTINUED] Semaglutide , 2 MG/DOSE, 8 MG/3ML SOPN Inject 2 mg as directed once a week.   Ascorbic Acid (VITAMIN C) 1000 MG tablet Take 1,000 mg by mouth daily. (Patient not taking: Reported on 04/15/2024)   HYDROmorphone  (DILAUDID ) 4 MG tablet Take 4 mg by mouth 3 (three) times daily. (Patient not taking: Reported on 04/15/2024)   insulin  glargine, 2 Unit Dial , (TOUJEO  MAX SOLOSTAR) 300 UNIT/ML Solostar Pen Inject 40 Units into the skin at bedtime.   No facility-administered encounter medications on file as of 04/15/2024.   ALLERGIES: Allergies  Allergen Reactions   Alprazolam Nausea And Vomiting and Other (See Comments)    Altered mental status   Atorvastatin Nausea And Vomiting   Gemfibrozil     unknown   Hydromorphone  Other (See Comments)    numbness   Invokana [Canagliflozin] Diarrhea and Nausea And Vomiting   Definity  [Perflutren  Lipid Microsphere] Other (See Comments)    Headache   VACCINATION STATUS: Immunization History  Administered Date(s) Administered   Influenza-Unspecified 05/13/2014    Diabetes He presents for his  follow-up diabetic visit. He has type 2 diabetes mellitus. Onset time: He was diagnosed at approximate age of 50 years. His disease course has been worsening. There are no hypoglycemic associated symptoms. Pertinent negatives for hypoglycemia include no confusion, headaches or pallor. Associated symptoms include foot paresthesias. Pertinent negatives for diabetes include no fatigue, no polydipsia, no polyphagia, no polyuria and no weight loss. Hypoglycemia complications include nocturnal hypoglycemia. Symptoms are stable. Diabetic complications include a CVA, heart disease, nephropathy and peripheral neuropathy. Risk factors for coronary artery disease include diabetes mellitus, dyslipidemia, hypertension, male sex, tobacco exposure, sedentary lifestyle and obesity. Current diabetic treatment includes  insulin  injections (and Ozempic ). He is compliant with treatment most of the time. His weight is increasing rapidly. He is following a generally healthy diet. When asked about meal planning, he reported none. He has had a previous visit with a dietitian. He participates in exercise intermittently. His home blood glucose trend is increasing steadily. His overall blood glucose range is >200 mg/dl. (He presents today with his CGM showing above target glycemic profile overall.  His POCT A1c today is 10.2%, increasing from last visit of 8.4%.  Analysis of his CGM shows TIR 27%, TAR 73%, TBR 0% with a GMI of 8.7%.  He still notes he is stressed, still dealing with his brothers estate problems after he passed.  He notes he has picked up a few pounds and has not eaten as healthy recently.) An ACE inhibitor/angiotensin II receptor blocker is being taken. He does not see a podiatrist.Eye exam is current.  Hyperlipidemia This is a chronic problem. The current episode started more than 1 year ago. The problem is uncontrolled. Recent lipid tests were reviewed and are variable. Exacerbating diseases include diabetes,  hypothyroidism and obesity. He has no history of chronic renal disease. Factors aggravating his hyperlipidemia include fatty foods. Associated symptoms include myalgias. Current antihyperlipidemic treatment includes statins. The current treatment provides moderate improvement of lipids. Compliance problems include adherence to diet and adherence to exercise.  Risk factors for coronary artery disease include family history, dyslipidemia, diabetes mellitus, hypertension, male sex, a sedentary lifestyle and obesity.  Thyroid  Problem Presents for follow-up (He has longstanding hypothyroidism, currently on levothyroxine  150 mcg p.o. daily before breakfast.  He reports compliance with medication.) visit. Patient reports no cold intolerance, constipation, depressed mood, diarrhea, fatigue, heat intolerance, palpitations, weight gain or weight loss. The symptoms have been stable. His past medical history is significant for diabetes and hyperlipidemia.     Review of systems  Constitutional: + stable body weight,  current Body mass index is 37.89 kg/m. , + fatigue, no subjective hyperthermia, no subjective hypothermia Eyes: no blurry vision, no xerophthalmia ENT: no sore throat, no nodules palpated in throat, no dysphagia/odynophagia, no hoarseness Cardiovascular: no chest pain, no shortness of breath, no palpitations, no leg swelling Respiratory: no cough, no shortness of breath Gastrointestinal: no nausea/vomiting/diarrhea Musculoskeletal: diffuse muscle/joint aches Skin: no rashes, no hyperemia Neurological: no tremors, no numbness, no tingling, no dizziness Psychiatric: no depression, no anxiety, increased stress   Objective:    BP 130/80 (BP Location: Right Arm, Patient Position: Sitting, Cuff Size: Large)   Pulse 92   Ht 5' 9 (1.753 m)   Wt 256 lb 9.6 oz (116.4 kg)   BMI 37.89 kg/m   Wt Readings from Last 3 Encounters:  04/15/24 256 lb 9.6 oz (116.4 kg)  01/20/24 260 lb (117.9 kg)   12/02/23 258 lb (117 kg)   BP Readings from Last 3 Encounters:  04/15/24 130/80  01/20/24 130/72  12/02/23 128/78     Physical Exam- Limited  Constitutional:  Body mass index is 37.89 kg/m. , not in acute distress, normal state of mind Eyes:  EOMI, no exophthalmos Musculoskeletal: no gross deformities, strength intact in all four extremities, no gross restriction of joint movements Skin:  no rashes, no hyperemia Neurological: no tremor with outstretched hands   Diabetic Foot Exam - Simple   No data filed     Results for orders placed or performed in visit on 04/15/24  HgB A1c   Collection Time: 04/15/24  8:40 AM  Result Value Ref  Range   Hemoglobin A1C 10.2 (A) 4.0 - 5.6 %   HbA1c POC (<> result, manual entry)     HbA1c, POC (prediabetic range)     HbA1c, POC (controlled diabetic range)     Lab Results  Component Value Date   WBC 11.9 (H) 01/31/2021   HGB 14.1 01/31/2021   HCT 43.5 01/31/2021   MCV 91.6 01/31/2021   PLT 227 01/31/2021    Lab Results  Component Value Date   HGBA1C 10.2 (A) 04/15/2024   HGBA1C 8.4 (A) 12/02/2023   HGBA1C 7.0 (A) 09/02/2023   MICROALBUR 80 01/22/2022   MICROALBUR 77.4 02/26/2020     Lipid Panel     Component Value Date/Time   CHOL 112 10/31/2023 1046   TRIG 170 (H) 10/31/2023 1046   TRIG 329 10/02/2007 0000   HDL 37 (L) 10/31/2023 1046   CHOLHDL 3.0 10/31/2023 1046   CHOLHDL 4.9 02/26/2020 1002   VLDL 19 09/09/2018 0949   LDLCALC 47 10/31/2023 1046   LDLCALC 136 (H) 02/26/2020 1002   LDLCALC 66 10/02/2007 0000     Assessment & Plan:   1) Type 2 diabetes mellitus with stage 3 chronic kidney disease, with long-term current use of insulin    He presents today with his CGM showing above target glycemic profile overall.  His POCT A1c today is 10.2%, increasing from last visit of 8.4%.  Analysis of his CGM shows TIR 27%, TAR 73%, TBR 0% with a GMI of 8.7%.  He still notes he is stressed, still dealing with his brothers  estate problems after he passed.  He notes he has picked up a few pounds and has not eaten as healthy recently.   Recent labs reviewed.  - His diabetes is complicated by CAD, CKD, CVA, Retinopathy. Patient remains at a high risk for more acute and chronic complications of diabetes which include CAD, CVA, CKD, retinopathy, and neuropathy. These are all discussed in detail with the patient.  - Nutritional counseling repeated at each appointment due to patients tendency to fall back in to old habits.  - The patient admits there is a room for improvement in their diet and drink choices. -  Suggestion is made for the patient to avoid simple carbohydrates from their diet including Cakes, Sweet Desserts / Pastries, Ice Cream, Soda (diet and regular), Sweet Tea, Candies, Chips, Cookies, Sweet Pastries, Store Bought Juices, Alcohol in Excess of 1-2 drinks a day, Artificial Sweeteners, Coffee Creamer, and Sugar-free Products. This will help patient to have stable blood glucose profile and potentially avoid unintended weight gain.   - I encouraged the patient to switch to unprocessed or minimally processed complex starch and increased protein intake (animal or plant source), fruits, and vegetables.   - Patient is advised to stick to a routine mealtimes to eat 3 meals a day and avoid unnecessary snacks (to snack only to correct hypoglycemia).  -He is advised to increase his Toujeo  to 40 units SQ nightly and will change him to GIP therapy since he seems to not be responding favorably to Ozempic  anymore.  Will start him on Mounjaro  7.5 mg SQ weekly-does not need to start on lowest dose as he has been on highest dose Ozempic  for some time.  -He is encouraged to continue using his CGM to monitor glucose 4 times daily, before meals and before bed, and to call the clinic if he has readings less than 70 or greater than 200 for 3 tests in a row.  -Due to  CKD patient is not a candidate for Metformin ,  SGLT2i.  -Target numbers for A1c, LDL, HDL, Triglycerides, Waist Circumference were discussed in detail.  2) Lipids/HPL:  His recent lipid panel from 10/31/23 shows controlled LDL of 47 and elevated triglycerides of 170 (improving).  He is advised to continue Crestor  5 mg p.o. daily at bedtime, Omega-3 fatty acid supplement, and Fenofibrate 160 mg p.o. daily.  Side effects and precautions discussed with him.    3) Hypothyroidism:   -There are no recent TFTs to review. He is advised to continue his Levothyroxine  175 mcg po daily before breakfast.  Will recheck prior to next visit and adjust dose accordingly.   - We discussed about the correct intake of his thyroid  hormone, on empty stomach at fasting, with water, separated by at least 30 minutes from breakfast and other medications,  and separated by more than 4 hours from calcium , iron, multivitamins, acid reflux medications (PPIs). -Patient is made aware of the fact that thyroid  hormone replacement is needed for life, dose to be adjusted by periodic monitoring of thyroid  function tests.  4) Hypertension/BP- His blood pressure is controlled to target.  He is advised to continue meds as prescribed by PCP.  5) Vitamin D  Deficiency His most recent vitamin D  level was 22.2 on 10/02/21.  He takes an OTC supplement.  He could not remember to take his Ergocalciferol  weekly, therefore he stopped it.  6) Hypercalcemia- resolved His calcium  level was 11.5 on 10/02/21, worsening from previous reading.  This is the second occurrence of high calcium  levels.  He denies any personal or family history of thyroid  cancer, adrenal problems, parathyroid problems, kidney stones, or fragility fractures.  His repeat calcium  levels returned to normal of 10. Phosphorous and Magnesium was also WNL.  PTH was normal at 21, PTH-rp negative.  His urine studies were also unremarkable, ruling out [facility].  Given his labs have returned to normal, no need for further investigation at  this time.       I advised patient to maintain close follow up with his PCP for primary care needs.     I spent  49  minutes in the care of the patient today including review of labs from CMP, Lipids, Thyroid  Function, Hematology (current and previous including abstractions from other facilities); face-to-face time discussing  his blood glucose readings/logs, discussing hypoglycemia and hyperglycemia episodes and symptoms, medications doses, his options of short and long term treatment based on the latest standards of care / guidelines;  discussion about incorporating lifestyle medicine;  and documenting the encounter. Risk reduction counseling performed per USPSTF guidelines to reduce obesity and cardiovascular risk factors.     Please refer to Patient Instructions for Blood Glucose Monitoring and Insulin /Medications Dosing Guide  in media tab for additional information. Please  also refer to  Patient Self Inventory in the Media  tab for reviewed elements of pertinent patient history.  [name] participated in the discussions, expressed understanding, and voiced agreement with the above plans.  All questions were answered to his satisfaction. he is encouraged to contact clinic should he have any questions or concerns prior to his return visit.    Follow up plan: Return in about 3 months (around 07/15/2024) for Diabetes F/U- A1c and UM in office, Thyroid  follow up, Bring meter and logs.   [name], [facility] [address] [place], [place] [address] Phone: [phone] Fax: [phone]   04/15/2024, 9:15 AM

## 2024-04-15 NOTE — Addendum Note (Signed)
 Addended by: Frutoso Dimare J on: 04/15/2024 10:07 AM   Modules accepted: Orders

## 2024-05-19 LAB — COMPREHENSIVE METABOLIC PANEL WITH GFR
ALT: 39 IU/L (ref 0–44)
AST: 25 IU/L (ref 0–40)
Albumin: 4.4 g/dL (ref 3.9–4.9)
Alkaline Phosphatase: 48 IU/L (ref 47–123)
BUN/Creatinine Ratio: 17 (ref 10–24)
BUN: 35 mg/dL — ABNORMAL HIGH (ref 8–27)
Bilirubin Total: 0.3 mg/dL (ref 0.0–1.2)
CO2: 23 mmol/L (ref 20–29)
Calcium: 9.8 mg/dL (ref 8.6–10.2)
Chloride: 100 mmol/L (ref 96–106)
Creatinine, Ser: 2.12 mg/dL — ABNORMAL HIGH (ref 0.76–1.27)
Globulin, Total: 2.4 g/dL (ref 1.5–4.5)
Glucose: 308 mg/dL — ABNORMAL HIGH (ref 70–99)
Potassium: 5 mmol/L (ref 3.5–5.2)
Sodium: 138 mmol/L (ref 134–144)
Total Protein: 6.8 g/dL (ref 6.0–8.5)
eGFR: 33 mL/min/1.73 — ABNORMAL LOW (ref >59)

## 2024-05-19 LAB — TSH: TSH: 3.15 u[IU]/mL (ref 0.450–4.500)

## 2024-05-19 LAB — T4, FREE: Free T4: 0.97 ng/dL (ref 0.82–1.77)

## 2024-06-23 ENCOUNTER — Telehealth: Payer: Self-pay | Admitting: Cardiology

## 2024-06-23 MED ORDER — AMLODIPINE BESYLATE 5 MG PO TABS: 5.0000 mg | ORAL_TABLET | Freq: Every day | ORAL | 0 refills | Status: DC

## 2024-06-23 NOTE — Telephone Encounter (Signed)
 Doesn't have a PCP at the moment and want Dr. Alvan to refill Heart medications that was filled once by Dr. Bertell.

## 2024-06-23 NOTE — Telephone Encounter (Signed)
 Contacted patient to get name of medication he needed and he was unable to give name but says it was a blood pressure medication that he needed refills on. Advised that [facility] would be contacted to verify which BP medication needed refills.  Contacted [facility] and spoke with Penne who says patient is prescribed amlodipine 5 mg daily, last filled 05/15/2024 #30 by Bertell. Per Penne, patient is not refilling losartan  50 mg, last filled 05/05/2024 #90.   Amlodipine 5 mg refill sent.

## 2024-06-23 NOTE — Telephone Encounter (Signed)
 Pt is requesting Dr. Alvan to refill medications that his PCP Dr Bertell was refill until he finds another PCP. Please address

## 2024-07-15 ENCOUNTER — Ambulatory Visit

## 2024-07-16 ENCOUNTER — Encounter: Payer: Self-pay | Admitting: Nurse Practitioner

## 2024-07-16 ENCOUNTER — Ambulatory Visit: Admitting: Nurse Practitioner

## 2024-07-16 VITALS — BP 122/74 | HR 86 | Ht 69.0 in | Wt 242.4 lb

## 2024-07-16 DIAGNOSIS — Z794 Long term (current) use of insulin: Secondary | ICD-10-CM

## 2024-07-16 DIAGNOSIS — E039 Hypothyroidism, unspecified: Secondary | ICD-10-CM | POA: Diagnosis not present

## 2024-07-16 DIAGNOSIS — E1159 Type 2 diabetes mellitus with other circulatory complications: Secondary | ICD-10-CM | POA: Diagnosis not present

## 2024-07-16 DIAGNOSIS — Z7985 Long-term (current) use of injectable non-insulin antidiabetic drugs: Secondary | ICD-10-CM

## 2024-07-16 DIAGNOSIS — I10 Essential (primary) hypertension: Secondary | ICD-10-CM | POA: Diagnosis not present

## 2024-07-16 DIAGNOSIS — E559 Vitamin D deficiency, unspecified: Secondary | ICD-10-CM

## 2024-07-16 DIAGNOSIS — E782 Mixed hyperlipidemia: Secondary | ICD-10-CM

## 2024-07-16 LAB — POCT GLYCOSYLATED HEMOGLOBIN (HGB A1C): Hemoglobin A1C: 9 % — AB (ref 4.0–5.6)

## 2024-07-16 MED ORDER — FREESTYLE LIBRE 2 PLUS SENSOR MISC: 3 refills | Status: AC

## 2024-07-16 MED ORDER — TIRZEPATIDE 7.5 MG/0.5ML ~~LOC~~ SOAJ: 7.5000 mg | SUBCUTANEOUS | 1 refills | Status: AC

## 2024-07-16 MED ORDER — TOUJEO MAX SOLOSTAR 300 UNIT/ML ~~LOC~~ SOPN: 40.0000 [IU] | PEN_INJECTOR | Freq: Every day | SUBCUTANEOUS | 3 refills | Status: AC

## 2024-07-16 MED ORDER — LEVOTHYROXINE SODIUM 175 MCG PO TABS: 175.0000 ug | ORAL_TABLET | Freq: Every day | ORAL | 1 refills | Status: AC

## 2024-07-16 NOTE — Progress Notes (Signed)
 07/16/2024   Endocrinology follow-up note   Subjective:    Patient ID: [name], male    DOB: September 09, 1955,    Past Medical History:  Diagnosis Date   ASCVD (arteriosclerotic cardiovascular disease)    -MI in 01/2001 prompted CABG; EF-30% at cath; 50% on echo in 7/02 and normal in 2005   Asthma    Cerebrovascular disease     L CEA 06/2004 following left renal embolism; 10/2008 plaque w/o focal stenosis   Chronic back pain    Chronic leg pain    Chronic obstructive pulmonary disease (HCC)    Degenerative joint disease     chronic LBP-s/p L3-4 fusion   Diabetes mellitus    -no insulin    Hyperlipidemia    Hypertension    Hypothyroidism    Obesity    Restless leg syndrome    Possible   Tobacco abuse, in remission    50 pack years; discontinued in 2002   Past Surgical History:  Procedure Laterality Date   CAROTID ENDARTERECTOMY  08/14/2003   Left   CATARACT EXTRACTION     bilateral   CHOLECYSTECTOMY N/A 02/06/2021   Procedure: LAPAROSCOPIC CHOLECYSTECTOMY;  Surgeon: Mavis Anes, MD;  Location: [facility];  Service: General;  Laterality: N/A;   CORONARY ARTERY BYPASS GRAFT  08/13/2000   GALLBLADDER SURGERY  02/07/2021   LUMBAR SPINE SURGERY     L3-4 fusion   Social History   Socioeconomic History   Marital status: Divorced    Spouse name: Not on file   Number of children: 1   Years of education: Not on file   Highest education level: Not on file  Occupational History   Occupation: veteran    Comment: disabledf  Tobacco Use   Smoking status: Former    Types: Cigarettes   Smokeless tobacco: Never  Vaping Use   Vaping status: Never Used  Substance and Sexual Activity   Alcohol use: No   Drug use: No   Sexual activity: Not Currently  Other Topics Concern   Not on file  Social History Narrative   Not on file   Social Drivers of Health   Financial Resource Strain: Low Risk  (04/10/2022)   Overall Financial Resource Strain (CARDIA)    Difficulty of  Paying Living Expenses: Not hard at all  Food Insecurity: Not on file  Transportation Needs: No Transportation Needs (04/10/2022)   PRAPARE - Transportation    Lack of Transportation (Medical): No    Lack of Transportation (Non-Medical): No  Physical Activity: Not on file  Stress: Not on file  Social Connections: Unknown (12/26/2021)   Received from [facility]   Social Network    Social Network: Not on file   Outpatient Encounter Medications as of 07/16/2024  Medication Sig   Acetaminophen  (TYLENOL  PO) Take by mouth. As needed   albuterol  (VENTOLIN  HFA) 108 (90 Base) MCG/ACT inhaler Inhale 1-2 puffs into the lungs every 6 (six) hours as needed for shortness of breath or wheezing.   albuterol -ipratropium (COMBIVENT) 18-103 MCG/ACT inhaler Inhale 2 puffs into the lungs every 6 (six) hours as needed for wheezing or shortness of breath.    amLODipine  (NORVASC ) 5 MG tablet Take 1 tablet (5 mg total) by mouth daily.   Ascorbic Acid (VITAMIN C) 1000 MG tablet Take 1,000 mg by mouth daily.   aspirin 81 MG tablet Take 81 mg by mouth daily.     B Complex-Biotin-FA (VITAMIN B50 COMPLEX PO) Take 1 capsule by mouth  daily.   Cholecalciferol (VITAMIN D  PO) Take 1 capsule by mouth daily.   Continuous Blood Gluc Receiver (FREESTYLE LIBRE 2 READER) DEVI Use to monitor glucose 4 times daily   diazepam (VALIUM) 10 MG tablet Take 5 mg by mouth at bedtime as needed for anxiety.   docusate sodium (COLACE) 100 MG capsule Take 200 mg by mouth daily.   fenofibrate 160 MG tablet Take 160 mg by mouth daily.   fish oil-omega-3 fatty acids 1000 MG capsule Take 2 g by mouth daily.   gabapentin (NEURONTIN) 300 MG capsule Take 300 mg by mouth at bedtime.   glucose blood (FREESTYLE LITE) test strip Use as instructed to monitor glucose 4 times daily- use as back up to CGM [employer]).   Insulin  Pen Needle (SURE COMFORT PEN NEEDLES) 31G X 8 MM MISC Use to inject insulin  once daily   Multiple Vitamins-Minerals (MULTIVITAMIN  WITH MINERALS) tablet Take 1 tablet by mouth daily.     nitroGLYCERIN (NITROSTAT) 0.4 MG SL tablet Place 0.4 mg under the tongue every 5 (five) minutes as needed for chest pain.   OVER THE COUNTER MEDICATION Vitamin B 50 - patient reports that he takes daily.   rosuvastatin  (CRESTOR ) 40 MG tablet Take 1 tablet (40 mg total) by mouth daily.   [DISCONTINUED] Continuous Glucose Sensor (FREESTYLE LIBRE 2 PLUS SENSOR) MISC Change sensor every 15 days.   [DISCONTINUED] insulin  glargine, 2 Unit Dial , (TOUJEO  MAX SOLOSTAR) 300 UNIT/ML Solostar Pen Inject 40 Units into the skin at bedtime.   [DISCONTINUED] levothyroxine  (SYNTHROID ) 175 MCG tablet Take 1 tablet (175 mcg total) by mouth daily before breakfast.   [DISCONTINUED] tirzepatide  (MOUNJARO ) 7.5 MG/0.5ML Pen Inject 7.5 mg into the skin once a week.   Continuous Glucose Sensor (FREESTYLE LIBRE 2 PLUS SENSOR) MISC Change sensor every 15 days.   insulin  glargine, 2 Unit Dial , (TOUJEO  MAX SOLOSTAR) 300 UNIT/ML Solostar Pen Inject 40 Units into the skin at bedtime.   levothyroxine  (SYNTHROID ) 175 MCG tablet Take 1 tablet (175 mcg total) by mouth daily before breakfast.   tirzepatide  (MOUNJARO ) 7.5 MG/0.5ML Pen Inject 7.5 mg into the skin once a week.   [DISCONTINUED] HYDROmorphone  (DILAUDID ) 4 MG tablet Take 4 mg by mouth 3 (three) times daily. (Patient not taking: Reported on 07/16/2024)   [DISCONTINUED] losartan  (COZAAR ) 50 MG tablet TAKE ONE TABLET BY MOUTH ONCE DAILY. (Patient not taking: Reported on 07/16/2024)   No facility-administered encounter medications on file as of 07/16/2024.   ALLERGIES: Allergies  Allergen Reactions   Alprazolam Nausea And Vomiting and Other (See Comments)    Altered mental status   Atorvastatin Nausea And Vomiting   Gemfibrozil     unknown   Hydromorphone  Other (See Comments)    numbness   Invokana [Canagliflozin] Diarrhea and Nausea And Vomiting   Definity  [Perflutren  Lipid Microsphere] Other (See Comments)     Headache   VACCINATION STATUS: Immunization History  Administered Date(s) Administered   Influenza-Unspecified 05/13/2014    Diabetes He presents for his follow-up diabetic visit. He has type 2 diabetes mellitus. Onset time: He was diagnosed at approximate age of 50 years. His disease course has been improving. There are no hypoglycemic associated symptoms. Pertinent negatives for hypoglycemia include no confusion, headaches or pallor. Associated symptoms include foot paresthesias and weight loss. Pertinent negatives for diabetes include no fatigue, no polydipsia, no polyphagia and no polyuria. Hypoglycemia complications include nocturnal hypoglycemia. Symptoms are stable. Diabetic complications include a CVA, heart disease, nephropathy and peripheral neuropathy. Risk  factors for coronary artery disease include diabetes mellitus, dyslipidemia, hypertension, male sex, tobacco exposure, sedentary lifestyle and obesity. Current diabetic treatment includes insulin  injections (and Mounjaro ). He is compliant with treatment most of the time. His weight is increasing rapidly. He is following a generally healthy diet. When asked about meal planning, he reported none. He has had a previous visit with a dietitian. He participates in exercise intermittently. His home blood glucose trend is decreasing steadily. His overall blood glucose range is 140-180 mg/dl. (He presents today with his CGM showing at target glycemic profile overall.  His POCT A1c today is 9%, improving from last visit of 10.2%.  Analysis of his CGM shows TIR 76%, TAR 24%, TBR 0% with a GMI of 6.9%.  He has tolerated the switch to Mounjaro  well, no unpleasant side effects.  He has lost some weight and feels better overall!) An ACE inhibitor/angiotensin II receptor blocker is being taken. He does not see a podiatrist.Eye exam is current.  Thyroid  Problem Presents for follow-up (He has longstanding hypothyroidism, currently on levothyroxine  150 mcg  p.o. daily before breakfast.  He reports compliance with medication.) visit. Symptoms include weight loss. Patient reports no cold intolerance, constipation, depressed mood, diarrhea, fatigue, heat intolerance, palpitations or weight gain. The symptoms have been stable.     Review of systems  Constitutional: + steadily decreasing body weight,  current Body mass index is 35.8 kg/m. , + fatigue, no subjective hyperthermia, no subjective hypothermia Eyes: no blurry vision, no xerophthalmia ENT: no sore throat, no nodules palpated in throat, no dysphagia/odynophagia, no hoarseness Cardiovascular: no chest pain, no shortness of breath, no palpitations, no leg swelling Respiratory: no cough, no shortness of breath Gastrointestinal: no nausea/vomiting/diarrhea Musculoskeletal: diffuse muscle/joint aches Skin: no rashes, no hyperemia Neurological: no tremors, no numbness, no tingling, no dizziness Psychiatric: no depression, no anxiety   Objective:    BP 122/74 (BP Location: Right Arm, Patient Position: Sitting, Cuff Size: Large)   Pulse 86   Ht 5' 9 (1.753 m)   Wt 242 lb 6.4 oz (110 kg)   BMI 35.80 kg/m   Wt Readings from Last 3 Encounters:  07/16/24 242 lb 6.4 oz (110 kg)  04/15/24 256 lb 9.6 oz (116.4 kg)  01/20/24 260 lb (117.9 kg)   BP Readings from Last 3 Encounters:  07/16/24 122/74  04/15/24 130/80  01/20/24 130/72     Physical Exam- Limited  Constitutional:  Body mass index is 35.8 kg/m. , not in acute distress, normal state of mind Eyes:  EOMI, no exophthalmos Musculoskeletal: no gross deformities, strength intact in all four extremities, no gross restriction of joint movements Skin:  no rashes, no hyperemia Neurological: no tremor with outstretched hands   Diabetic Foot Exam - Simple   Simple Foot Form Diabetic Foot exam was performed with the following findings: Yes 07/16/2024 11:19 AM  Visual Inspection No deformities, no ulcerations, no other skin breakdown  bilaterally: Yes Sensation Testing Intact to touch and monofilament testing bilaterally: Yes Pulse Check Posterior Tibialis and Dorsalis pulse intact bilaterally: Yes Comments     Results for orders placed or performed in visit on 07/16/24  HgB A1c   Collection Time: 07/16/24 11:18 AM  Result Value Ref Range   Hemoglobin A1C 9.0 (A) 4.0 - 5.6 %   HbA1c POC (<> result, manual entry)     HbA1c, POC (prediabetic range)     HbA1c, POC (controlled diabetic range)     Lab Results  Component Value Date  WBC 11.9 (H) 01/31/2021   HGB 14.1 01/31/2021   HCT 43.5 01/31/2021   MCV 91.6 01/31/2021   PLT 227 01/31/2021    Lab Results  Component Value Date   HGBA1C 9.0 (A) 07/16/2024   HGBA1C 10.2 (A) 04/15/2024   HGBA1C 8.4 (A) 12/02/2023   MICROALBUR 80 01/22/2022   MICROALBUR 77.4 02/26/2020     Lipid Panel     Component Value Date/Time   CHOL 112 10/31/2023 1046   TRIG 170 (H) 10/31/2023 1046   TRIG 329 10/02/2007 0000   HDL 37 (L) 10/31/2023 1046   CHOLHDL 3.0 10/31/2023 1046   CHOLHDL 4.9 02/26/2020 1002   VLDL 19 09/09/2018 0949   LDLCALC 47 10/31/2023 1046   LDLCALC 136 (H) 02/26/2020 1002   LDLCALC 66 10/02/2007 0000     Assessment & Plan:   1) Type 2 diabetes mellitus with stage 3 chronic kidney disease, with long-term current use of insulin    He presents today with his CGM showing at target glycemic profile overall.  His POCT A1c today is 9%, improving from last visit of 10.2%.  Analysis of his CGM shows TIR 76%, TAR 24%, TBR 0% with a GMI of 6.9%.  He has tolerated the switch to Mounjaro  well, no unpleasant side effects.  He has lost some weight and feels better overall!   Recent labs reviewed.  - His diabetes is complicated by CAD, CKD, CVA, Retinopathy. Patient remains at a high risk for more acute and chronic complications of diabetes which include CAD, CVA, CKD, retinopathy, and neuropathy. These are all discussed in detail with the patient.  -  Nutritional counseling repeated/built upon at each appointment.  - The patient admits there is a room for improvement in their diet and drink choices. -  Suggestion is made for the patient to avoid simple carbohydrates from their diet including Cakes, Sweet Desserts / Pastries, Ice Cream, Soda (diet and regular), Sweet Tea, Candies, Chips, Cookies, Sweet Pastries, Store Bought Juices, Alcohol in Excess of 1-2 drinks a day, Artificial Sweeteners, Coffee Creamer, and Sugar-free Products. This will help patient to have stable blood glucose profile and potentially avoid unintended weight gain.   - I encouraged the patient to switch to unprocessed or minimally processed complex starch and increased protein intake (animal or plant source), fruits, and vegetables.   - Patient is advised to stick to a routine mealtimes to eat 3 meals a day and avoid unnecessary snacks (to snack only to correct hypoglycemia).   - Patient is advised to stick to a routine mealtimes to eat 3 meals a day and avoid unnecessary snacks (to snack only to correct hypoglycemia).  -He is advised to decrease his Toujeo  to 30 units SQ nightly and continue Mounjaro  7.5 mg SQ weekly.  May increase this at next visit to help reduce insulin  burden.  -He is encouraged to continue using his CGM to monitor glucose 4 times daily, before meals and before bed, and to call the clinic if he has readings less than 70 or greater than 200 for 3 tests in a row.  -Due to CKD patient is not a candidate for Metformin , SGLT2i.  -Target numbers for A1c, LDL, HDL, Triglycerides, Waist Circumference were discussed in detail.  2) Lipids/HPL:  His recent lipid panel from 10/31/23 shows controlled LDL of 47 and elevated triglycerides of 170 (improving).  He is advised to continue Crestor  5 mg p.o. daily at bedtime, Omega-3 fatty acid supplement, and Fenofibrate 160 mg p.o. daily.  Side effects and precautions discussed with him.    3) Hypothyroidism:    -His recent TFTs are consistent with appropriate hormone replacement. He is advised to continue his Levothyroxine  175 mcg po daily before breakfast.     - The correct intake of thyroid  hormone (Levothyroxine , Synthroid ), is on empty stomach first thing in the morning, with water, separated by at least 30 minutes from breakfast and other medications,  and separated by more than 4 hours from calcium , iron, multivitamins, acid reflux medications (PPIs).  - This medication is a life-long medication and will be needed to correct thyroid  hormone imbalances for the rest of your life.  The dose may change from time to time, based on thyroid  blood work.  - It is extremely important to be consistent taking this medication, near the same time each morning.  -AVOID TAKING PRODUCTS CONTAINING BIOTIN (commonly found in Hair, Skin, Nails vitamins) AS IT INTERFERES WITH THE VALIDITY OF THYROID  FUNCTION BLOOD TESTS.  4) Hypertension/BP- His blood pressure is controlled to target.  He is advised to continue meds as prescribed by PCP.  5) Vitamin D  Deficiency His most recent vitamin D  level was 22.2 on 10/02/21.  He takes an OTC supplement.  He could not remember to take his Ergocalciferol  weekly, therefore he stopped it.  6) Hypercalcemia- resolved His calcium  level was 11.5 on 10/02/21, worsening from previous reading.  This is the second occurrence of high calcium  levels.  He denies any personal or family history of thyroid  cancer, adrenal problems, parathyroid problems, kidney stones, or fragility fractures.  His repeat calcium  levels returned to normal of 10. Phosphorous and Magnesium was also WNL.  PTH was normal at 21, PTH-rp negative.  His urine studies were also unremarkable, ruling out [facility].  Given his labs have returned to normal, no need for further investigation at this time.       I advised patient to maintain close follow up with his PCP for primary care needs.     I spent  32  minutes in  the care of the patient today including review of labs from CMP, Lipids, Thyroid  Function, Hematology (current and previous including abstractions from other facilities); face-to-face time discussing  his blood glucose readings/logs, discussing hypoglycemia and hyperglycemia episodes and symptoms, medications doses, his options of short and long term treatment based on the latest standards of care / guidelines;  discussion about incorporating lifestyle medicine;  and documenting the encounter. Risk reduction counseling performed per USPSTF guidelines to reduce obesity and cardiovascular risk factors.     Please refer to Patient Instructions for Blood Glucose Monitoring and Insulin /Medications Dosing Guide  in media tab for additional information. Please  also refer to  Patient Self Inventory in the Media  tab for reviewed elements of pertinent patient history.  [name] participated in the discussions, expressed understanding, and voiced agreement with the above plans.  All questions were answered to his satisfaction. he is encouraged to contact clinic should he have any questions or concerns prior to his return visit.    Follow up plan: Return in about 4 months (around 11/14/2024) for Diabetes F/U with A1c in office, No previsit labs, Bring meter and logs.   Benton Rio, [facility] [address] [place], [place] [address] Phone: [phone] Fax: [phone]   07/16/2024, 11:28 AM

## 2024-07-21 ENCOUNTER — Ambulatory Visit: Attending: Cardiology

## 2024-07-21 ENCOUNTER — Ambulatory Visit (HOSPITAL_COMMUNITY)

## 2024-07-21 ENCOUNTER — Ambulatory Visit: Payer: Self-pay | Admitting: Cardiology

## 2024-07-21 DIAGNOSIS — I679 Cerebrovascular disease, unspecified: Secondary | ICD-10-CM

## 2024-07-21 DIAGNOSIS — I6523 Occlusion and stenosis of bilateral carotid arteries: Secondary | ICD-10-CM

## 2024-07-27 ENCOUNTER — Ambulatory Visit

## 2024-07-27 VITALS — BP 124/86 | HR 104 | Temp 98.1°F | Ht 69.0 in | Wt 253.0 lb

## 2024-07-27 DIAGNOSIS — Z862 Personal history of diseases of the blood and blood-forming organs and certain disorders involving the immune mechanism: Secondary | ICD-10-CM

## 2024-07-27 DIAGNOSIS — J449 Chronic obstructive pulmonary disease, unspecified: Secondary | ICD-10-CM

## 2024-07-27 DIAGNOSIS — F419 Anxiety disorder, unspecified: Secondary | ICD-10-CM

## 2024-07-27 DIAGNOSIS — Z794 Long term (current) use of insulin: Secondary | ICD-10-CM

## 2024-07-27 DIAGNOSIS — E782 Mixed hyperlipidemia: Secondary | ICD-10-CM | POA: Diagnosis not present

## 2024-07-27 DIAGNOSIS — E66812 Obesity, class 2: Secondary | ICD-10-CM | POA: Diagnosis not present

## 2024-07-27 DIAGNOSIS — N183 Chronic kidney disease, stage 3 unspecified: Secondary | ICD-10-CM | POA: Diagnosis not present

## 2024-07-27 DIAGNOSIS — E1122 Type 2 diabetes mellitus with diabetic chronic kidney disease: Secondary | ICD-10-CM | POA: Diagnosis not present

## 2024-07-27 DIAGNOSIS — Z7985 Long-term (current) use of injectable non-insulin antidiabetic drugs: Secondary | ICD-10-CM

## 2024-07-27 DIAGNOSIS — Z6837 Body mass index (BMI) 37.0-37.9, adult: Secondary | ICD-10-CM | POA: Diagnosis not present

## 2024-07-27 DIAGNOSIS — F4310 Post-traumatic stress disorder, unspecified: Secondary | ICD-10-CM | POA: Diagnosis not present

## 2024-07-27 DIAGNOSIS — Z7689 Persons encountering health services in other specified circumstances: Secondary | ICD-10-CM

## 2024-07-27 DIAGNOSIS — Z1211 Encounter for screening for malignant neoplasm of colon: Secondary | ICD-10-CM

## 2024-07-27 DIAGNOSIS — Z113 Encounter for screening for infections with a predominantly sexual mode of transmission: Secondary | ICD-10-CM

## 2024-07-27 MED ORDER — DIAZEPAM 5 MG PO TABS: 5.0000 mg | ORAL_TABLET | Freq: Two times a day (BID) | ORAL | 0 refills | Status: DC | PRN

## 2024-07-27 MED ORDER — ALBUTEROL SULFATE HFA 108 (90 BASE) MCG/ACT IN AERS: 1.0000 | INHALATION_SPRAY | Freq: Four times a day (QID) | RESPIRATORY_TRACT | 2 refills | Status: DC | PRN

## 2024-07-27 MED ORDER — ALBUTEROL SULFATE HFA 108 (90 BASE) MCG/ACT IN AERS: 1.0000 | INHALATION_SPRAY | Freq: Four times a day (QID) | RESPIRATORY_TRACT | 2 refills | Status: AC | PRN

## 2024-07-27 NOTE — Progress Notes (Signed)
 New Patient Office Visit  Subjective    Patient ID: [name], male    DOB: 1955-10-03  Age: 68 y.o. MRN: [id number]  HPI [name] presents to establish care. Currently follows up with nephrology, endocrinology and cardiology. Would like refills on his diazepam  and albuterol .  Patient states that he is taking his diazepam  10 mg 3 times daily.  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  Was initially put on the medication due to not being able to fall sleep due to flashbacks.  Reports that he will sometimes take two 10 mg pills at night to help fall asleep.  Says that he only gets 2 hours of sleep at night if he does not take the diazepam , but is able to get up to 7 hours when he takes the medication.  Reports that he has taken multiple antidepressants and the only thing that would work is the diazepam .  Has tried counseling in the past with the [facility], but says it did not help.  Reports to me that the medication helps to take the edge off, and he is able to do his daily activities without feeling anxious.  Says that sometimes his anxiety makes him agitated at times.  Reports that he does not drive on the medication.   Would also like a refill on his albuterol  inhaler.  He only takes this inhaler as needed.  Has a history of COPD, but does not follow-up with pulmonology.  Says that he is very sensitive to smells like perfumes and cigarette smoke and he will begin to get short of breath and experience cough.  Says that his albuterol  inhaler helps with his symptoms. Is not taking combivent inhaler at this time.   Reports that he is doing well managing his diabetes.  Follows up with Dr. Lenis at endocrinology.  Was last seen by endocrinology on 07/16/2024.  Reports that he has had weight loss after starting Mounjaro .  Patient reports that he has made the effort to avoid high carbohydrates in his diet.  Currently monitors his blood sugar before meals and bedtime. Reports that he  sees the eye doctor once a year, and was last seen two months ago.   Patient is currently taking OTC ferrous sulfate supplement, as he has a history of iron deficiency anemia in the past. He would like updated labs. Denies fatigue.   Past Medical History:  Diagnosis Date   ASCVD (arteriosclerotic cardiovascular disease)    -MI in 01/2001 prompted CABG; EF-30% at cath; 50% on echo in 7/02 and normal in 2005   Asthma    Cerebrovascular disease     L CEA 06/2004 following left renal embolism; 10/2008 plaque w/o focal stenosis   Chronic back pain    Chronic leg pain    Chronic obstructive pulmonary disease (HCC)    Degenerative joint disease     chronic LBP-s/p L3-4 fusion   Diabetes mellitus    -no insulin    Hyperlipidemia    Hypertension    Hypothyroidism    Obesity    Restless leg syndrome    Possible   Tobacco abuse, in remission    50 pack years; discontinued in 2002    Past Surgical History:  Procedure Laterality Date   CAROTID ENDARTERECTOMY  08/14/2003   Left   CATARACT EXTRACTION     bilateral   CHOLECYSTECTOMY N/A 02/06/2021   Procedure: LAPAROSCOPIC CHOLECYSTECTOMY;  Surgeon: Mavis Anes, MD;  Location:  [facility];  Service: General;  Laterality: N/A;   CORONARY ARTERY BYPASS GRAFT  08/13/2000   GALLBLADDER SURGERY  02/07/2021   LUMBAR SPINE SURGERY     L3-4 fusion    History reviewed. No pertinent family history.  Social History   Socioeconomic History   Marital status: Divorced    Spouse name: Not on file   Number of children: 1   Years of education: Not on file   Highest education level: Not on file  Occupational History   Occupation: veteran    Comment: disabledf  Tobacco Use   Smoking status: Former    Types: Cigarettes   Smokeless tobacco: Never  Vaping Use   Vaping status: Never Used  Substance and Sexual Activity   Alcohol use: No   Drug use: No   Sexual activity: Not Currently  Other Topics Concern   Not on file  Social History Narrative    Not on file   Social Drivers of Health   Tobacco Use: Medium Risk (07/27/2024)   Patient History    Smoking Tobacco Use: Former    Smokeless Tobacco Use: Never    Passive Exposure: Not on Actuary Strain: Low Risk (04/10/2022)   Overall Financial Resource Strain (CARDIA)    Difficulty of Paying Living Expenses: Not hard at all  Food Insecurity: Not on file  Transportation Needs: No Transportation Needs (04/10/2022)   PRAPARE - Administrator, Civil Service (Medical): No    Lack of Transportation (Non-Medical): No  Physical Activity: Not on file  Stress: Not on file  Social Connections: Unknown (12/26/2021)   Received from [facility]   Social Network    Social Network: Not on file  Intimate Partner Violence: Unknown (11/17/2021)   Received from [facility]   HITS    Physically Hurt: Not on file    Insult or Talk Down To: Not on file    Threaten Physical Harm: Not on file    Scream or Curse: Not on file  Depression (PHQ2-9): Low Risk (07/27/2024)   Depression (PHQ2-9)    PHQ-2 Score: 3  Alcohol Screen: Not on file  Housing: Low Risk (04/10/2022)   Housing    Last Housing Risk Score: 0  Utilities: Not on file  Health Literacy: Not on file    Review of Systems  Constitutional:  Negative for fatigue and fever.  Eyes:  Negative for visual disturbance.  Respiratory:  Negative for choking, chest tightness, shortness of breath and wheezing.   Cardiovascular:  Negative for chest pain and palpitations.  Psychiatric/Behavioral:  Positive for sleep disturbance. Negative for suicidal ideas. The patient is nervous/anxious.    Objective    BP 124/86 (BP Location: Right Arm)   Pulse (!) 104   Temp 98.1 F (36.7 C)   Ht 5' 9 (1.753 m)   Wt 253 lb (114.8 kg)   SpO2 93%   BMI 37.36 kg/m   Physical Exam Vitals and nursing note reviewed.  Constitutional:      General: He is not in acute distress.    Appearance: Normal appearance. He is not  ill-appearing.  Cardiovascular:     Rate and Rhythm: Normal rate and regular rhythm.     Heart sounds: Normal heart sounds. No murmur heard. Pulmonary:     Effort: Pulmonary effort is normal. No respiratory distress.     Breath sounds: Normal breath sounds. No wheezing.  Musculoskeletal:     Right lower leg: Edema present.  Left lower leg: Edema present.     Comments: Non-pitting.   Neurological:     Mental Status: He is alert.  Psychiatric:        Mood and Affect: Mood normal.        Behavior: Behavior normal.        Thought Content: Thought content normal.        Judgment: Judgment normal.       07/27/2024   10:49 AM 07/03/2018    2:22 PM 10/24/2017    1:49 PM 01/15/2017   10:55 AM 10/11/2016   11:14 AM  Depression screen PHQ 2/9  Decreased Interest 1 0 0 0 0  Down, Depressed, Hopeless 0 0 0  0  PHQ - 2 Score 1 0 0 0 0  Altered sleeping 2      Change in appetite 0      Feeling bad or failure about yourself  0      Trouble concentrating 0      Moving slowly or fidgety/restless 0      Suicidal thoughts 0      PHQ-9 Score 3      Difficult doing work/chores Somewhat difficult           07/27/2024   10:50 AM  GAD 7 : Generalized Anxiety Score  Nervous, Anxious, on Edge 1  Control/stop worrying 0  Worry too much - different things 0  Trouble relaxing 2  Restless 2  Easily annoyed or irritable 0  Afraid - awful might happen 0  Total GAD 7 Score 5  Anxiety Difficulty Somewhat difficult   Assessment & Plan:  1. Encounter to establish care (Primary)   2. Type 2 diabetes mellitus with stage 3 chronic kidney disease, with long-term current use of insulin , unspecified whether stage 3a or 3b CKD (HCC) -Advised patient to continue taking diabetic medications as prescribed. Patient to follow up with endocrinology for medication management.  -Advised patient to follow a healthy diet eliminating foods high in carbohydrates and sugar.  -Asked patient to call ophthalmology to  send eye exam records.  - Microalbumin / creatinine urine ratio  3. Anxiety -Talked with patient and let him know that he is on a very high amount of benzodiazepines and I would like to taper him down at this time.  Advised patient that this is due to safety reasons as benzodiazepines put him at increased risk for falls.  Patient verbalized understanding.  Patient says that he has weaned off of all pain medication, but I spoke with the patient about how benzodiazepines put his safety at risk.   -Advised patient to not completely stop this medication as it can cause withdrawal seizures. -Patient to take 5 mg twice daily for 1 month and then follow-up in the office.  Explained to patient that we can add other medications to help level out his anxiety levels throughout the day in attempt to wean off of Valium  completely. Patient is worried about his anxiety levels increasing, but agrees with plan.  - diazepam  (VALIUM ) 5 MG tablet; Take 1 tablet (5 mg total) by mouth 2 (two) times daily as needed for anxiety.  Dispense: 60 tablet; Refill: 0  4. PTSD (post-traumatic stress disorder) -See above.  - diazepam  (VALIUM ) 5 MG tablet; Take 1 tablet (5 mg total) by mouth 2 (two) times daily as needed for anxiety.  Dispense: 60 tablet; Refill: 0  5. Mixed hyperlipidemia -Patient to continue taking fenofibrate and crestor  at this time. Will recheck  labs to make sure his lipids are in range that is appropriate for his ASCVD risk. Patient is at an increased risk due to prior MI and CVA.  -Educated patient about decreasing foods high in cholesterol and fat due to his increased CVD risk.  - Lipid panel  6. History of iron deficiency anemia -Patient is taking ferrous sulfate supplement as prescribed by previous PCP. Would like to get updated labs at this time.  - CBC with Differential/Platelet  7. Screening examination for STD (sexually transmitted disease) - Hepatitis C antibody  8. Obesity, morbid  (HCC) -Advised patient to continue minimizing carbohydrates in his diet, and exercise as tolerated.   9. Chronic obstructive pulmonary disease, unspecified COPD type (HCC) -Educated patient that this inhaler is to be used on an as needed basis, and if he begins to start using this inhaler more than two times per week then he may need to be started on a maintenance medication to manage symptoms.  - albuterol  (VENTOLIN  HFA) 108 (90 Base) MCG/ACT inhaler; Inhale 1-2 puffs into the lungs every 6 (six) hours as needed for shortness of breath or wheezing.  Dispense: 6.7 g; Refill: 2  10. Screening for colon cancer - Cologuard   Return in about 4 weeks (around 08/24/2024).   Damien KATHEE Pringle, FNP

## 2024-07-28 LAB — HEPATITIS C ANTIBODY: Hep C Virus Ab: NONREACTIVE

## 2024-07-28 LAB — MICROALBUMIN / CREATININE URINE RATIO
Creatinine, Urine: 126.5 mg/dL
Microalb/Creat Ratio: 183 mg/g{creat} — ABNORMAL HIGH (ref 0–29)
Microalbumin, Urine: 231.2 ug/mL

## 2024-07-30 ENCOUNTER — Ambulatory Visit: Payer: Self-pay

## 2024-07-30 NOTE — Progress Notes (Signed)
 Please call the patient and let him know that he had a small amount of protein called albumin in his urine. In patients with diabetes, this can be an early sign that high blood sugar is putting stress on the kidneys. His microalbumin levels have been higher in the past, but would like to start you on lisinopril which is a blood pressure medication that is also used to help protect the kidneys. BUN and creatinine was increased in the past as well, which is also a sign of stress on the kidneys. Let me know if he would like to start this. Will need to monitor blood pressure at home, and if blood pressure drops below 100 systolic to let us  know.   Was negative for hepatitis C.

## 2024-08-07 NOTE — Progress Notes (Signed)
 Called PT left voicemail to call us  back Did elaborate some on results

## 2024-08-10 NOTE — Progress Notes (Signed)
 Called patient and let a voicemail to give us  a call back

## 2024-08-11 NOTE — Progress Notes (Signed)
 Called patient and left voicemail, sending letter

## 2024-08-14 ENCOUNTER — Ambulatory Visit

## 2024-08-14 VITALS — BP 150/98 | HR 100 | Temp 98.1°F | Ht 69.0 in | Wt 248.4 lb

## 2024-08-14 DIAGNOSIS — F4310 Post-traumatic stress disorder, unspecified: Secondary | ICD-10-CM

## 2024-08-14 DIAGNOSIS — I10 Essential (primary) hypertension: Secondary | ICD-10-CM | POA: Diagnosis not present

## 2024-08-14 NOTE — Progress Notes (Signed)
 "  Acute Office Visit  Subjective:     Patient ID: [name], male    DOB: 02/21/1956, 69 y.o.   MRN: [id number]  Chief Complaint  Patient presents with   Insomnia    PT states he is only able to sleep about 1-2 hours a night He states that he gets easily frustrated with many different things and could just snap but have to bite tongue   Medication review    PT is here to go over his med list PT would like to discuss diazepam  PT also stated he has jury duty but does not believe he is able to go    Insomnia Primary symptoms: sleep disturbance.     Patient is in today for a follow-up regarding his diazepam . Reports that he is having a hard time decreasing his diazepam  dose. Reports that he was taking 10 mg diazepam  once in the morning, and 20 mg before bedtime, before his dose was decreased. Says he is fidgeting a lot and his restless leg is back. Endorses anger problems and says that he has to bite his tongue a lot. Got a letter recently to report for jury duty, and doesn't think he is capable due to his health problems. Would like for me to sign a letter stating this. Says he has decreased concentration as well. Is not able to get adequate sleep due to PTSD. Endorses nightmares and flashbacks. Has chronic health issues, and has lost a lot of family members in the last year. Says he would not like to try any antidepressants or anything else because diazepam  is the only medication that will work for him. 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   Review of Systems  Constitutional:  Positive for fatigue.  Respiratory:  Negative for chest tightness and shortness of breath.   Cardiovascular:  Negative for chest pain.  Psychiatric/Behavioral:  Positive for agitation, decreased concentration and sleep disturbance. Negative for hallucinations, self-injury and suicidal ideas. The patient is nervous/anxious and has insomnia.         Objective:    Today's Vitals   08/14/24 0901 08/14/24 0915  BP: (!) 143/65 (!) 150/98  Pulse: 100   Temp: 98.1 F (36.7 C)   SpO2: 94%   Weight: 248 lb 6.4 oz (112.7 kg)   Height: 5' 9 (1.753 m)    Body mass index is 36.68 kg/m.   Physical Exam Vitals and nursing note reviewed.  Constitutional:      General: He is not in acute distress.    Appearance: Normal appearance. He is not ill-appearing.  Cardiovascular:     Rate and Rhythm: Normal rate and regular rhythm.     Heart sounds: Normal heart sounds, S1 normal and S2 normal. No murmur heard. Pulmonary:     Effort: Pulmonary effort is normal. No respiratory distress.     Breath sounds: Normal breath sounds. No wheezing.  Musculoskeletal:     Right lower leg: No edema.     Left lower leg: No edema.  Neurological:     Mental Status: He is alert.  Psychiatric:        Mood and Affect: Mood is anxious. Affect is blunt, angry and tearful.        Speech: Speech is rapid and pressured.        Behavior: Behavior is agitated.        Thought Content: Thought content does not include homicidal  or suicidal ideation.        Cognition and Memory: Memory is impaired.        Judgment: Judgment normal.       08/14/2024    9:03 AM 07/27/2024   10:49 AM 07/03/2018    2:22 PM 10/24/2017    1:49 PM 01/15/2017   10:55 AM  Depression screen PHQ 2/9  Decreased Interest 3 1 0 0 0  Down, Depressed, Hopeless 0 0 0 0   PHQ - 2 Score 3 1 0 0 0  Altered sleeping 3 2     Tired, decreased energy 2      Change in appetite 0 0     Feeling bad or failure about yourself  0 0     Trouble concentrating 2 0     Moving slowly or fidgety/restless 3 0     Suicidal thoughts 0 0     PHQ-9 Score 13 3     Difficult doing work/chores Somewhat difficult Somewhat difficult          08/14/2024    9:03 AM 07/27/2024   10:50 AM  GAD 7 : Generalized Anxiety Score  Nervous, Anxious, on Edge 3 1  Control/stop worrying 3 0  Worry too much - different things 3  0  Trouble relaxing 3 2  Restless 3 2  Easily annoyed or irritable 3 0  Afraid - awful might happen 0 0  Total GAD 7 Score 18 5  Anxiety Difficulty Somewhat difficult Somewhat difficult   No results found for any visits on 08/14/24.    Assessment & Plan:  1. PTSD (post-traumatic stress disorder) (Primary) -Had lengthy discussion with patient regarding treatment plan.  -Recommended trauma-based therapy, and patient refused.  -Recommended patient to start on SSRI for maintenance of anxiety and depression symptoms, and to decrease need for PRN diazepam . Patient states that he has tried medications in the past, and he does not want to start anything new. Says his diazepam  prescription that he was getting from previous PCP was working, and that's what he wants.  -Advised patient that I would be willing to increase diazepam  dose to 10 mg twice per day in the intent that he follows up with psychiatry for them to take over the prescription. Patient refused psychiatry referral and says that he does not want to be started on any new medications, and cannot afford another specialist. Talked with patient, and he is going to try to find another PCP that will be willing to prescribe him the diazepam  at the dose he wants.  -Advised patient that I would be willing to sign his jury duty papers if he brings them back to the office. Patient agrees.   2. Essential hypertension -Patient hypertensive on exam. Advised patient that it is probably due to increased anxiety. Recommended patient to follow-up about blood pressure with cardiology at this time. Patient agrees.   Discussed patient with Dr. Bluford, and he agrees with the plan.   Damien KATHEE Pringle, FNP  "

## 2024-08-18 ENCOUNTER — Telehealth: Payer: Self-pay

## 2024-08-18 NOTE — Telephone Encounter (Unsigned)
 Copied from CRM [id number]. Topic: General - Other >> Aug 18, 2024  1:04 PM Tiffini S wrote: Reason for CRM: patient received a letter for Damien Pringle stating that changes need to be made from amLODipine  (NORVASC ) 5 MG tablet to Lisinopril- asked that pcp update his other providers about this change  Dennise Capri, MD Nephrology Branch, Dorn FALCON, MD Cardiology Therisa Benton PARAS, NP   Patient asked for a call back at [phone] to discuss

## 2024-08-20 ENCOUNTER — Telehealth: Payer: Self-pay

## 2024-08-20 NOTE — Telephone Encounter (Signed)
 Patient came 08/20/24 to front office and spoke with Dr. Gerard about wanting her to be his PCP again

## 2024-08-20 NOTE — Telephone Encounter (Unsigned)
 Copied from CRM [id number]. Topic: Clinical - Prescription Issue >> Aug 20, 2024  2:58 PM Willma R wrote: Reason for CRM: Patient calling to advise NP Gerard that his kidney dr advised it was okay for his to start taking Lisinopril . Wants to confirm is he should continue taking the amLODipine  (NORVASC ) 5 MG tablet? Also wants to just advise when he needs a refill NP Gerard will need to take over his prescription for fenofibrate 160 MG tablet , doesn't need now just wanted to give a heads up.  Patient can be reached at [phone]

## 2024-08-21 ENCOUNTER — Other Ambulatory Visit: Payer: Self-pay

## 2024-08-21 MED ORDER — LISINOPRIL 10 MG PO TABS: 10.0000 mg | ORAL_TABLET | Freq: Every day | ORAL | 0 refills | Status: AC

## 2024-08-21 NOTE — Progress Notes (Signed)
 Recommended patient to start lisinopril  to protect kidneys at previous visit.  Advised patient to check with nephrology to make sure this is appropriate.  Patient came to front desk to advise me that they agreed with the plan.  Ordered lisinopril .  Patient advised to continue taking amlodipine  as well as previously prescribed.  Advised patient to monitor blood pressure to avoid hypotension.  Gave target blood pressure parameters and advised patient that if blood pressure drops below 100 systolic to let us  know.  Patient to monitor for dizziness or lightheadedness as well.  Meds ordered this encounter  Medications   lisinopril  (ZESTRIL ) 10 MG tablet    Sig: Take 1 tablet (10 mg total) by mouth daily.    Dispense:  90 tablet    Refill:  0

## 2024-08-27 ENCOUNTER — Ambulatory Visit: Attending: Student | Admitting: Student

## 2024-08-27 ENCOUNTER — Encounter: Payer: Self-pay | Admitting: Student

## 2024-08-27 VITALS — BP 120/70 | HR 90 | Ht 69.0 in | Wt 241.2 lb

## 2024-08-27 DIAGNOSIS — I6523 Occlusion and stenosis of bilateral carotid arteries: Secondary | ICD-10-CM | POA: Diagnosis not present

## 2024-08-27 DIAGNOSIS — E782 Mixed hyperlipidemia: Secondary | ICD-10-CM | POA: Diagnosis not present

## 2024-08-27 DIAGNOSIS — I2510 Atherosclerotic heart disease of native coronary artery without angina pectoris: Secondary | ICD-10-CM

## 2024-08-27 DIAGNOSIS — I10 Essential (primary) hypertension: Secondary | ICD-10-CM | POA: Diagnosis not present

## 2024-08-27 MED ORDER — AMLODIPINE BESYLATE 5 MG PO TABS: 5.0000 mg | ORAL_TABLET | Freq: Every day | ORAL | 3 refills | Status: AC

## 2024-08-27 MED ORDER — ROSUVASTATIN CALCIUM 40 MG PO TABS: 40.0000 mg | ORAL_TABLET | Freq: Every day | ORAL | 3 refills | Status: AC

## 2024-08-27 NOTE — Progress Notes (Signed)
 "  Cardiology Office Note    Date:  08/27/2024  ID:  [name], [name] 08/09/1956, MRN [id number] Cardiologist: Alvan Carrier, MD { : History of Present Illness:    [name] is a 69 y.o. male with past medical history of CAD (s/p CABG in 2002, NST in 09/2018 with no ischemia), carotid artery stenosis (s/p left CEA), HTN, HLD, Type II DM, COPD and Stage 3 CKD who presents to the office today for 55-month follow-up.  He was last examined by Dr. Alvan in 01/2024 and denied any recent anginal symptoms at that time. His carotid artery stenosis had been stable by several checks, therefore was recommended to obtain carotid dopplers every 2 years. He was continued on his current cardiac medications with Amlodipine  5 mg daily, ASA 81 mg daily, Fenofibrate 160 mg daily, Losartan  50 mg daily and Crestor  40 mg daily.  In talking with the patient today, he reports things have been stable since his last visit. He has lost 3 family members within the past year but says he is taking things day by day in regards to his grief and anxiety surrounding this. Medical therapy is being adjusted by his PCP. He has baseline dyspnea on exertion which has been occurring since CABG. No acute changes. No recent chest pain, palpitations, orthopnea, PND or pitting edema. Activity is overall limited given chronic back pain.   Studies Reviewed:   EKG: EKG is ordered today and demonstrates:   EKG Interpretation Date/Time:  Thursday August 27 2024 13:06:35 EST Ventricular Rate:  86 PR Interval:  188 QRS Duration:  94 QT Interval:  360 QTC Calculation: 430 R Axis:   30  Text Interpretation: Normal sinus rhythm No acute ST changes. Confirmed by Johnson Grate ([id number]) on 08/27/2024 1:12:52 PM       Echocardiogram: 06/2020 IMPRESSIONS     1. Left ventricular ejection fraction, by estimation, is approximately  55%. The left ventricle has normal function. The left ventricle  demonstrates regional wall  motion abnormalities (see scoring  diagram/findings for description). There is moderate left   ventricular hypertrophy. Left ventricular diastolic parameters are  indeterminate. Definite contrast shows no mural thormbus in region of  apical anteroseptal akinesis.   2. Right ventricular systolic function is normal. The right ventricular  size is normal. Tricuspid regurgitation signal is inadequate for assessing  PA pressure.   3. The mitral valve is grossly normal. Trivial mitral valve  regurgitation.   4. The aortic valve is tricuspid. Aortic valve regurgitation is not  visualized. Mild to moderate aortic valve sclerosis/calcification is  present, without any evidence of aortic stenosis.   5. The inferior vena cava is normal in size with greater than 50%  respiratory variability, suggesting right atrial pressure of 3 mmHg.   Carotid Dopplers: 07/2024 Summary:  Right Carotid: Velocities in the right ICA are consistent with a 40-59%                 stenosis. Non-hemodynamically significant plaque <50% noted  in                the CCA. The ECA appears <50% stenosed.   Left Carotid: Velocities in the left ICA are consistent with a 1-39%  stenosis.               Non-hemodynamically significant plaque <50% noted in the  CCA. ECA                is occluded at orgin with  collateral flow re-constitution.   Vertebrals:  Bilateral vertebral arteries demonstrate antegrade flow.  Subclavians: Normal flow hemodynamics were seen in bilateral subclavian               arteries.   *See table(s) above for measurements and observations.  Suggest follow up study in 12 months.    Physical Exam:   VS:  BP 120/70 (BP Location: Left Arm, Cuff Size: Large)   Pulse 90   Ht 5' 9 (1.753 m)   Wt 241 lb 3.2 oz (109.4 kg)   SpO2 96%   BMI 35.62 kg/m    Wt Readings from Last 3 Encounters:  08/27/24 241 lb 3.2 oz (109.4 kg)  08/14/24 248 lb 6.4 oz (112.7 kg)  07/27/24 253 lb (114.8 kg)     GEN:  Well nourished, well developed male appearing in no acute distress NECK: No JVD; No carotid bruits CARDIAC: RRR, no murmurs, rubs, gallops RESPIRATORY:  Clear to auscultation without rales, wheezing or rhonchi  ABDOMEN: Appears non-distended. No obvious abdominal masses. EXTREMITIES: No clubbing or cyanosis. No pitting edema.  Distal pedal pulses are 2+ bilaterally.   Assessment and Plan:   1. Coronary artery disease involving native coronary artery of native heart without angina pectoris - He previously underwent CABG in 2002 and most recent ischemic evaluation was an NST in 09/2018 which showed no evidence of ischemia. - Continue current medical therapy with ASA 81 mg daily, Fenofibrate 160 mg daily and Crestor  40 mg daily.  2. Carotid stenosis, bilateral - He previously underwent left CEA and most recent carotid dopplers in 07/2024 showed 1 to 39% LICA stenosis and 40 to 59% RICA stenosis. Dr. Alvan previously recommended follow-up imaging every 2 years so will plan for a repeat study in 07/2026.  3. Essential hypertension - BP is well-controlled at 120/70 during today's visit. Continue current medical therapy with Amlodipine  5 mg daily and Lisinopril  10 mg daily  4. Mixed hyperlipidemia - FLP in 10/2023 showed total cholesterol 112, triglycerides 170, HDL 37 and LDL 47. Continue current medical therapy with Crestor  40 mg daily and Fenofibrate 160 mg daily.  Disposition: He prefers to follow-up on an annual basis unless he develops new symptoms in the interim.   Signed, Laymon CHRISTELLA Qua, PA-C   "

## 2024-08-27 NOTE — Patient Instructions (Signed)
 Medication Instructions:  Your physician recommends that you continue on your current medications as directed. Please refer to the Current Medication list given to you today.  *If you need a refill on your cardiac medications before your next appointment, please call your pharmacy*  Lab Work: NONE   If you have labs (blood work) drawn today and your tests are completely normal, you will receive your results only by: MyChart Message (if you have MyChart) OR A paper copy in the mail If you have any lab test that is abnormal or we need to change your treatment, we will call you to review the results.  Testing/Procedures: NONE   Follow-Up: At [facility], you and your health needs are our priority.  As part of our continuing mission to provide you with exceptional heart care, our providers are all part of one team.  This team includes your primary Cardiologist (physician) and Advanced Practice Providers or APPs (Physician Assistants and Nurse Practitioners) who all work together to provide you with the care you need, when you need it.  Your next appointment:   1 year(s)  Provider:   You may see Alvan Carrier, MD or one of the following Advanced Practice Providers on your designated Care Team:   Laymon Qua, PA-C  [place], [place] Olivia [place], [place]     We recommend signing up for the patient portal called MyChart.  Sign up information is provided on this After Visit Summary.  MyChart is used to connect with patients for Virtual Visits (Telemedicine).  Patients are able to view lab/test results, encounter notes, upcoming appointments, etc.  Non-urgent messages can be sent to your provider as well.   To learn more about what you can do with MyChart, go to [email or web address].   Other Instructions Thank you for choosing [facility]!

## 2024-09-02 ENCOUNTER — Other Ambulatory Visit: Payer: Self-pay

## 2024-09-02 DIAGNOSIS — F4310 Post-traumatic stress disorder, unspecified: Secondary | ICD-10-CM

## 2024-09-02 DIAGNOSIS — F419 Anxiety disorder, unspecified: Secondary | ICD-10-CM

## 2024-09-02 NOTE — Telephone Encounter (Signed)
 Left a message for the patient to return call to inform per message below;  Will refill this for this month. Discussed with patient at last visit that I would like him to see psychiatry for them to take over this prescription or we will further decrease the amount he is taking. Please call the patient and remind him.   Thanks, Damien

## 2024-09-03 ENCOUNTER — Ambulatory Visit (INDEPENDENT_AMBULATORY_CARE_PROVIDER_SITE_OTHER)

## 2024-09-03 VITALS — BP 128/74 | HR 104 | Temp 97.7°F | Ht 69.0 in | Wt 239.2 lb

## 2024-09-03 DIAGNOSIS — E1159 Type 2 diabetes mellitus with other circulatory complications: Secondary | ICD-10-CM | POA: Diagnosis not present

## 2024-09-03 DIAGNOSIS — E66812 Obesity, class 2: Secondary | ICD-10-CM

## 2024-09-03 DIAGNOSIS — F419 Anxiety disorder, unspecified: Secondary | ICD-10-CM | POA: Diagnosis not present

## 2024-09-03 DIAGNOSIS — Z794 Long term (current) use of insulin: Secondary | ICD-10-CM | POA: Diagnosis not present

## 2024-09-03 DIAGNOSIS — E1122 Type 2 diabetes mellitus with diabetic chronic kidney disease: Secondary | ICD-10-CM

## 2024-09-03 DIAGNOSIS — N1832 Chronic kidney disease, stage 3b: Secondary | ICD-10-CM

## 2024-09-03 DIAGNOSIS — J449 Chronic obstructive pulmonary disease, unspecified: Secondary | ICD-10-CM | POA: Diagnosis not present

## 2024-09-03 DIAGNOSIS — I10 Essential (primary) hypertension: Secondary | ICD-10-CM

## 2024-09-03 MED ORDER — BUSPIRONE HCL 7.5 MG PO TABS: 7.5000 mg | ORAL_TABLET | Freq: Two times a day (BID) | ORAL | 3 refills | Status: AC

## 2024-09-03 NOTE — Assessment & Plan Note (Signed)
-  Patient currently stable on medication regimen to manage his COPD.  Patient will follow-up with pulmonology if issues come up.

## 2024-09-03 NOTE — Assessment & Plan Note (Signed)
-   Blood pressure generally controlled with current regimen, though systolic occasionally exceeds target of 140 mmHg, especially with activity. Lisinopril  continued for renal protection due to diabetes. No current concerns with lisinopril . - Continue lisinopril  for blood pressure and renal protection. - Continue amlodipine  for blood pressure management. - Monitor blood pressure regularly, target systolic <140 mmHg.

## 2024-09-03 NOTE — Progress Notes (Signed)
 "  Acute Office Visit  Subjective:     Patient ID: [name], male    DOB: 1955/12/29, 69 y.o.   MRN: [id number]  Chief Complaint  Patient presents with   Medication Problem    Pt disagrees with mg change on diazePAM  Pt is biting nails, restless leg syndrome, body is tingly and rambles     HPI Discussed the use of AI scribe software for clinical note transcription with the patient, who gave verbal consent to proceed.  History of Present Illness [name] is a 69 year old male who presents for medication management and blood pressure monitoring.  He is concerned about his diazepam  use, which he takes primarily at night and not daily. He has a history of PTSD and restless leg syndrome, both of which he feels are managed by diazepam . He wants to reduce his diazepam  use due to past withdrawal symptoms experienced when he went a month without it. He has a history of anxiety and has tried various medications, including Lexapro, which he discontinued due to side effects.   He has hypertension and monitors his blood pressure four times a day using a machine. His blood pressure tends to rise with activity. He is currently taking lisinopril  and amlodipine .  Patient brought blood pressure log to today's visit.  He has diabetes and manages it with two types of insulin . He mentions the financial burden of his medications, noting significant out-of-pocket costs.  States that this is the reason he does not want to see a psychiatrist for his Valium , as he already has financial constraints with his current health state.   He describes himself as a 'third shift person', often sleeping late into the day. He feels tired when he first started lisinopril  and experiences a 'creepy' feeling associated with restless leg syndrome, which he attributes to anxiety.   Review of Systems  Constitutional:  Positive for fatigue.  Respiratory:  Negative for cough, chest tightness, shortness of breath and  wheezing.   Cardiovascular:  Negative for chest pain, palpitations and leg swelling.  Psychiatric/Behavioral:  Positive for agitation and sleep disturbance. Negative for confusion, decreased concentration, dysphoric mood, hallucinations, self-injury and suicidal ideas. The patient is nervous/anxious. The patient is not hyperactive.        Objective:    Today's Vitals   09/03/24 0957  BP: 128/74  Pulse: (!) 104  Temp: 97.7 F (36.5 C)  SpO2: 97%  Weight: 239 lb 3.2 oz (108.5 kg)  Height: 5' 9 (1.753 m)   Body mass index is 35.32 kg/m.   Physical Exam Vitals and nursing note reviewed.  Constitutional:      General: He is not in acute distress.    Appearance: Normal appearance. He is not ill-appearing.  Cardiovascular:     Rate and Rhythm: Regular rhythm. Tachycardia present.     Heart sounds: Normal heart sounds, S1 normal and S2 normal. No murmur heard. Pulmonary:     Effort: Pulmonary effort is normal. No respiratory distress.     Breath sounds: Normal breath sounds. No wheezing.  Musculoskeletal:     Right lower leg: No edema.     Left lower leg: No edema.  Neurological:     Mental Status: He is alert.  Psychiatric:        Mood and Affect: Mood is anxious.        Behavior: Behavior is hyperactive.        Thought Content: Thought content normal. Thought content is not  paranoid or delusional. Thought content does not include homicidal or suicidal ideation.        Judgment: Judgment normal.       09/03/2024    9:59 AM 08/14/2024    9:03 AM 07/27/2024   10:49 AM 07/03/2018    2:22 PM 10/24/2017    1:49 PM  Depression screen PHQ 2/9  Decreased Interest 2 3 1  0 0  Down, Depressed, Hopeless 0 0 0 0 0  PHQ - 2 Score 2 3 1  0 0  Altered sleeping 2 3 2     Tired, decreased energy 2 2     Change in appetite 0 0 0    Feeling bad or failure about yourself  0 0 0    Trouble concentrating 0 2 0    Moving slowly or fidgety/restless 0 3 0    Suicidal thoughts 0 0 0    PHQ-9  Score 6 13 3     Difficult doing work/chores  Somewhat difficult Somewhat difficult         09/03/2024    9:59 AM 08/14/2024    9:03 AM 07/27/2024   10:50 AM  GAD 7 : Generalized Anxiety Score  Nervous, Anxious, on Edge 2 3  1    Control/stop worrying 0 3  0   Worry too much - different things 0 3  0   Trouble relaxing 1 3  2    Restless 2 3  2    Easily annoyed or irritable 0 3  0   Afraid - awful might happen 0 0  0   Total GAD 7 Score 5 18 5   Anxiety Difficulty Somewhat difficult Somewhat difficult Somewhat difficult     Data saved with a previous flowsheet row definition   No results found for any visits on 09/03/24.    Assessment & Plan:  1. Anxiety (Primary) - Patient is better today regarding agitation and anxiety. Chronic anxiety and PTSD managed with diazepam . Concerns about dependency and withdrawal. Open to Buspar  trial. Lexapro not tolerated in the past.  Patient states that he is not taking his diazepam  daily and only as needed. - Discussed with patient that agitation and increased restless legs is most likely due to withdrawals of benzos.  Plan to further decrease dose as tolerated. - Prescribed Buspar  7.5 mg twice daily to manage anxiety levels.  - Continue diazepam  as needed, avoid daily use. - Discussed potential psychiatry referral if daily diazepam  use persists.  Patient does not want to go to psychiatry due to financial constraints.  Patient agreeable to continue to wean off of diazepam  at this time.  Advised patient that next prescription will be weaned down further. - busPIRone  (BUSPAR ) 7.5 MG tablet; Take 1 tablet (7.5 mg total) by mouth 2 (two) times daily.  Dispense: 60 tablet; Refill: 3  2. Chronic obstructive pulmonary disease, unspecified COPD type (HCC) -Patient currently stable on medication regimen to manage his COPD.  Patient will follow-up with pulmonology if issues come up.  3. Obesity, morbid (HCC) -Counseled on diet, exercise, and lifestyle  modifications.  4. Type 2 diabetes mellitus with stage 3b chronic kidney disease, with long-term current use of insulin  (HCC) -Will continue following up with endocrinology for the management of his diabetes.  5. Essential hypertension - Blood pressure generally controlled with current regimen, though systolic occasionally exceeds target of 140 mmHg, especially with activity. Lisinopril  continued for renal protection due to diabetes. No current concerns with lisinopril . - Continue lisinopril  for blood pressure and renal  protection. - Continue amlodipine  for blood pressure management. - Monitor blood pressure regularly, target systolic <140 mmHg.   Return in about 3 months (around 12/02/2024).  Damien KATHEE Pringle, FNP  Note:  This document was prepared using Dragon voice recognition software and may include unintentional dictation errors.   "

## 2024-09-03 NOTE — Assessment & Plan Note (Signed)
-   Patient is better today regarding agitation and anxiety. Chronic anxiety and PTSD managed with diazepam . Concerns about dependency and withdrawal. Open to Buspar  trial. Lexapro not tolerated in the past.  Patient states that he is not taking his diazepam  daily and only as needed. - Discussed with patient that agitation and increased restless legs is most likely due to withdrawals of benzos.  Plan to further decrease dose as tolerated. - Prescribed Buspar  7.5 mg twice daily to manage anxiety levels.  - Continue diazepam  as needed, avoid daily use. - Discussed potential psychiatry referral if daily diazepam  use persists.  Patient does not want to go to psychiatry due to financial constraints.  Patient agreeable to continue to wean off of diazepam  at this time.  Advised patient that next prescription will be weaned down further. - busPIRone  (BUSPAR ) 7.5 MG tablet; Take 1 tablet (7.5 mg total) by mouth 2 (two) times daily.  Dispense: 60 tablet; Refill: 3

## 2024-09-03 NOTE — Assessment & Plan Note (Signed)
-  Will continue following up with endocrinology for the management of his diabetes.

## 2024-09-03 NOTE — Assessment & Plan Note (Signed)
-  Counseled on diet, exercise, and lifestyle modifications.

## 2024-09-03 NOTE — Patient Instructions (Signed)
Keep blood pressure less than 140/90

## 2024-09-30 ENCOUNTER — Ambulatory Visit

## 2024-11-18 ENCOUNTER — Ambulatory Visit: Admitting: Nurse Practitioner

## 2024-12-02 ENCOUNTER — Ambulatory Visit
